# Patient Record
Sex: Male | Born: 1960 | Race: White | Hispanic: No | Marital: Single | Smoking: Former smoker
Health system: Southern US, Community
[De-identification: ages and names within clinical notes are randomized; demographics above are authoritative.]

## PROBLEM LIST (undated history)

## (undated) DIAGNOSIS — I10 Essential (primary) hypertension: Secondary | ICD-10-CM

## (undated) DIAGNOSIS — E7800 Pure hypercholesterolemia, unspecified: Secondary | ICD-10-CM

## (undated) DIAGNOSIS — Q2381 Bicuspid aortic valve: Secondary | ICD-10-CM

## (undated) DIAGNOSIS — F32A Depression, unspecified: Secondary | ICD-10-CM

## (undated) DIAGNOSIS — Q231 Congenital insufficiency of aortic valve: Secondary | ICD-10-CM

## (undated) HISTORY — DX: Depression, unspecified: F32.A

## (undated) HISTORY — PX: CARDIAC CATHETERIZATION: SHX172

---

## 2012-06-13 ENCOUNTER — Emergency Department (HOSPITAL_COMMUNITY): Payer: Worker's Compensation

## 2012-06-13 ENCOUNTER — Encounter (HOSPITAL_COMMUNITY): Payer: Self-pay | Admitting: *Deleted

## 2012-06-13 ENCOUNTER — Emergency Department (HOSPITAL_COMMUNITY)
Admission: EM | Admit: 2012-06-13 | Discharge: 2012-06-13 | Disposition: A | Discharge status: Home / Self Care | Payer: Worker's Compensation | Attending: Emergency Medicine | Admitting: Emergency Medicine

## 2012-06-13 DIAGNOSIS — S59909A Unspecified injury of unspecified elbow, initial encounter: Secondary | ICD-10-CM | POA: Insufficient documentation

## 2012-06-13 DIAGNOSIS — S4980XA Other specified injuries of shoulder and upper arm, unspecified arm, initial encounter: Secondary | ICD-10-CM | POA: Insufficient documentation

## 2012-06-13 DIAGNOSIS — W1789XA Other fall from one level to another, initial encounter: Secondary | ICD-10-CM | POA: Insufficient documentation

## 2012-06-13 DIAGNOSIS — Z7982 Long term (current) use of aspirin: Secondary | ICD-10-CM | POA: Insufficient documentation

## 2012-06-13 DIAGNOSIS — Z79899 Other long term (current) drug therapy: Secondary | ICD-10-CM | POA: Insufficient documentation

## 2012-06-13 DIAGNOSIS — Y9389 Activity, other specified: Secondary | ICD-10-CM | POA: Insufficient documentation

## 2012-06-13 DIAGNOSIS — E7800 Pure hypercholesterolemia, unspecified: Secondary | ICD-10-CM | POA: Insufficient documentation

## 2012-06-13 DIAGNOSIS — S6990XA Unspecified injury of unspecified wrist, hand and finger(s), initial encounter: Secondary | ICD-10-CM | POA: Insufficient documentation

## 2012-06-13 DIAGNOSIS — Y9289 Other specified places as the place of occurrence of the external cause: Secondary | ICD-10-CM | POA: Insufficient documentation

## 2012-06-13 DIAGNOSIS — S199XXA Unspecified injury of neck, initial encounter: Secondary | ICD-10-CM | POA: Insufficient documentation

## 2012-06-13 DIAGNOSIS — S32009A Unspecified fracture of unspecified lumbar vertebra, initial encounter for closed fracture: Secondary | ICD-10-CM | POA: Insufficient documentation

## 2012-06-13 DIAGNOSIS — S0993XA Unspecified injury of face, initial encounter: Secondary | ICD-10-CM | POA: Insufficient documentation

## 2012-06-13 DIAGNOSIS — Q231 Congenital insufficiency of aortic valve: Secondary | ICD-10-CM | POA: Insufficient documentation

## 2012-06-13 DIAGNOSIS — S46909A Unspecified injury of unspecified muscle, fascia and tendon at shoulder and upper arm level, unspecified arm, initial encounter: Secondary | ICD-10-CM | POA: Insufficient documentation

## 2012-06-13 DIAGNOSIS — I10 Essential (primary) hypertension: Secondary | ICD-10-CM | POA: Insufficient documentation

## 2012-06-13 HISTORY — DX: Essential (primary) hypertension: I10

## 2012-06-13 HISTORY — DX: Bicuspid aortic valve: Q23.81

## 2012-06-13 HISTORY — DX: Congenital insufficiency of aortic valve: Q23.1

## 2012-06-13 HISTORY — DX: Pure hypercholesterolemia, unspecified: E78.00

## 2012-06-13 LAB — CBC WITH DIFFERENTIAL/PLATELET
Basophils Absolute: 0 10*3/uL (ref 0.0–0.1)
Eosinophils Absolute: 0.1 10*3/uL (ref 0.0–0.7)
Lymphocytes Relative: 13 % (ref 12–46)
Lymphs Abs: 1.1 10*3/uL (ref 0.7–4.0)
Neutrophils Relative %: 81 % — ABNORMAL HIGH (ref 43–77)
Platelets: 210 10*3/uL (ref 150–400)
RBC: 4.21 MIL/uL — ABNORMAL LOW (ref 4.22–5.81)
RDW: 12.6 % (ref 11.5–15.5)
WBC: 8.5 10*3/uL (ref 4.0–10.5)

## 2012-06-13 LAB — POCT I-STAT, CHEM 8
BUN: 33 mg/dL — ABNORMAL HIGH (ref 6–23)
Creatinine, Ser: 1 mg/dL (ref 0.50–1.35)
Hemoglobin: 14.6 g/dL (ref 13.0–17.0)
Potassium: 3.8 mEq/L (ref 3.5–5.1)
Sodium: 138 mEq/L (ref 135–145)

## 2012-06-13 MED ORDER — ONDANSETRON HCL 4 MG/2ML IJ SOLN
4.0000 mg | Freq: Once | INTRAMUSCULAR | Status: AC
  Administered 2012-06-13: 4 mg via INTRAVENOUS
  Filled 2012-06-13: qty 2

## 2012-06-13 MED ORDER — HYDROMORPHONE HCL PF 1 MG/ML IJ SOLN
1.0000 mg | Freq: Once | INTRAMUSCULAR | Status: AC
  Administered 2012-06-13: 1 mg via INTRAMUSCULAR
  Filled 2012-06-13: qty 1

## 2012-06-13 MED ORDER — ONDANSETRON 4 MG PO TBDP
8.0000 mg | ORAL_TABLET | Freq: Once | ORAL | Status: AC
  Administered 2012-06-13: 8 mg via ORAL

## 2012-06-13 MED ORDER — ONDANSETRON 4 MG PO TBDP
ORAL_TABLET | ORAL | Status: AC
  Filled 2012-06-13: qty 2

## 2012-06-13 MED ORDER — ONDANSETRON HCL 4 MG PO TABS: 4.0000 mg | ORAL_TABLET | Freq: Three times a day (TID) | ORAL | Status: DC | PRN

## 2012-06-13 MED ORDER — HYDROMORPHONE HCL PF 1 MG/ML IJ SOLN
1.0000 mg | Freq: Once | INTRAMUSCULAR | Status: AC
  Administered 2012-06-13: 1 mg via INTRAVENOUS
  Filled 2012-06-13: qty 1

## 2012-06-13 MED ORDER — OXYCODONE-ACETAMINOPHEN 5-325 MG PO TABS: 1.0000 | ORAL_TABLET | Freq: Four times a day (QID) | ORAL | Status: DC | PRN

## 2012-06-13 MED ORDER — IOHEXOL 300 MG/ML  SOLN
100.0000 mL | Freq: Once | INTRAMUSCULAR | Status: AC | PRN
  Administered 2012-06-13: 100 mL via INTRAVENOUS

## 2012-06-13 MED ORDER — OXYCODONE-ACETAMINOPHEN 5-325 MG PO TABS: 1.0000 | ORAL_TABLET | ORAL | Status: DC | PRN

## 2012-06-13 NOTE — ED Notes (Signed)
Ortho tech reports brace has to come from [facility] on [address] and will take 30-45 more minutes to arrive. Pt made aware. MD at bedside speaking with pt.

## 2012-06-13 NOTE — ED Notes (Signed)
Patient transported to CT 

## 2012-06-13 NOTE — ED Provider Notes (Signed)
I saw and evaluated the patient, reviewed the resident's note and I agree with the findings and plan.  Derwood Kaplan, MD 06/13/12 1625

## 2012-06-13 NOTE — ED Notes (Signed)
Paged ortho tech 

## 2012-06-13 NOTE — ED Notes (Signed)
Pt given urinal to use.

## 2012-06-13 NOTE — ED Provider Notes (Signed)
History     CSN: [id number]  Arrival date & time 06/13/12  1308   First MD Initiated Contact with Patient 06/13/12 [phone]      Chief Complaint  Patient presents with  . Fall  . Back Pain  . Neck Pain    (Consider location/radiation/quality/duration/timing/severity/associated sxs/prior treatment) Patient is a 51 y.o. male presenting with fall, back pain, and neck pain. The history is provided by the patient.  Fall The accident occurred less than 1 hour ago. Incident: from heavy machinery. He fell from a height of 3 to 5 ft. He landed on concrete. There was no blood loss. The point of impact was the left shoulder and left elbow. The pain is present in the left elbow, left shoulder and neck (chest). The pain is moderate. He was ambulatory at the scene. There was no entrapment after the fall. There was no drug use involved in the accident. There was no alcohol use involved in the accident. Pertinent negatives include no fever, no abdominal pain, no vomiting and no loss of consciousness. Treatment on scene includes a c-collar and a backboard.  Back Pain  Pertinent negatives include no fever and no abdominal pain.  Neck Pain     Past Medical History  Diagnosis Date  . Hypertension   . Bicuspid aortic valve   . Hypercholesteremia     No past surgical history on file.  No family history on file.  History  Substance Use Topics  . Smoking status: Not on file  . Smokeless tobacco: Not on file  . Alcohol Use:       Review of Systems  Constitutional: Negative for fever and chills.  HENT: Positive for neck pain.   Respiratory: Negative for cough and shortness of breath.   Gastrointestinal: Negative for vomiting and abdominal pain.  Musculoskeletal: Positive for back pain.  Neurological: Negative for loss of consciousness.  All other systems reviewed and are negative.    Allergies  Review of patient's allergies indicates no known allergies.  Home Medications   Current  Outpatient Rx  Name  Route  Sig  Dispense  Refill  . AMLODIPINE BESYLATE 5 MG PO TABS   Oral   Take 5 mg by mouth daily.         . ASPIRIN 81 MG PO CHEW   Oral   Chew 81 mg by mouth daily.         Marland Kitchen LORATADINE 10 MG PO TABS   Oral   Take 10 mg by mouth daily.         Marland Kitchen POLYETHYLENE GLYCOL 3350 PO PACK   Oral   Take 17 g by mouth daily.         . QUINAPRIL HCL 20 MG PO TABS   Oral   Take 20 mg by mouth daily.         Marland Kitchen ROPINIROLE HCL 0.5 MG PO TABS   Oral   Take 0.5 mg by mouth at bedtime.           BP 166/104  Pulse 94  Temp 98.4 F (36.9 C) (Oral)  Resp 20  SpO2 100%  Physical Exam  Nursing note and vitals reviewed. Constitutional: He is oriented to person, place, and time. He appears well-developed and well-nourished. No distress.  HENT:  Head: Normocephalic and atraumatic.  Mouth/Throat: No oropharyngeal exudate.  Eyes: EOM are normal. Pupils are equal, round, and reactive to light.  Neck: Normal range of motion. Neck supple.  Cardiovascular: Normal  rate and regular rhythm.  Exam reveals no friction rub.   No murmur heard. Pulmonary/Chest: Effort normal and breath sounds normal. No respiratory distress. He has no wheezes. He has no rales.  Abdominal: He exhibits no distension. There is no tenderness. There is no rebound.  Musculoskeletal: He exhibits no edema.       Left shoulder: He exhibits bony tenderness.       Left elbow: He exhibits decreased range of motion and swelling (mild). tenderness (diffuse) found.       Cervical back: He exhibits bony tenderness (lower c-spine).       Lumbar back: He exhibits tenderness and bony tenderness. He exhibits normal range of motion, no laceration and normal pulse.  Neurological: He is alert and oriented to person, place, and time.  Skin: He is not diaphoretic.    ED Course  Procedures (including critical care time)  Labs Reviewed - No data to display Dg Chest 1 View  06/13/2012  *RADIOLOGY REPORT*   Clinical Data: Posterior left shoulder pain and back pain secondary to a fall.  CHEST - 1 VIEW  Comparison: None.  Findings: The heart size and pulmonary vascularity are normal and the lungs are clear.  No acute osseous abnormality.  IMPRESSION: Normal chest.   Original Report Authenticated By: Francene Boyers, M.D.    Dg Lumbar Spine 2-3 Views  06/13/2012  *RADIOLOGY REPORT*  Clinical Data: Back pain secondary to a fall.  LUMBAR SPINE - 2-3 VIEW  Comparison: CT scan of the abdomen and pelvis dated 09/21/2010  Findings: There is no fracture, subluxation, disc space narrowing, or other abnormality.  IMPRESSION: Normal exam.   Original Report Authenticated By: Francene Boyers, M.D.    Dg Pelvis 1-2 Views  06/13/2012  *RADIOLOGY REPORT*  Clinical Data: Injury post fall  PELVIS - 1-2 VIEW  Comparison: None.  Findings: Single frontal view of the pelvis submitted.  No acute fracture or subluxation.  IMPRESSION: No acute fracture or subluxation.   Original Report Authenticated By: Natasha Mead, M.D.    Dg Elbow 2 Views Left  06/13/2012  *RADIOLOGY REPORT*  Clinical Data: Pain post trauma  LEFT ELBOW - 2 VIEW  Comparison: None.  Findings: Frontal and lateral views were obtained.  No fracture, dislocation, or effusion.  Joint spaces appear intact.  No erosive change.  IMPRESSION: No abnormality noted.   Original Report Authenticated By: Bretta Bang, M.D.    Ct Head Wo Contrast  06/13/2012  *RADIOLOGY REPORT*  Clinical Data: Back and neck pain secondary to a fall.  CT HEAD WITHOUT CONTRAST  Technique:  Contiguous axial images were obtained from the base of the skull through the vertex without contrast.  Comparison: None.  Findings: There is no acute intracranial hemorrhage, infarction, or mass lesion.  Brain parenchyma is normal.  Osseous structures are normal except for chronic mucosal thickening of the right maxillary sinus.  IMPRESSION: No acute abnormalities.   Original Report Authenticated By: Francene Boyers,  M.D.    Ct Chest W Contrast  06/13/2012  *RADIOLOGY REPORT*  Clinical Data:  Fall.  Concern for possible rib fracture.  Back pain.  CT CHEST, ABDOMEN AND PELVIS WITH CONTRAST  Technique:  Multidetector CT imaging of the chest, abdomen and pelvis was performed following the standard protocol during bolus administration of intravenous contrast.  Contrast: OMNIPAQUE IOHEXOL 300 MG/ML  SOLN  Comparison:  Chest radiograph 06/13/2012  CT CHEST  Findings:  Contrast injection was view of the right upper extremity.  The  right subclavian vein demonstrates a narrowed course, and there are collateral vessels which enhance with contrast, in the upper right back.  Superior vena cava appears normal in caliber.  Mild cardiomegaly.  Aortic valvular calcifications are noted.  The patient reportedly has a bicuspid aortic valve per guide PICC. There are stenosis cannot be excluded.  Heart size is mildly enlarged.  Thoracic aorta is normal in caliber and enhancement. Negative for aortic dissection.  The visualized thyroid gland within normal limits.  Negative for lymphadenopathy in the chest.  Esophagus is unremarkable.  Negative for pleural or pericardial effusion.  The trachea and mainstem bronchi are patent.  Negative for pneumothorax.  Mild dependent atelectasis in both lower lobes. Negative for airspace disease, interstitial abnormality, or pulmonary mass.  Soft tissues of the chest wall appears symmetric.  No evidence of chest wall trauma.  The ribs, visualized portions of the scapula and clavicles, and sternum are intact.  The thoracic spine vertebral bodies are normal in height and alignment.  IMPRESSION:  1.  No evidence of acute trauma to the chest. 2.  Aortic valve calcifications.  In combination with the patient's reported history of bicuspid aortic valve, findings are concerning for the possibility of aortic stenosis. 3.  Mild cardiomegaly  CT ABDOMEN AND PELVIS  Findings:  There are acute fractures of the left  transverse processes of L1, L2, L3, and L4.  The lumbar spine vertebral bodies are normal in height and alignment.  The bony pelvis is intact.  There is irregular stranding and fullness in the subcutaneous fat of the lower left flank, just cephalad to the superior aspect of the left iliac crest, consistent with bruising/hematoma.  The liver, gallbladder, spleen, adrenal glands, pancreas, and kidneys show no evidence of acute trauma or mass.  There is a slight rotation anomaly of the kidneys, an anatomic variant.  Both kidneys are normal in size.  There is no hydronephrosis.  The ureters are normal in caliber.  The urinary bladder is moderately distended and demonstrates normal wall thickness.  Prostate gland appears upper normal in contains some calcifications centrally.  The stomach is fairly decompressed and unremarkable.  Small bowel loops are normal in caliber wall thickness.  Prominent amount of stool throughout the colon raises possibility of constipation, suggest clinical correlation.  Rectum unremarkable.  Normal appendix.  Psoas muscles appear normal.  Mesenteries of the bowel are normal.  Abdominal aorta has a tortuous course and is normal in caliber. Scattered foci of atherosclerotic calcification in the aorta and iliac vasculature.  Negative for free air or free fluid.  Anterior abdominal wall is intact.  Inguinal regions unremarkable.  IMPRESSION:  1.  Acute fractures of the left transverse processes of L1, L2, L3, and L4. 2.  Bruising/hematoma of the subcutaneous fat of the inferior left flank, just cephalad to the iliac crest. 3.  No evidence of acute abdominal or pelvic visceral trauma.   Original Report Authenticated By: Britta Mccreedy, M.D.    Ct Cervical Spine Wo Contrast  06/13/2012  *RADIOLOGY REPORT*  Clinical Data: Neck pain secondary to a fall.  CT CERVICAL SPINE WITHOUT CONTRAST  Technique:  Multidetector CT imaging of the cervical spine was performed. Multiplanar CT image reconstructions  were also generated.  Comparison: None.  Findings: There is no fracture, subluxation, prevertebral soft tissue swelling, or other acute abnormality.  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  IMPRESSION: No acute abnormality of the cervical spine.   Original Report Authenticated By: Francene Boyers, M.D.  Ct Abdomen Pelvis W Contrast  06/13/2012  *RADIOLOGY REPORT*  Clinical Data:  Fall.  Concern for possible rib fracture.  Back pain.  CT CHEST, ABDOMEN AND PELVIS WITH CONTRAST  Technique:  Multidetector CT imaging of the chest, abdomen and pelvis was performed following the standard protocol during bolus administration of intravenous contrast.  Contrast: OMNIPAQUE IOHEXOL 300 MG/ML  SOLN  Comparison:  Chest radiograph 06/13/2012  CT CHEST  Findings:  Contrast injection was view of the right upper extremity.  The right subclavian vein demonstrates a narrowed course, and there are collateral vessels which enhance with contrast, in the upper right back.  Superior vena cava appears normal in caliber.  Mild cardiomegaly.  Aortic valvular calcifications are noted.  The patient reportedly has a bicuspid aortic valve per guide PICC. There are stenosis cannot be excluded.  Heart size is mildly enlarged.  Thoracic aorta is normal in caliber and enhancement. Negative for aortic dissection.  The visualized thyroid gland within normal limits.  Negative for lymphadenopathy in the chest.  Esophagus is unremarkable.  Negative for pleural or pericardial effusion.  The trachea and mainstem bronchi are patent.  Negative for pneumothorax.  Mild dependent atelectasis in both lower lobes. Negative for airspace disease, interstitial abnormality, or pulmonary mass.  Soft tissues of the chest wall appears symmetric.  No evidence of chest wall trauma.  The ribs, visualized portions of the scapula and clavicles, and sternum are intact.  The thoracic spine vertebral bodies are normal in height and alignment.  IMPRESSION:  1.  No  evidence of acute trauma to the chest. 2.  Aortic valve calcifications.  In combination with the patient's reported history of bicuspid aortic valve, findings are concerning for the possibility of aortic stenosis. 3.  Mild cardiomegaly  CT ABDOMEN AND PELVIS  Findings:  There are acute fractures of the left transverse processes of L1, L2, L3, and L4.  The lumbar spine vertebral bodies are normal in height and alignment.  The bony pelvis is intact.  There is irregular stranding and fullness in the subcutaneous fat of the lower left flank, just cephalad to the superior aspect of the left iliac crest, consistent with bruising/hematoma.  The liver, gallbladder, spleen, adrenal glands, pancreas, and kidneys show no evidence of acute trauma or mass.  There is a slight rotation anomaly of the kidneys, an anatomic variant.  Both kidneys are normal in size.  There is no hydronephrosis.  The ureters are normal in caliber.  The urinary bladder is moderately distended and demonstrates normal wall thickness.  Prostate gland appears upper normal in contains some calcifications centrally.  The stomach is fairly decompressed and unremarkable.  Small bowel loops are normal in caliber wall thickness.  Prominent amount of stool throughout the colon raises possibility of constipation, suggest clinical correlation.  Rectum unremarkable.  Normal appendix.  Psoas muscles appear normal.  Mesenteries of the bowel are normal.  Abdominal aorta has a tortuous course and is normal in caliber. Scattered foci of atherosclerotic calcification in the aorta and iliac vasculature.  Negative for free air or free fluid.  Anterior abdominal wall is intact.  Inguinal regions unremarkable.  IMPRESSION:  1.  Acute fractures of the left transverse processes of L1, L2, L3, and L4. 2.  Bruising/hematoma of the subcutaneous fat of the inferior left flank, just cephalad to the iliac crest. 3.  No evidence of acute abdominal or pelvic visceral trauma.   Original  Report Authenticated By: Britta Mccreedy, M.D.  Dg Shoulder Left  06/13/2012  *RADIOLOGY REPORT*  Clinical Data: Fall, left shoulder pain  LEFT SHOULDER - 2+ VIEW  Comparison: None.  Findings: Three views of the left shoulder submitted.  No acute fracture or subluxation.  Minimal degenerative changes left AC joint.  IMPRESSION: No acute fracture or subluxation.  Minimal degenerative changes left AC joint.   Original Report Authenticated By: Natasha Mead, M.D.      1. Closed fracture of transverse process of lumbar vertebra       MDM   51 year old male with history of hypertension, bicuspid aortic valve, and hyperlipidemia presents after a fall. Patient fell backwards off machinery onto a concrete floor. He did not lose consciousness his head. He is complaining of left elbow, left shoulder, left upper back, right shoulder, lower back pain. His vitals are stable here. He is not hypoxic. He is in a fair pain. On exam, patient has normal breath sounds his chest. He has mild left-sided chest tenderness. Mild lower c-spine tenderness. He has tenderness to left shoulder left elbow, however no major deformities are noted. He has good pulses in all of his extremities. He has abrasions to that left elbow. He has left posterior shoulder, scapula and lower back pain on palpation. Will be CT scan of his head and neck. The plain films of his chest, left elbow, left shoulder, L. spine, pelvis. Initial x-rays negative. Patient still having abdominal and back pain. Will pursue CT scan of chest abdomen pelvis. The scans showed left-sided the transverse process fractures on the first through fourth lumbar vertebrae. Neurosurgery consult it and recommended lumbar spinal orthotic brace. There is ordered. Patient given pain medicine to go home with and a note off work. Patient given neurosurgery followup in one to 2 weeks.  Elwin Mocha, MD 06/13/12 [phone]

## 2012-06-13 NOTE — ED Notes (Signed)
Pt fell apprximately 3-4 feet at work off of a machinery. Pt fell flat on back. Pt c/o lower back pain and neck pain. No LOC. No blood thinners. Small abrasion to left elbow.

## 2012-06-13 NOTE — Progress Notes (Signed)
Orthopedic Tech Progress Note Patient Details:  Craig Alvarez 1960-08-15 [id number]  Patient ID: Craig Alvarez, male   DOB: 08/05/1960, 51 y.o.   MRN: [id number]   Shawnie Pons 06/13/2012, 2:24 PM CALLED [facility] FOR [facility].

## 2012-06-13 NOTE — ED Notes (Signed)
Patient transported to X-ray 

## 2012-06-13 NOTE — ED Notes (Signed)
Pt taken to restroom to obtain specimen.

## 2012-06-13 NOTE — ED Notes (Signed)
Pt remains in Radiology, will assess pain level when pt returns.

## 2013-07-16 IMAGING — CT CT CERVICAL SPINE W/O CM
2 of 6 series · 5 of 20 positions shown, 6 images · non-contrast
Comparison: None.

CLINICAL DATA: Neck pain secondary to a fall.

CT CERVICAL SPINE WITHOUT CONTRAST
TECHNIQUE: Multidetector CT imaging of the cervical spine was
performed. Multiplanar CT image reconstructions were also
generated.

[Series 5: recon 2: c-spine · axial · 0.23mm/px · z∈[-264,-202]mm · 2 of 76 slices shown, 3 images]
[im 26/76  soft-tissue]
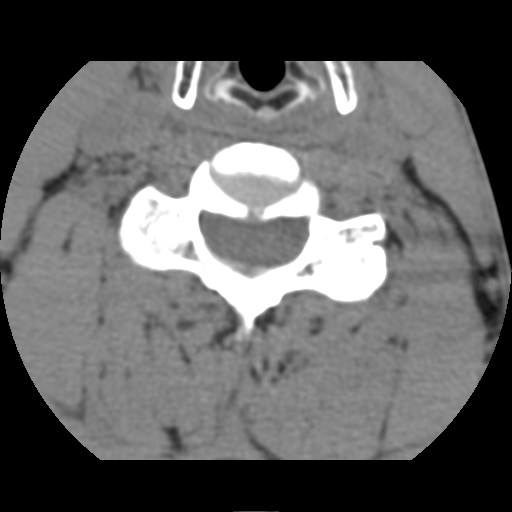
[im 26/76  bone]
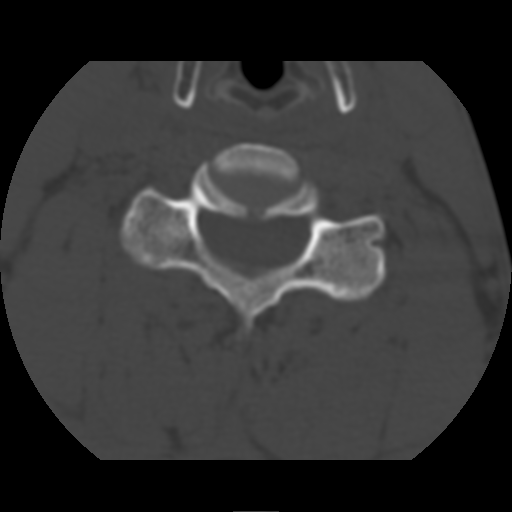
[im 51/76  bone]
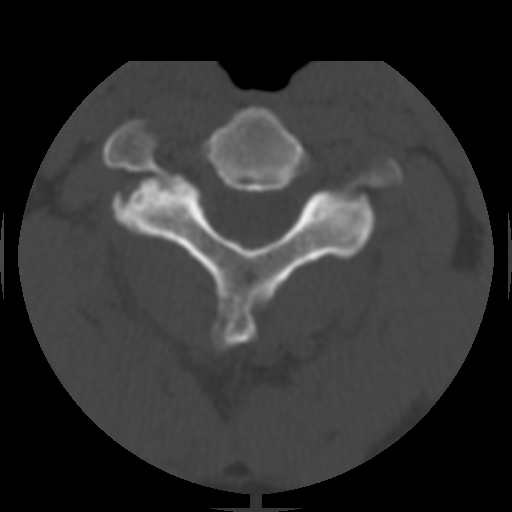

[Series 601: coronals · coronal · 0.38mm/px · 3 of 30 slices shown]
[im 6/30  bone]
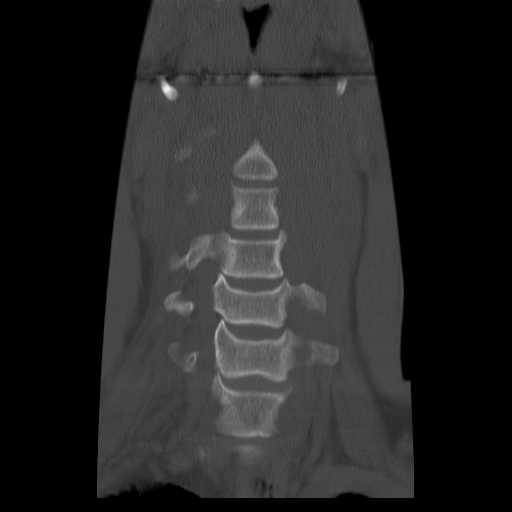
[im 12/30  bone]
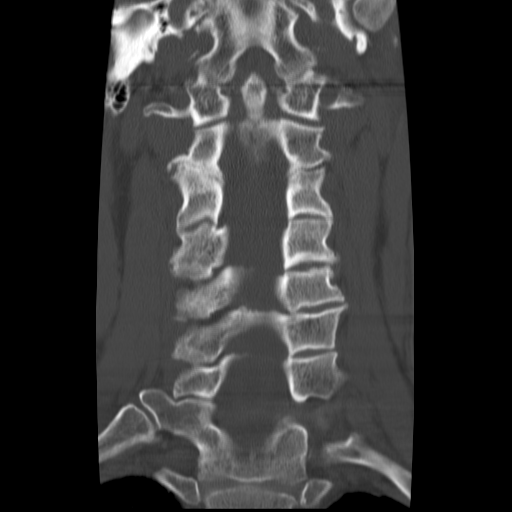
[im 18/30  bone]
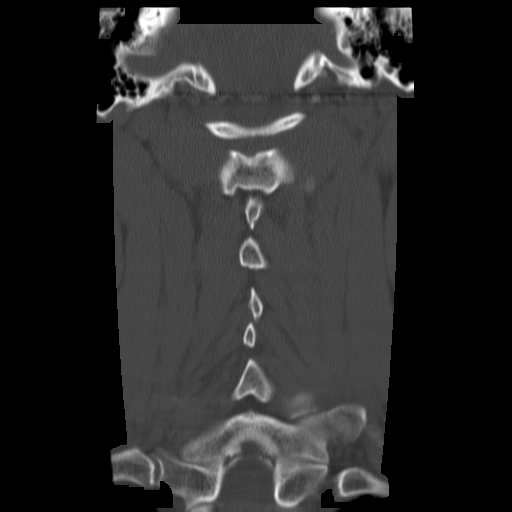

[5 of 20 positions shown; findings below may reference images not displayed]

FINDINGS: There is no fracture, subluxation, prevertebral soft
tissue swelling, or other acute abnormality.  Moderate right facet
arthritis at C2-3.
IMPRESSION: No acute abnormality of the cervical spine.

## 2015-11-04 DIAGNOSIS — R7303 Prediabetes: Secondary | ICD-10-CM | POA: Insufficient documentation

## 2015-11-04 DIAGNOSIS — Z9889 Other specified postprocedural states: Secondary | ICD-10-CM

## 2015-11-04 DIAGNOSIS — N529 Male erectile dysfunction, unspecified: Secondary | ICD-10-CM | POA: Insufficient documentation

## 2015-11-04 HISTORY — DX: Male erectile dysfunction, unspecified: N52.9

## 2015-11-04 HISTORY — DX: Other specified postprocedural states: Z98.890

## 2015-11-04 HISTORY — DX: Prediabetes: R73.03

## 2015-12-21 DIAGNOSIS — I350 Nonrheumatic aortic (valve) stenosis: Secondary | ICD-10-CM

## 2015-12-21 DIAGNOSIS — E785 Hyperlipidemia, unspecified: Secondary | ICD-10-CM

## 2015-12-21 HISTORY — DX: Nonrheumatic aortic (valve) stenosis: I35.0

## 2015-12-21 HISTORY — DX: Hyperlipidemia, unspecified: E78.5

## 2017-12-12 DIAGNOSIS — G8929 Other chronic pain: Secondary | ICD-10-CM

## 2017-12-12 DIAGNOSIS — M545 Low back pain, unspecified: Secondary | ICD-10-CM | POA: Insufficient documentation

## 2017-12-12 HISTORY — DX: Other chronic pain: G89.29

## 2017-12-12 HISTORY — DX: Low back pain, unspecified: M54.50

## 2018-07-10 HISTORY — PX: KNEE ARTHROSCOPY WITH MENISCAL REPAIR: SHX5653

## 2018-11-20 DIAGNOSIS — M25369 Other instability, unspecified knee: Secondary | ICD-10-CM | POA: Insufficient documentation

## 2018-11-20 HISTORY — DX: Other instability, unspecified knee: M25.369

## 2018-12-27 DIAGNOSIS — S83209A Unspecified tear of unspecified meniscus, current injury, unspecified knee, initial encounter: Secondary | ICD-10-CM | POA: Insufficient documentation

## 2018-12-27 HISTORY — DX: Unspecified tear of unspecified meniscus, current injury, unspecified knee, initial encounter: S83.209A

## 2019-01-19 NOTE — Progress Notes (Signed)
Cardiology Office Note:    Date:  01/21/2019   ID:  Craig BashMark Alvarez, DOB 10/04/1960, MRN [id number]  PCP:  Gordan PaymentGrisso, Greg A., MD  Cardiologist:  Norman HerrlichBrian Ovadia Lopp, MD   Referring MD: Gordan PaymentGrisso, Greg A., MD  ASSESSMENT:    1. Pre-operative cardiovascular examination   2. Bicuspid aortic valve   3. Essential hypertension   4. Mixed hyperlipidemia    PLAN:    In order of problems listed above:  1. Pre-operative cardiovascular examination -the planned surgery is elective low risk and by physical exam his aortic stenosis is not severe.  With abnormal EKG I would like him to repeat an echom only with aortic stenosis of critical nature would advise canceling surgery.  He is being seen after 5 PM and were doing our best to get this scheduled for tomorrow. 2. Biscuspid aortic valve - Last echo with mild stenosis 2017.  Continue to follow endocarditis prophylaxis if his aorta is enlarged on echo he will need a CT screen of his chest for aortopathy HTN - Followed by his PCP.  Continue current treatment including calcium channel blocker beta-blocker 3. HLD - Followed by his PCP. Lipid panel 12/20/18 total cholesterol 253, HDL 46, LDL 130, Triglycerides 382.  Stable continue lipid-lowering therapy  Next appointment: 1 year   Medication Adjustments/Labs and Tests Ordered: Current medicines are reviewed at length with the patient today.  Concerns regarding medicines are outlined above.  No orders of the defined types were placed in this encounter.  No orders of the defined types were placed in this encounter.    Chief Complaint  Patient presents with  . Pre-op Exam   Chief Complaint: 58 yo male presents to re-establish cardiac care for pre-operative cardiac clearance of knee surgery in the setting of bicuspid aortic valve.   History of Present Illness:    Craig Alvarez is a 58 y.o. male a history of hypertension hyperlipidemia and a bicuspid aortic valve who is being seen today for preoperative  cardiovascular evaluation prior to arthroscopic knee surgery at the request of Gordan PaymentGrisso, Greg A., MD.  There is a [facility]/06/2016 that echocardiogram showed a bicuspid valve and only mild stenosis.  He is advised to follow endocarditis prophylaxis.   He follows closely with Dr. Shary DecampGrisso for HTN, HLD, and prediabetes. Additional PMH includes anxiety, insomnia, RLS.   Tells me he still takes his prophylactic antiobiotics prior to dental procedures.   He is set for surgery Friday.  From his description is having arthroscopic surgery for torn meniscus.  He is unsure what anesthesia he received.  Despite the limitations of pain from the knee and walking in a brace he still is doing garden work his exercise tolerance exceeds 5-7 METS.  He follows endocarditis prophylaxis and has had no angina dyspnea or syncope.  His physical examination suggest more than mild aortic stenosis and I will do my best to have an echocardiogram done tomorrow although his physical examination does not suggest severe stenosis.  He has had no fever chills or signs of appropriate 19   Past Medical History:  Diagnosis Date  . Bicuspid aortic valve   . Hypercholesteremia   . Hypertension     History reviewed. No pertinent surgical history.  Current Medications: Current Meds  Medication Sig  . amLODipine (NORVASC) 5 MG tablet Take 5 mg by mouth daily.  Marland Kitchen. aspirin 81 MG chewable tablet Chew 81 mg by mouth daily.  Marland Kitchen. atorvastatin (LIPITOR) 20 MG tablet atorvastatin 20  mg tablet  TAKE 1 TABLET BY MOUTH EVERY DAY  . Calcium-Vitamin D 500-125 MG-UNIT TABS Take 1 tablet by mouth daily.  Marland Kitchen loratadine (CLARITIN) 10 MG tablet Take 10 mg by mouth daily.  . Multiple Vitamin (MULTIVITAMIN) tablet Take 1 tablet by mouth daily.  . ondansetron (ZOFRAN) 4 MG tablet Take 1 tablet (4 mg total) by mouth every 8 (eight) hours as needed for nausea.  . polyethylene glycol (MIRALAX / GLYCOLAX) packet Take 17 g by mouth daily.   . quinapril (ACCUPRIL) 20 MG tablet Take 20 mg by mouth daily.  Marland Kitchen rOPINIRole (REQUIP) 0.5 MG tablet Take 0.5 mg by mouth at bedtime.     Allergies:   Patient has no known allergies.   Social History   Socioeconomic History  . Marital status: Single    Spouse name: Not on file  . Number of children: Not on file  . Years of education: Not on file  . Highest education level: Not on file  Occupational History  . Not on file  Social Needs  . Financial resource strain: Not on file  . Food insecurity    Worry: Not on file    Inability: Not on file  . Transportation needs    Medical: Not on file    Non-medical: Not on file  Tobacco Use  . Smoking status: Former Smoker    Types: Cigarettes    Quit date: 1999    Years since quitting: 21.5  . Smokeless tobacco: Never Used  Substance and Sexual Activity  . Alcohol use: Yes    Alcohol/week: 12.0 - 14.0 standard drinks    Types: 12 - 14 Glasses of wine per week  . Drug use: Yes    Types: Marijuana  . Sexual activity: Not on file  Lifestyle  . Physical activity    Days per week: Not on file    Minutes per session: Not on file  . Stress: Not on file  Relationships  . Social Herbalist on phone: Not on file    Gets together: Not on file    Attends religious service: Not on file    Active member of club or organization: Not on file    Attends meetings of clubs or organizations: Not on file    Relationship status: Not on file  Other Topics Concern  . Not on file  Social History Narrative  . Not on file     Family History: The patient's family history includes COPD in his mother; Cancer in his maternal grandmother; Heart attack in his father.  ROS:   ROS Please see the history of present illness.     All other systems reviewed and are negative.  EKGs/Labs/Other Studies Reviewed:    The following studies were reviewed today: No studies available for review.  EKG:  EKG is  ordered today.  The ekg ordered today  is personally reviewed and demonstrates sinus rhythm QS in V2 lead placement versus previous anterior septal MI  Recent Labs: Labs 12/20/18 via Care Everywhere:  A1c 5.7 Glucose 115  Na 137 K 4.2 Creatinine 0.85 AST 31 ALT 45   Recent Lipid Panel Lipid panel 12/20/18 via Care Everywhere: total cholesterol 253, HDL 46, LDL 130, Triglycerides 382.   Physical Exam:    VS:  BP 130/86 (BP Location: Left Arm, Patient Position: Sitting, Cuff Size: Large)   Pulse 98   Ht 6' (1.829 m)   Wt 225 lb (102.1 kg)   SpO2  96%   BMI 30.52 kg/m     Wt Readings from Last 3 Encounters:  01/21/19 225 lb (102.1 kg)     GEN:  Well nourished, well developed in no acute distress HEENT: Normal NECK: No JVD; No carotid bruits LYMPHATICS: No lymphadenopathy CARDIAC: He has a grade 2/6 to 3/6 mid peaking systolic ejection murmur aortic area up into the right clavicle does not radiate into the carotids and S2 is split.  There is no aortic regurgitation.  RRR,  rubs, gallops RESPIRATORY:  Clear to auscultation without rales, wheezing or rhonchi  ABDOMEN: Soft, non-tender, non-distended MUSCULOSKELETAL:  No edema; No deformity  SKIN: Warm and dry NEUROLOGIC:  Alert and oriented x 3 PSYCHIATRIC:  Normal affect     Signed, Norman HerrlichBrian Ellisha Bankson, MD  01/21/2019 5:11 PM     [facility]

## 2019-01-21 ENCOUNTER — Ambulatory Visit (INDEPENDENT_AMBULATORY_CARE_PROVIDER_SITE_OTHER): Payer: 59 | Admitting: Cardiology

## 2019-01-21 ENCOUNTER — Other Ambulatory Visit: Payer: Self-pay

## 2019-01-21 ENCOUNTER — Encounter: Payer: Self-pay | Admitting: Cardiology

## 2019-01-21 VITALS — BP 130/86 | HR 98 | Ht 72.0 in | Wt 225.0 lb

## 2019-01-21 DIAGNOSIS — I10 Essential (primary) hypertension: Secondary | ICD-10-CM

## 2019-01-21 DIAGNOSIS — Q2381 Bicuspid aortic valve: Secondary | ICD-10-CM

## 2019-01-21 DIAGNOSIS — F419 Anxiety disorder, unspecified: Secondary | ICD-10-CM

## 2019-01-21 DIAGNOSIS — E782 Mixed hyperlipidemia: Secondary | ICD-10-CM | POA: Diagnosis not present

## 2019-01-21 DIAGNOSIS — G4700 Insomnia, unspecified: Secondary | ICD-10-CM | POA: Insufficient documentation

## 2019-01-21 DIAGNOSIS — Q231 Congenital insufficiency of aortic valve: Secondary | ICD-10-CM

## 2019-01-21 DIAGNOSIS — G2581 Restless legs syndrome: Secondary | ICD-10-CM

## 2019-01-21 DIAGNOSIS — Z01810 Encounter for preprocedural cardiovascular examination: Secondary | ICD-10-CM

## 2019-01-21 HISTORY — DX: Insomnia, unspecified: G47.00

## 2019-01-21 HISTORY — DX: Restless legs syndrome: G25.81

## 2019-01-21 HISTORY — DX: Anxiety disorder, unspecified: F41.9

## 2019-01-21 HISTORY — DX: Mixed hyperlipidemia: E78.2

## 2019-01-21 HISTORY — DX: Essential (primary) hypertension: I10

## 2019-01-21 NOTE — Patient Instructions (Addendum)
Medication Instructions:  No changes to medication.  If you need a refill on your cardiac medications before your next appointment, please call your pharmacy.   Lab work: None today.  If you have labs (blood work) drawn today and your tests are completely normal, you will receive your results only by: Marland Kitchen MyChart Message (if you have MyChart) OR . A paper copy in the mail If you have any lab test that is abnormal or we need to change your treatment, we will call you to review the results.  Testing/Procedures: Echocardiogram.  Follow-Up: At [facility], you and your health needs are our priority.  As part of our continuing mission to provide you with exceptional heart care, we have created designated Provider Care Teams.  These Care Teams include your primary Cardiologist (physician) and Advanced Practice Providers (APPs -  Physician Assistants and Nurse Practitioners) who all work together to provide you with the care you need, when you need it. . You will need a follow up appointment in 1 year with Dr. Bettina Gavia.   Any Other Special Instructions Will Be Listed Below (If Applicable).  It was a pleasure to meet you today!

## 2019-01-22 ENCOUNTER — Telehealth: Payer: Self-pay | Admitting: *Deleted

## 2019-01-22 ENCOUNTER — Ambulatory Visit (HOSPITAL_COMMUNITY)
Admission: RE | Admit: 2019-01-22 | Discharge: 2019-01-22 | Disposition: A | Discharge status: Home / Self Care | Payer: 59 | Source: Ambulatory Visit | Attending: Cardiology | Admitting: Cardiology

## 2019-01-22 DIAGNOSIS — Q231 Congenital insufficiency of aortic valve: Secondary | ICD-10-CM | POA: Insufficient documentation

## 2019-01-22 DIAGNOSIS — Z01810 Encounter for preprocedural cardiovascular examination: Secondary | ICD-10-CM | POA: Insufficient documentation

## 2019-01-22 NOTE — Telephone Encounter (Signed)
Telephone call to patient. Reached him this time. Informed of echo appointment

## 2019-01-22 NOTE — Telephone Encounter (Signed)
Telephone call to patient . Left message that his echo has been scheduled at [facility] at 3:15 and to go to entrance C. Told to call back to be sure he got the message.

## 2019-01-22 NOTE — Progress Notes (Signed)
  Echocardiogram 2D Echocardiogram has been performed.  Craig Alvarez 01/22/2019, 4:07 PM

## 2020-04-26 DIAGNOSIS — U071 COVID-19: Secondary | ICD-10-CM | POA: Insufficient documentation

## 2020-04-26 DIAGNOSIS — Z8616 Personal history of COVID-19: Secondary | ICD-10-CM

## 2020-04-26 HISTORY — DX: Personal history of COVID-19: Z86.16

## 2020-04-26 HISTORY — DX: COVID-19: U07.1

## 2021-02-13 NOTE — Progress Notes (Deleted)
Cardiology Office Note:    Date:  02/13/2021   ID:  Craig Alvarez, DOB [birth year], MRN [id number]  PCP:  Gordan Payment., MD  Cardiologist:  Norman Herrlich, MD    Referring MD: Gordan Payment., MD    ASSESSMENT:    No diagnosis found. PLAN:    In order of problems listed above:  ***   Next appointment: ***   Medication Adjustments/Labs and Tests Ordered: Current medicines are reviewed at length with the patient today.  Concerns regarding medicines are outlined above.  No orders of the defined types were placed in this encounter.  No orders of the defined types were placed in this encounter.   No chief complaint on file.   History of Present Illness:    Craig Alvarez is a 60 y.o. male with a hx of bicuspid aortic valve last seen 01/21/2019.  There is a notation in cardiology notes 03/05/2016, my partner Dr. Bing Matter that at that time he had mild aortic stenosis mean gradient 17 mmHg Compliance with diet, lifestyle and medications: ***  Is seen today after recent episode of near loss of consciousness.  There is notation is seen by EMS at his home but does not go to the hospital. And labs performed 0874 2022 including magnesium normal 2.3 TSH normal 2.84 CMP with a sodium 139 potassium 4.2 creatinine 0.83 GFR greater than 90 cc hemoglobin 14.4 hematocrit 42.4 platelets 219,000. There is notation of an EKG performed that day no results available in Care Everywhere Past Medical History:  Diagnosis Date   Bicuspid aortic valve    Hypercholesteremia    Hypertension     No past surgical history on file.  Current Medications: No outpatient medications have been marked as taking for the 02/14/21 encounter (Appointment) with Baldo Daub, MD.     Allergies:   Patient has no known allergies.   Social History   Socioeconomic History   Marital status: Single    Spouse name: Not on file   Number of children: Not on file   Years of education: Not on file   Highest  education level: Not on file  Occupational History   Not on file  Tobacco Use   Smoking status: Former    Types: Cigarettes    Quit date: 1999    Years since quitting: 23.6   Smokeless tobacco: Never  Vaping Use   Vaping Use: Never used  Substance and Sexual Activity   Alcohol use: Yes    Alcohol/week: 12.0 - 14.0 standard drinks    Types: 12 - 14 Glasses of wine per week   Drug use: Yes    Types: Marijuana   Sexual activity: Not on file  Other Topics Concern   Not on file  Social History Narrative   Not on file   Social Determinants of Health   Financial Resource Strain: Not on file  Food Insecurity: Not on file  Transportation Needs: Not on file  Physical Activity: Not on file  Stress: Not on file  Social Connections: Not on file     Family History: The patient's ***family history includes COPD in his mother; Cancer in his maternal grandmother; Heart attack in his father. ROS:   Please see the history of present illness.    All other systems reviewed and are negative.  EKGs/Labs/Other Studies Reviewed:    The following studies were reviewed today:  EKG:  EKG ordered today and personally reviewed.  The ekg ordered today demonstrates ***  Recent  Labs: No results found for requested labs within last 8760 hours.  Recent Lipid Panel No results found for: CHOL, TRIG, HDL, CHOLHDL, VLDL, LDLCALC, LDLDIRECT  Physical Exam:    VS:  There were no vitals taken for this visit.    Wt Readings from Last 3 Encounters:  01/21/19 225 lb (102.1 kg)     GEN: *** Well nourished, well developed in no acute distress HEENT: Normal NECK: No JVD; No carotid bruits LYMPHATICS: No lymphadenopathy CARDIAC: ***RRR, no murmurs, rubs, gallops RESPIRATORY:  Clear to auscultation without rales, wheezing or rhonchi  ABDOMEN: Soft, non-tender, non-distended MUSCULOSKELETAL:  No edema; No deformity  SKIN: Warm and dry NEUROLOGIC:  Alert and oriented x 3 PSYCHIATRIC:  Normal affect     Signed, Norman Herrlich, MD  02/13/2021 12:28 PM    [facility]

## 2021-02-14 ENCOUNTER — Ambulatory Visit: Payer: 59 | Admitting: Cardiology

## 2021-02-14 DIAGNOSIS — E7800 Pure hypercholesterolemia, unspecified: Secondary | ICD-10-CM | POA: Insufficient documentation

## 2021-02-15 ENCOUNTER — Encounter: Payer: Self-pay | Admitting: Cardiology

## 2022-04-20 ENCOUNTER — Encounter: Payer: Self-pay | Admitting: Cardiology

## 2022-04-20 ENCOUNTER — Encounter: Payer: Self-pay | Admitting: *Deleted

## 2022-04-21 ENCOUNTER — Ambulatory Visit: Payer: 59 | Attending: Cardiology | Admitting: Cardiology

## 2022-04-21 ENCOUNTER — Encounter: Payer: Self-pay | Admitting: Cardiology

## 2022-04-21 VITALS — BP 110/80 | HR 77 | Ht 72.0 in | Wt 230.0 lb

## 2022-04-21 DIAGNOSIS — I350 Nonrheumatic aortic (valve) stenosis: Secondary | ICD-10-CM | POA: Diagnosis not present

## 2022-04-21 DIAGNOSIS — Q231 Congenital insufficiency of aortic valve: Secondary | ICD-10-CM | POA: Diagnosis not present

## 2022-04-21 DIAGNOSIS — E782 Mixed hyperlipidemia: Secondary | ICD-10-CM

## 2022-04-21 DIAGNOSIS — I10 Essential (primary) hypertension: Secondary | ICD-10-CM

## 2022-04-21 NOTE — Progress Notes (Signed)
Cardiology Office Note:    Date:  04/21/2022   ID:  Craig Alvarez, DOB 1961/06/19, MRN [id number]  PCP:  Raina Mina., MD  Cardiologist:  Shirlee More, MD   Referring MD: Raina Mina., MD  ASSESSMENT:    1. Bicuspid aortic valve   2. Nonrheumatic aortic valve stenosis   3. Essential hypertension   4. Mixed hyperlipidemia    PLAN:    In order of problems listed above:  He has a known bicuspid aortic valve I suspect he has had poor significant progression since 2020 and I am concerned that the symptoms 3 months ago were related to his aortic stenosis we will go ahead and schedule repeat echocardiogram and if severe he would require consideration of valve intervention usually surgical aortic valve replacement.  At his age he would be a candidate for bioprosthetic valve And continue to follow endocarditis prophylaxis Stable blood pressure is well controlled continue ACE inhibitor calcium channel blocker He is on a high intensity statin for lipid-lowering therapy next lipid profile we should check LP(a)  Next appointment set next weeks or sooner depending on the results of his echocardiogram   Medication Adjustments/Labs and Tests Ordered: Current medicines are reviewed at length with the patient today.  Concerns regarding medicines are outlined above.  Orders Placed This Encounter  Procedures   EKG 12-Lead   ECHOCARDIOGRAM COMPLETE   No orders of the defined types were placed in this encounter.  Chief complaint I need a follow-up for my bicuspid aortic valve few months ago is having exertional shortness of breath  History of Present Illness:    Craig Alvarez is a 61 y.o. male with a history of hypertension hyperlipidemia and a bicuspid aortic valve okay who is being seen today for the evaluation of aortic valve disease I cannot see the report of an echocardiogram but his PCP describes on 12/07/2021 that he had a bicuspid aortic valve and had been seen by me in the past.  He  is seen today at the request of Raina Mina., MD.  He saw my partner Dr. Agustin Cree 03/06/2016 his note describes a bicuspid aortic valve with mild stenosis mean gradient of 17 mmHg and he prescribed endocarditis prophylactic antibiotics. In July 2020 prior to orthopedic surgery had a repeat echocardiogram it is in a different storage system I cannot pull the report or review images and were going to work to obtain that study He follows endocarditis prophylaxis with amoxicillin 3 months ago he gained weight and he noticed he is breathless with physical activity is lost in the range of 15 pounds and has had no further symptoms He has had no edema chest pain syncope he has had no mucosal bleeding and no fever or chills His father had unspecified heart disease and died suddenly at age 25 No other family history of valvular heart disease  I was able to work with our technician and bring up his echocardiogram from 2020 out of the storage system the valve is clearly bicuspid appeared to be mildly restricted and he had mild aortic stenosis with mean gradient of 18 mmHg and no aortic regurgitation. Past Medical History:  Diagnosis Date   Acute respiratory disease due to COVID-19 virus 04/26/2020   Anxiety 01/21/2019   Bicuspid aortic valve    Chronic bilateral low back pain without sciatica 12/12/2017   Depression    Dyslipidemia 12/21/2015   ED (erectile dysfunction) 11/04/2015   Essential hypertension 01/21/2019   Hypercholesteremia  Insomnia 01/21/2019   Mixed hyperlipidemia 01/21/2019   Nonrheumatic aortic valve stenosis 12/21/2015   Prediabetes 11/04/2015   Restless legs syndrome (RLS) 01/21/2019   Tear of meniscus of knee 12/27/2018    Past Surgical History:  Procedure Laterality Date   KNEE ARTHROSCOPY WITH MENISCAL REPAIR Right 2020    Current Medications: Current Meds  Medication Sig   ALPRAZolam (XANAX) 1 MG tablet Take 1 mg by mouth daily as needed for anxiety.    amLODipine (NORVASC) 5 MG tablet Take 5 mg by mouth daily.   aspirin 81 MG chewable tablet Chew 81 mg by mouth daily.   atorvastatin (LIPITOR) 20 MG tablet Take 20 mg by mouth daily.   Calcium-Vitamin D 500-125 MG-UNIT TABS Take 1 tablet by mouth daily.   chlorthalidone (HYGROTON) 25 MG tablet Take 25 mg by mouth daily.   eszopiclone (LUNESTA) 2 MG TABS tablet Take 2 mg by mouth at bedtime.   lisinopril (ZESTRIL) 20 MG tablet Take 1 tablet by mouth daily.   Multiple Vitamin (MULTIVITAMIN) tablet Take 1 tablet by mouth daily.   Omega-3 1000 MG CAPS Take 1,000 mg by mouth daily at 6 (six) AM.   rOPINIRole (REQUIP) 0.5 MG tablet Take 0.5 mg by mouth at bedtime.   sildenafil (REVATIO) 20 MG tablet Take 20 mg by mouth daily as needed (erectile dysfunction).     Allergies:   Patient has no known allergies.   Social History   Socioeconomic History   Marital status: Single    Spouse name: Not on file   Number of children: Not on file   Years of education: Not on file   Highest education level: Not on file  Occupational History   Not on file  Tobacco Use   Smoking status: Former    Types: Cigarettes    Quit date: 1999    Years since quitting: 24.7   Smokeless tobacco: Never  Vaping Use   Vaping Use: Never used  Substance and Sexual Activity   Alcohol use: Yes    Alcohol/week: 12.0 - 14.0 standard drinks of alcohol    Types: 12 - 14 Glasses of wine per week   Drug use: Yes    Types: Marijuana   Sexual activity: Not on file  Other Topics Concern   Not on file  Social History Narrative   Not on file   Social Determinants of Health   Financial Resource Strain: Not on file  Food Insecurity: Not on file  Transportation Needs: Not on file  Physical Activity: Not on file  Stress: Not on file  Social Connections: Not on file     Family History: The patient's family history includes COPD in his mother; Cancer in his maternal grandmother; Diabetes in his brother; Heart attack in  his father.  ROS:   ROS Please see the history of present illness.     All other systems reviewed and are negative.  EKGs/Labs/Other Studies Reviewed:    The following studies were reviewed today:   EKG:  EKG is  ordered today.  The ekg ordered today is personally reviewed and demonstrates sinus rhythm voltage criteria for LVH otherwise normal EKG  Recent Labs: 12/07/2021 A1c 5.4% sodium 138 potassium 4.4 creatinine 0.97 Cholesterol 251 LDL 149 triglycerides 294 Physical Exam:    VS:  BP 110/80 (BP Location: Left Arm, Patient Position: Sitting, Cuff Size: Normal)   Pulse 77   Ht 6' (1.829 m)   Wt 230 lb (104.3 kg)   SpO2 97%  BMI 31.19 kg/m     Wt Readings from Last 3 Encounters:  04/21/22 230 lb (104.3 kg)  01/21/19 225 lb (102.1 kg)     GEN: Appears his age well nourished, well developed in no acute distress HEENT: Normal NECK: No JVD; No carotid bruits LYMPHATICS: No lymphadenopathy CARDIAC: Grade 3/6 harsh grunting murmur radiates to the carotids S2 appears single no AR he may have significant or severe aortic stenosis RRR, no murmurs, rubs, gallops RESPIRATORY:  Clear to auscultation without rales, wheezing or rhonchi  ABDOMEN: Soft, non-tender, non-distended MUSCULOSKELETAL:  No edema; No deformity  SKIN: Warm and dry NEUROLOGIC:  Alert and oriented x 3 PSYCHIATRIC:  Normal affect     Signed, Shirlee More, MD  04/21/2022 3:13 PM    [facility]

## 2022-04-21 NOTE — Patient Instructions (Addendum)
Medication Instructions:  Your physician recommends that you continue on your current medications as directed. Please refer to the Current Medication list given to you today.  *If you need a refill on your cardiac medications before your next appointment, please call your pharmacy*   Lab Work: None If you have labs (blood work) drawn today and your tests are completely normal, you will receive your results only by: [place] (if you have MyChart) OR A paper copy in the mail If you have any lab test that is abnormal or we need to change your treatment, we will call you to review the results.   Testing/Procedures: Your physician has requested that you have an echocardiogram. Echocardiography is a painless test that uses sound waves to create images of your heart. It provides your doctor with information about the size and shape of your heart and how well your heart's chambers and valves are working. This procedure takes approximately one hour. There are no restrictions for this procedure. Please do NOT wear cologne, perfume, aftershave, or lotions (deodorant is allowed). Please arrive 15 minutes prior to your appointment time.    Follow-Up: At [facility], you and your health needs are our priority.  As part of our continuing mission to provide you with exceptional heart care, we have created designated Provider Care Teams.  These Care Teams include your primary Cardiologist (physician) and Advanced Practice Providers (APPs -  Physician Assistants and Nurse Practitioners) who all work together to provide you with the care you need, when you need it.  We recommend signing up for the patient portal called "MyChart".  Sign up information is provided on this After Visit Summary.  MyChart is used to connect with patients for Virtual Visits (Telemedicine).  Patients are able to view lab/test results, encounter notes, upcoming appointments, etc.  Non-urgent messages can be sent to your  provider as well.   To learn more about what you can do with MyChart, go to [email or web address].    Your next appointment:   6 week(s)  The format for your next appointment:   In Person  Provider:   Shirlee More, MD    Other Instructions None  Important Information About Sugar

## 2022-04-28 ENCOUNTER — Encounter: Payer: Self-pay | Admitting: Cardiology

## 2022-04-28 ENCOUNTER — Ambulatory Visit: Payer: 59 | Attending: Cardiology

## 2022-04-28 DIAGNOSIS — E782 Mixed hyperlipidemia: Secondary | ICD-10-CM | POA: Diagnosis not present

## 2022-04-28 DIAGNOSIS — I10 Essential (primary) hypertension: Secondary | ICD-10-CM

## 2022-04-28 DIAGNOSIS — Q231 Congenital insufficiency of aortic valve: Secondary | ICD-10-CM

## 2022-04-28 DIAGNOSIS — I350 Nonrheumatic aortic (valve) stenosis: Secondary | ICD-10-CM | POA: Diagnosis not present

## 2022-04-28 LAB — ECHOCARDIOGRAM COMPLETE
AR max vel: 1.22 cm2
AV Area VTI: 1.28 cm2
AV Area mean vel: 1.11 cm2
AV Mean grad: 47.2 mmHg
AV Peak grad: 68.4 mmHg
Ao pk vel: 4.14 m/s
Area-P 1/2: 2.39 cm2
MV M vel: 5.56 m/s
MV Peak grad: 123.7 mmHg
P 1/2 time: 347 msec
S' Lateral: 3.5 cm

## 2022-05-01 ENCOUNTER — Telehealth: Payer: Self-pay

## 2022-05-01 NOTE — Telephone Encounter (Signed)
Patient notified of results and plan.

## 2022-05-01 NOTE — Telephone Encounter (Signed)
-----   Message from Richardo Priest, MD sent at 04/30/2022 11:18 AM EDT ----- As we suspect that the echocardiogram shows that the aortic valve is severely obstructed  He has an appointment to see me in November 17 we will discuss referral for heart catheterization at that time

## 2022-05-24 DIAGNOSIS — F32A Depression, unspecified: Secondary | ICD-10-CM | POA: Insufficient documentation

## 2022-05-26 ENCOUNTER — Ambulatory Visit: Payer: 59 | Attending: Cardiology | Admitting: Cardiology

## 2022-05-26 ENCOUNTER — Encounter: Payer: Self-pay | Admitting: Cardiology

## 2022-05-26 ENCOUNTER — Telehealth: Payer: Self-pay | Admitting: Cardiology

## 2022-05-26 VITALS — BP 144/78 | HR 76 | Ht 72.0 in | Wt 231.0 lb

## 2022-05-26 DIAGNOSIS — I10 Essential (primary) hypertension: Secondary | ICD-10-CM

## 2022-05-26 DIAGNOSIS — Z01812 Encounter for preprocedural laboratory examination: Secondary | ICD-10-CM

## 2022-05-26 DIAGNOSIS — E782 Mixed hyperlipidemia: Secondary | ICD-10-CM | POA: Diagnosis not present

## 2022-05-26 DIAGNOSIS — Q231 Congenital insufficiency of aortic valve: Secondary | ICD-10-CM | POA: Diagnosis not present

## 2022-05-26 DIAGNOSIS — I350 Nonrheumatic aortic (valve) stenosis: Secondary | ICD-10-CM

## 2022-05-26 MED ORDER — FUROSEMIDE 20 MG PO TABS: 20.0000 mg | ORAL_TABLET | Freq: Every day | ORAL | 1 refills | Status: DC

## 2022-05-26 NOTE — Progress Notes (Signed)
Cardiology Office Note:    Date:  05/26/2022   ID:  Craig Alvarez, DOB 11/30/1960, MRN [id number]  PCP:  Gordan Payment., MD  Cardiologist:  Norman Herrlich, MD    Referring MD: Gordan Payment., MD    ASSESSMENT:    1. Nonrheumatic aortic valve stenosis   2. Bicuspid aortic valve   3. Essential hypertension   4. Mixed hyperlipidemia    PLAN:    In order of problems listed above:  Craig Alvarez's aortic stenosis is clearly severe and now symptomatic and I think he has an element of heart failure.  We will go ahead and schedule him for left and right heart catheterization we will start him on a low-dose of a loop diuretic I expect a marked improvement.  I asked to go ahead and schedule a dentist visit he is overdue and gave him a note for work to see if he can get parking closer to the plants and avoid walking uphill.  Could be set up with the structural heart cardiologist. Continue current antihypertensives taking a diuretic should bring his blood pressure into the range Recheck his lipids as well as LP(a) with a bicuspid aortic valve and aortic stenosis   Next appointment: 3 months follow-up for me   Medication Adjustments/Labs and Tests Ordered: Current medicines are reviewed at length with the patient today.  Concerns regarding medicines are outlined above.  No orders of the defined types were placed in this encounter.  No orders of the defined types were placed in this encounter.   Chief Complaint  Patient presents with   Follow-up   Aortic Stenosis    History of Present Illness:    Craig Alvarez is a 61 y.o. male with a hx of known aortic valve disease with a bicuspid aortic valve hypertension hyperlipidemia last seen 04/21/2022.Marland Kitchen  He had developed new exertional shortness of breath Compliance with diet, lifestyle and medications: Yes  Last time I saw him he is subtly short of breath that is no longer subtle.  He is having trouble walking from the parking lot into work because  of being breathless and when he uses his upper extremities has to stop and rest.  He has not had chest pain or syncope.  He does not have orthopnea but on exam he has significant peripheral edema more than I expect to see with amlodipine.  With his shortness of breath and underlying valvular heart disease he will start furosemide 20 mg daily we will check an N-terminal proBNP with his precath labs and directly refer to the left and right heart catheterization in preparation for valvular intervention.  He understands he may require surgical aortic valve replacement with a bicuspid valve.  I set him up with the structural cardiologist to do his left and right heart catheterization.  He is overdue for dental and I asked him to make an appointment including Panorex x-rays.  An echocardiogram performed 04/28/2022.  The aortic valve was bicuspid with calcification and thickening mild aortic regurgitation and severe aortic stenosis and mean gradient 47 mmHg VTI ratio 0 point 6 aortic valve area 1.1 with a large LV outflow tract diameter of 25 mm.  Left ventricular systolic function was low normal 50 to 55% with moderate LVH and moderate left atrial enlargement.  Ascending aorta mildly enlarged 39 mm.  He was previously seen and I was able to work with our technician and bring up his echocardiogram from 2020 out of the storage system the valve is clearly  bicuspid appeared to be mildly restricted and he had mild aortic stenosis with mean gradient of 18 mmHg and no aortic regurgitation.   Past Surgical History:  Procedure Laterality Date   KNEE ARTHROSCOPY WITH MENISCAL REPAIR Right 2020    Current Medications: Current Meds  Medication Sig   ALPRAZolam (XANAX) 1 MG tablet Take 1 mg by mouth daily as needed for anxiety.   amLODipine (NORVASC) 5 MG tablet Take 5 mg by mouth daily.   aspirin 81 MG chewable tablet Chew 81 mg by mouth daily.   atorvastatin (LIPITOR) 20 MG tablet Take 20 mg by mouth daily.    Calcium-Vitamin D 500-125 MG-UNIT TABS Take 1 tablet by mouth daily.   chlorthalidone (HYGROTON) 25 MG tablet Take 25 mg by mouth daily.   eszopiclone (LUNESTA) 2 MG TABS tablet Take 2 mg by mouth at bedtime.   lisinopril (ZESTRIL) 20 MG tablet Take 1 tablet by mouth daily.   Multiple Vitamin (MULTIVITAMIN) tablet Take 1 tablet by mouth daily.   Omega-3 1000 MG CAPS Take 1,000 mg by mouth daily at 6 (six) AM.   rOPINIRole (REQUIP) 0.5 MG tablet Take 0.5 mg by mouth at bedtime.   sildenafil (REVATIO) 20 MG tablet Take 20 mg by mouth daily as needed (erectile dysfunction).     Allergies:   Patient has no known allergies.   Social History   Socioeconomic History   Marital status: Single    Spouse name: Not on file   Number of children: Not on file   Years of education: Not on file   Highest education level: Not on file  Occupational History   Not on file  Tobacco Use   Smoking status: Former    Types: Cigarettes    Quit date: 1999    Years since quitting: 24.8   Smokeless tobacco: Never  Vaping Use   Vaping Use: Never used  Substance and Sexual Activity   Alcohol use: Yes    Alcohol/week: 12.0 - 14.0 standard drinks of alcohol    Types: 12 - 14 Glasses of wine per week   Drug use: Yes    Types: Marijuana   Sexual activity: Not on file  Other Topics Concern   Not on file  Social History Narrative   Not on file   Social Determinants of Health   Financial Resource Strain: Not on file  Food Insecurity: Not on file  Transportation Needs: Not on file  Physical Activity: Not on file  Stress: Not on file  Social Connections: Not on file     Family History: The patient's family history includes COPD in his mother; Cancer in his maternal grandmother; Diabetes in his brother; Heart attack in his father. ROS:   Please see the history of present illness.    All other systems reviewed and are negative.  EKGs/Labs/Other Studies Reviewed:    The following studies were  reviewed today:  total cholesterol 253, HDL 46, LDL 130, Triglycerides 382.     Physical Exam:    VS:  BP (!) 144/78 (BP Location: Right Arm, Patient Position: Sitting)   Pulse 76   Ht 6' (1.829 m)   Wt 231 lb (104.8 kg)   SpO2 96%   BMI 31.33 kg/m     Wt Readings from Last 3 Encounters:  05/26/22 231 lb (104.8 kg)  04/21/22 230 lb (104.3 kg)  01/21/19 225 lb (102.1 kg)     GEN:  Well nourished, well developed in no acute distress he is  mildly breathless in the office HEENT: Normal NECK: No JVD; No carotid bruits LYMPHATICS: No lymphadenopathy CARDIAC: Grade 3/6 holosystolic murmur aortic stenosis encompasses S2 and radiates to the carotids RRR, no murmurs, rubs, gallops RESPIRATORY:  Clear to auscultation without rales, wheezing or rhonchi  ABDOMEN: Soft, non-tender, non-distended MUSCULOSKELETAL: He has 3-4+ lower extremity edema ankle to knee edema; No deformity  SKIN: Warm and dry NEUROLOGIC:  Alert and oriented x 3 PSYCHIATRIC:  Normal affect    Signed, Norman Herrlich, MD  05/26/2022 2:10 PM    [facility]

## 2022-05-26 NOTE — Patient Instructions (Signed)
Medication Instructions:  Your physician has recommended you make the following change in your medication:  Start Furosemide 20 mg once daily Get Dental Visit ASAP with X-rays prior to Valve Correction  *If you need a refill on your cardiac medications before your next appointment, please call your pharmacy*   Lab Work: Your physician recommends that you return for lab work in: Today for a CBC, BMP, ProBNP, Lipid Panel and LPa  If you have labs (blood work) drawn today and your tests are completely normal, you will receive your results only by: MyChart Message (if you have MyChart) OR A paper copy in the mail If you have any lab test that is abnormal or we need to change your treatment, we will call you to review the results.   Testing/Procedures:  [place] A DEPT OF [facility] [place] A DEPT OF [facility] [address] [place] [address] Dept: [phone] Loc: [phone]  Craig Alvarez  05/26/2022  You are scheduled for a Cardiac Catheterization on Tuesday, November 21 with Dr. Bryan Lemma.  1. Please arrive at the [facility] (Main Entrance A) at [facility]: [address] [place], [place] [address] at 8:30 AM (This time is two hours before your procedure to ensure your preparation). Free valet parking service is available.   Special note: Every effort is made to have your procedure done on time. Please understand that emergencies sometimes delay scheduled procedures.  2. Diet: Do not eat solid foods after midnight.  The patient may have clear liquids until 5am upon the day of the procedure.  3. Labs: You will need to have blood drawn on Today  4. Medication instructions in preparation for your procedure:   Contrast Allergy: No    Current Outpatient Medications (Cardiovascular):    amLODipine (NORVASC) 5 MG tablet, Take 5 mg by mouth daily.   atorvastatin (LIPITOR) 20 MG tablet,  Take 20 mg by mouth daily.   chlorthalidone (HYGROTON) 25 MG tablet, Take 25 mg by mouth daily.   furosemide (LASIX) 20 MG tablet, Take 1 tablet (20 mg total) by mouth daily.   lisinopril (ZESTRIL) 20 MG tablet, Take 1 tablet by mouth daily.   sildenafil (REVATIO) 20 MG tablet, Take 20 mg by mouth daily as needed (erectile dysfunction).   Current Outpatient Medications (Analgesics):    aspirin 81 MG chewable tablet, Chew 81 mg by mouth daily.   Current Outpatient Medications (Other):    ALPRAZolam (XANAX) 1 MG tablet, Take 1 mg by mouth daily as needed for anxiety.   Calcium-Vitamin D 500-125 MG-UNIT TABS, Take 1 tablet by mouth daily.   eszopiclone (LUNESTA) 2 MG TABS tablet, Take 2 mg by mouth at bedtime.   Multiple Vitamin (MULTIVITAMIN) tablet, Take 1 tablet by mouth daily.   Omega-3 1000 MG CAPS, Take 1,000 mg by mouth daily at 6 (six) AM.   rOPINIRole (REQUIP) 0.5 MG tablet, Take 0.5 mg by mouth at bedtime. *For reference purposes while preparing patient instructions.   Delete this med list prior to printing instructions for patient.*    Don't take Lisinopril day of procedure      On the morning of your procedure, take your Aspirin 81 mg and any morning medicines NOT listed above.  You may use sips of water.  5. Plan for one night stay--bring personal belongings. 6. Bring a current list of your medications and current insurance cards. 7. You MUST have  a responsible person to drive you home. 8. Someone MUST be with you the first 24 hours after you arrive home or your discharge will be delayed. 9. Please wear clothes that are easy to get on and off and wear slip-on shoes.  Thank you for allowing [place] to care for you!   -- [facility]    Follow-Up: At [facility], you and your health needs are our priority.  As part of our continuing mission to provide you with exceptional heart care, we have created designated Provider Care  Teams.  These Care Teams include your primary Cardiologist (physician) and Advanced Practice Providers (APPs -  Physician Assistants and Nurse Practitioners) who all work together to provide you with the care you need, when you need it.  We recommend signing up for the patient portal called "MyChart".  Sign up information is provided on this After Visit Summary.  MyChart is used to connect with patients for Virtual Visits (Telemedicine).  Patients are able to view lab/test results, encounter notes, upcoming appointments, etc.  Non-urgent messages can be sent to your provider as well.   To learn more about what you can do with MyChart, go to [email or web address].    Your next appointment:   4 week(s)  The format for your next appointment:   In Person  Provider:   Norman Herrlich, MD    Other Instructions   Important Information About Sugar

## 2022-05-26 NOTE — Telephone Encounter (Signed)
Patient states he called [facility] and cancelled 11/21 heart cath because he was notified that he will have to pay upfront and he is waiting for funds to hit his account--FYI.

## 2022-05-29 ENCOUNTER — Telehealth: Payer: Self-pay | Admitting: Cardiology

## 2022-05-29 LAB — CBC WITH DIFFERENTIAL/PLATELET
Basophils Absolute: 0 10*3/uL (ref 0.0–0.2)
Basos: 1 %
EOS (ABSOLUTE): 0.1 10*3/uL (ref 0.0–0.4)
Eos: 3 %
Hematocrit: 40.4 % (ref 37.5–51.0)
Hemoglobin: 13.7 g/dL (ref 13.0–17.7)
Immature Grans (Abs): 0 10*3/uL (ref 0.0–0.1)
Immature Granulocytes: 0 %
Lymphocytes Absolute: 1.4 10*3/uL (ref 0.7–3.1)
Lymphs: 35 %
MCH: 32.4 pg (ref 26.6–33.0)
MCHC: 33.9 g/dL (ref 31.5–35.7)
MCV: 96 fL (ref 79–97)
Monocytes Absolute: 0.4 10*3/uL (ref 0.1–0.9)
Monocytes: 9 %
Neutrophils Absolute: 2.1 10*3/uL (ref 1.4–7.0)
Neutrophils: 52 %
Platelets: 201 10*3/uL (ref 150–450)
RBC: 4.23 x10E6/uL (ref 4.14–5.80)
RDW: 13.3 % (ref 11.6–15.4)
WBC: 4.1 10*3/uL (ref 3.4–10.8)

## 2022-05-29 LAB — LIPID PANEL
Chol/HDL Ratio: 4.6 ratio (ref 0.0–5.0)
Cholesterol, Total: 181 mg/dL (ref 100–199)
HDL: 39 mg/dL — ABNORMAL LOW (ref >39)
LDL Chol Calc (NIH): 99 mg/dL (ref 0–99)
Triglycerides: 252 mg/dL — ABNORMAL HIGH (ref 0–149)
VLDL Cholesterol Cal: 43 mg/dL — ABNORMAL HIGH (ref 5–40)

## 2022-05-29 LAB — BASIC METABOLIC PANEL
BUN/Creatinine Ratio: 32 — ABNORMAL HIGH (ref 10–24)
BUN: 29 mg/dL — ABNORMAL HIGH (ref 8–27)
CO2: 23 mmol/L (ref 20–29)
Calcium: 9.7 mg/dL (ref 8.6–10.2)
Chloride: 103 mmol/L (ref 96–106)
Creatinine, Ser: 0.91 mg/dL (ref 0.76–1.27)
Glucose: 110 mg/dL — ABNORMAL HIGH (ref 70–99)
Potassium: 4.2 mmol/L (ref 3.5–5.2)
Sodium: 139 mmol/L (ref 134–144)
eGFR: 96 mL/min/{1.73_m2} (ref >59)

## 2022-05-29 LAB — PRO B NATRIURETIC PEPTIDE: NT-Pro BNP: 469 pg/mL — ABNORMAL HIGH (ref 0–210)

## 2022-05-29 LAB — LIPOPROTEIN A (LPA): Lipoprotein (a): 155.9 nmol/L — ABNORMAL HIGH (ref <75.0)

## 2022-05-29 NOTE — Telephone Encounter (Signed)
Attempted to call patient. Patient's voice mailbox is not set-up so unable to leave a message.

## 2022-05-29 NOTE — Telephone Encounter (Signed)
Patient wants a call back to clarify if a  test will be included with the cardiac cath procedure.

## 2022-05-29 NOTE — Telephone Encounter (Signed)
Number for billing given to pt. Pt verbalized understanding and had no additional questions.

## 2022-05-29 NOTE — Telephone Encounter (Signed)
Attempted to call patient. Patient's voice mailbox is not set-up so unable to leave a message. 

## 2022-05-29 NOTE — Telephone Encounter (Signed)
Patient returned RN's call. 

## 2022-05-29 NOTE — Addendum Note (Signed)
Addended by: Smiley Houseman B on: 05/29/2022 07:08 AM   Modules accepted: Orders

## 2022-05-30 ENCOUNTER — Ambulatory Visit (HOSPITAL_COMMUNITY): Admission: RE | Admit: 2022-05-30 | Payer: 59 | Source: Home / Self Care | Admitting: Cardiology

## 2022-05-30 ENCOUNTER — Encounter (HOSPITAL_COMMUNITY): Admission: RE | Payer: Self-pay | Source: Home / Self Care

## 2022-05-30 SURGERY — RIGHT/LEFT HEART CATH AND CORONARY ANGIOGRAPHY
Anesthesia: LOCAL

## 2022-05-31 NOTE — Telephone Encounter (Signed)
Called patient to verify that he did not want [place] to reschedule his cardiac cath. Patient stated that he wanted to wait until after the first of the year to reschedule his cardiac cath because then he would have the funds to pay for it. I advised him that if he had any concerns between now and then that he could always call us back. Patient was appreciative for the call and had no further questions at this time.

## 2022-05-31 NOTE — Telephone Encounter (Signed)
Patient returning call.

## 2022-05-31 NOTE — Telephone Encounter (Signed)
Left message for the patient to call back.

## 2022-06-06 ENCOUNTER — Other Ambulatory Visit: Payer: Self-pay

## 2022-06-06 ENCOUNTER — Telehealth: Payer: Self-pay | Admitting: Cardiology

## 2022-06-06 MED ORDER — EZETIMIBE 10 MG PO TABS: 10.0000 mg | ORAL_TABLET | Freq: Every day | ORAL | 3 refills | Status: AC

## 2022-06-06 NOTE — Telephone Encounter (Signed)
Patient is returning call to discuss lab results. 

## 2022-06-06 NOTE — Telephone Encounter (Signed)
Patient informed of results.  

## 2022-06-23 DIAGNOSIS — D649 Anemia, unspecified: Secondary | ICD-10-CM | POA: Insufficient documentation

## 2022-06-23 DIAGNOSIS — D500 Iron deficiency anemia secondary to blood loss (chronic): Secondary | ICD-10-CM

## 2022-06-23 HISTORY — DX: Iron deficiency anemia secondary to blood loss (chronic): D50.0

## 2022-06-23 HISTORY — DX: Anemia, unspecified: D64.9

## 2022-06-26 NOTE — Progress Notes (Unsigned)
Cardiology Office Note:    Date:  06/27/2022   ID:  [name], DOB 24-Jun-1961, MRN [id number]  PCP:  Gordan Payment., MD  Cardiologist:  Norman Herrlich, MD    Referring MD: Gordan Payment., MD    ASSESSMENT:    1. Nonrheumatic aortic valve stenosis   2. Bicuspid aortic valve   3. Hypertensive heart disease with chronic diastolic congestive heart failure (HCC)   4. Mixed hyperlipidemia    PLAN:    In order of problems listed above:  His heart failure is improved proceed left and right heart catheterization in preparation for valvular intervention likely surgical aortic valve replacement. Continue his diuretic along with antihypertensives and statin.   Next appointment: 6 to 8 weeks   Medication Adjustments/Labs and Tests Ordered: Current medicines are reviewed at length with the patient today.  Concerns regarding medicines are outlined above.  No orders of the defined types were placed in this encounter.  No orders of the defined types were placed in this encounter.   Follow-up for aortic stenosis   History of Present Illness:    Craig Alvarez is a 61 y.o. male with a hx of severe and symptomatic aortic stenosis hypertension hyperlipidemia bicuspid aortic valve who deferred undergoing evaluation for intervention with occupation and financial demands when he was last seen 05/26/2022 with heart failure.  He had an echocardiogram performed 04/28/2022. The aortic valve was bicuspid with calcification and thickening mild aortic regurgitation and severe aortic stenosis and mean gradient 47 mmHg VTI aortic valve area 1.1 with a large LV outflow tract diameter of 25 mm. Left ventricular systolic function was low normal 50 to 55% with moderate LVH and moderate left atrial enlargement. Ascending aorta mildly enlarged 39 mm.  Compliance with diet, lifestyle and medications: Yes  He is improved with the low-dose diuretics tolerating edema still struggles at work but is not limited  by shortness of breath no orthopnea chest pain palpitation or syncope Seen by his dentist he had Panorex x-rays and tells me he has no contraindication to surgery no abscess. He has had no fever or chills He wants to proceed with left and right heart catheterization in preparation for aortic valve intervention I told him in his age group and bicuspid aortic valve he be looking at surgical AVR Past Medical History:  Diagnosis Date   Acute respiratory disease due to COVID-19 virus 04/26/2020   Anxiety 01/21/2019   Bicuspid aortic valve    Chronic bilateral low back pain without sciatica 12/12/2017   Depression    Dyslipidemia 12/21/2015   ED (erectile dysfunction) 11/04/2015   Essential hypertension 01/21/2019   Hypercholesteremia    Insomnia 01/21/2019   Mixed hyperlipidemia 01/21/2019   Nonrheumatic aortic valve stenosis 12/21/2015   Prediabetes 11/04/2015   Restless legs syndrome (RLS) 01/21/2019   Tear of meniscus of knee 12/27/2018    Past Surgical History:  Procedure Laterality Date   KNEE ARTHROSCOPY WITH MENISCAL REPAIR Right 2020    Current Medications: No outpatient medications have been marked as taking for the 06/27/22 encounter (Office Visit) with Baldo Daub, MD.     Allergies:   Patient has no known allergies.   Social History   Socioeconomic History   Marital status: Single    Spouse name: Not on file   Number of children: Not on file   Years of education: Not on file   Highest education level: Not on file  Occupational History   Not on file  Tobacco  Use   Smoking status: Former    Types: Cigarettes    Quit date: 1999    Years since quitting: 24.9   Smokeless tobacco: Never  Vaping Use   Vaping Use: Never used  Substance and Sexual Activity   Alcohol use: Yes    Alcohol/week: 12.0 - 14.0 standard drinks of alcohol    Types: 12 - 14 Glasses of wine per week   Drug use: Yes    Types: Marijuana   Sexual activity: Not on file  Other Topics  Concern   Not on file  Social History Narrative   Not on file   Social Determinants of Health   Financial Resource Strain: Not on file  Food Insecurity: Not on file  Transportation Needs: Not on file  Physical Activity: Not on file  Stress: Not on file  Social Connections: Not on file     Family History: The patient's family history includes COPD in his mother; Cancer in his maternal grandmother; Diabetes in his brother; Heart attack in his father. ROS:   Please see the history of present illness.    All other systems reviewed and are negative.  EKGs/Labs/Other Studies Reviewed:    The following studies were reviewed today:   Recent Labs: 05/26/2022: BUN 29; Creatinine, Ser 0.91; Hemoglobin 13.7; NT-Pro BNP 469; Platelets 201; Potassium 4.2; Sodium 139  Recent Lipid Panel    Component Value Date/Time   CHOL 181 05/26/2022 1444   TRIG 252 (H) 05/26/2022 1444   HDL 39 (L) 05/26/2022 1444   CHOLHDL 4.6 05/26/2022 1444   LDLCALC 99 05/26/2022 1444    Physical Exam:    VS:  BP (!) 140/82 (BP Location: Right Arm, Patient Position: Sitting)   Pulse 76   Ht 6' (1.829 m)   Wt 236 lb (107 kg)   SpO2 97%   BMI 32.01 kg/m     Wt Readings from Last 3 Encounters:  06/27/22 236 lb (107 kg)  05/26/22 231 lb (104.8 kg)  04/21/22 230 lb (104.3 kg)     GEN:  Well nourished, well developed in no acute distress HEENT: Normal NECK: No JVD; No carotid bruits LYMPHATICS: No lymphadenopathy CARDIAC: 3/6 holosystolic murmur radiates through the carotids S2 is single no aortic regurgitation RRR, no murmurs, rubs, gallops RESPIRATORY:  Clear to auscultation without rales, wheezing or rhonchi  ABDOMEN: Soft, non-tender, non-distended MUSCULOSKELETAL:  No edema; No deformity  SKIN: Warm and dry NEUROLOGIC:  Alert and oriented x 3 PSYCHIATRIC:  Normal affect    Signed, Norman Herrlich, MD  06/27/2022 12:58 PM    [facility]

## 2022-06-26 NOTE — H&P (View-Only) (Signed)
Cardiology Office Note:    Date:  06/27/2022   ID:  [name], DOB [birth year], MRN [id number]  PCP:  Gordan Payment., MD  Cardiologist:  Norman Herrlich, MD    Referring MD: Gordan Payment., MD    ASSESSMENT:    1. Nonrheumatic aortic valve stenosis   2. Bicuspid aortic valve   3. Hypertensive heart disease with chronic diastolic congestive heart failure (HCC)   4. Mixed hyperlipidemia    PLAN:    In order of problems listed above:  His heart failure is improved proceed left and right heart catheterization in preparation for valvular intervention likely surgical aortic valve replacement. Continue his diuretic along with antihypertensives and statin.   Next appointment: 6 to 8 weeks   Medication Adjustments/Labs and Tests Ordered: Current medicines are reviewed at length with the patient today.  Concerns regarding medicines are outlined above.  No orders of the defined types were placed in this encounter.  No orders of the defined types were placed in this encounter.   Follow-up for aortic stenosis   History of Present Illness:    Craig Alvarez is a 61 y.o. male with a hx of severe and symptomatic aortic stenosis hypertension hyperlipidemia bicuspid aortic valve who deferred undergoing evaluation for intervention with occupation and financial demands when he was last seen 05/26/2022 with heart failure.  He had an echocardiogram performed 04/28/2022. The aortic valve was bicuspid with calcification and thickening mild aortic regurgitation and severe aortic stenosis and mean gradient 47 mmHg VTI aortic valve area 1.1 with a large LV outflow tract diameter of 25 mm. Left ventricular systolic function was low normal 50 to 55% with moderate LVH and moderate left atrial enlargement. Ascending aorta mildly enlarged 39 mm.  Compliance with diet, lifestyle and medications: Yes  He is improved with the low-dose diuretics tolerating edema still struggles at work but is not limited  by shortness of breath no orthopnea chest pain palpitation or syncope Seen by his dentist he had Panore[facility] x-rays and tells me he has no contraindication to surgery no abscess. He has had no fever or chills He wants to proceed with left and right heart catheterization in preparation for aortic valve intervention I told him in his age group and bicuspid aortic valve he be looking at surgical AVR Past Medical History:  Diagnosis Date   Acute respiratory disease due to COVID-19 virus 04/26/2020   Anxiety 01/21/2019   Bicuspid aortic valve    Chronic bilateral low back pain without sciatica 12/12/2017   Depression    Dyslipidemia 12/21/2015   ED (erectile dysfunction) 11/04/2015   Essential hypertension 01/21/2019   Hypercholesteremia    Insomnia 01/21/2019   Mixed hyperlipidemia 01/21/2019   Nonrheumatic aortic valve stenosis 12/21/2015   Prediabetes 11/04/2015   Restless legs syndrome (RLS) 01/21/2019   Tear of meniscus of knee 12/27/2018    Past Surgical History:  Procedure Laterality Date   KNEE ARTHROSCOPY WITH MENISCAL REPAIR Right 2020    Current Medications: No outpatient medications have been marked as taking for the 06/27/22 encounter (Office Visit) with Baldo Daub, MD.     Allergies:   Patient has no known allergies.   Social History   Socioeconomic History   Marital status: Single    Spouse name: Not on file   Number of children: Not on file   Years of education: Not on file   Highest education level: Not on file  Occupational History   Not on file  Tobacco  Use   Smoking status: Former    Types: Cigarettes    Quit date: 1999    Years since quitting: 24.9   Smokeless tobacco: Never  Vaping Use   Vaping Use: Never used  Substance and Sexual Activity   Alcohol use: Yes    Alcohol/week: 12.0 - 14.0 standard drinks of alcohol    Types: 12 - 14 Glasses of wine per week   Drug use: Yes    Types: Marijuana   Sexual activity: Not on file  Other Topics  Concern   Not on file  Social History Narrative   Not on file   Social Determinants of Health   Financial Resource Strain: Not on file  Food Insecurity: Not on file  Transportation Needs: Not on file  Physical Activity: Not on file  Stress: Not on file  Social Connections: Not on file     Family History: The patient's family history includes COPD in his mother; Cancer in his maternal grandmother; Diabetes in his brother; Heart attack in his father. ROS:   Please see the history of present illness.    All other systems reviewed and are negative.  EKGs/Labs/Other Studies Reviewed:    The following studies were reviewed today:   Recent Labs: 05/26/2022: BUN 29; Creatinine, Ser 0.91; Hemoglobin 13.7; NT-Pro BNP 469; Platelets 201; Potassium 4.2; Sodium 139  Recent Lipid Panel    Component Value Date/Time   CHOL 181 05/26/2022 1444   TRIG 252 (H) 05/26/2022 1444   HDL 39 (L) 05/26/2022 1444   CHOLHDL 4.6 05/26/2022 1444   LDLCALC 99 05/26/2022 1444    Physical Exam:    VS:  BP (!) 140/82 (BP Location: Right Arm, Patient Position: Sitting)   Pulse 76   Ht 6' (1.829 m)   Wt 236 lb (107 kg)   SpO2 97%   BMI 32.01 kg/m     Wt Readings from Last 3 Encounters:  06/27/22 236 lb (107 kg)  05/26/22 231 lb (104.8 kg)  04/21/22 230 lb (104.3 kg)     GEN:  Well nourished, well developed in no acute distress HEENT: Normal NECK: No JVD; No carotid bruits LYMPHATICS: No lymphadenopathy CARDIAC: 3/6 holosystolic murmur radiates through the carotids S2 is single no aortic regurgitation RRR, no murmurs, rubs, gallops RESPIRATORY:  Clear to auscultation without rales, wheezing or rhonchi  ABDOMEN: Soft, non-tender, non-distended MUSCULOSKELETAL:  No edema; No deformity  SKIN: Warm and dry NEUROLOGIC:  Alert and oriented x 3 PSYCHIATRIC:  Normal affect    Signed, Norman Herrlich, MD  06/27/2022 12:58 PM    [facility]

## 2022-06-27 ENCOUNTER — Encounter: Payer: Self-pay | Admitting: Cardiology

## 2022-06-27 ENCOUNTER — Ambulatory Visit: Payer: 59 | Attending: Cardiology | Admitting: Cardiology

## 2022-06-27 VITALS — BP 140/82 | HR 76 | Ht 72.0 in | Wt 236.0 lb

## 2022-06-27 DIAGNOSIS — I350 Nonrheumatic aortic (valve) stenosis: Secondary | ICD-10-CM

## 2022-06-27 DIAGNOSIS — E782 Mixed hyperlipidemia: Secondary | ICD-10-CM

## 2022-06-27 DIAGNOSIS — I110 Hypertensive heart disease with heart failure: Secondary | ICD-10-CM

## 2022-06-27 DIAGNOSIS — Z01812 Encounter for preprocedural laboratory examination: Secondary | ICD-10-CM

## 2022-06-27 DIAGNOSIS — Q231 Congenital insufficiency of aortic valve: Secondary | ICD-10-CM

## 2022-06-27 DIAGNOSIS — I5032 Chronic diastolic (congestive) heart failure: Secondary | ICD-10-CM

## 2022-06-27 NOTE — Patient Instructions (Signed)
Medication Instructions:  Your physician recommends that you continue on your current medications as directed. Please refer to the Current Medication list given to you today.  *If you need a refill on your cardiac medications before your next appointment, please call your pharmacy*   Lab Work: Your physician recommends that you return for lab work in: Today for a CBC & BMP  If you have labs (blood work) drawn today and your tests are completely normal, you will receive your results only by: MyChart Message (if you have MyChart) OR A paper copy in the mail If you have any lab test that is abnormal or we need to change your treatment, we will call you to review the results.   Testing/Procedures:  [place] A DEPT OF [facility] [place] A DEPT OF [facility] [address] [place] [address] Dept: [phone] Loc: [phone]  Craig Alvarez  06/27/2022  You are scheduled for a Cardiac Catheterization on Thursday, January 4 with Dr. Alverda Skeans.  1. Please arrive at the [facility] (Main Entrance A) at [facility]: [address] [place], [place] [address] at 10:00 AM (This time is two hours before your procedure to ensure your preparation). Free valet parking service is available.   Special note: Every effort is made to have your procedure done on time. Please understand that emergencies sometimes delay scheduled procedures.  2. Diet: Do not eat solid foods after midnight.  The patient may have clear liquids until 5am upon the day of the procedure.  3. Labs: You will need to have blood drawn on Today  4. Medication instructions in preparation for your procedure:   Contrast Allergy: No    Current Outpatient Medications (Cardiovascular):    ezetimibe (ZETIA) 10 MG tablet, Take 1 tablet (10 mg total) by mouth daily.   amLODipine (NORVASC) 5 MG tablet, Take 5 mg by mouth in the morning.    atorvastatin (LIPITOR) 20 MG tablet, Take 20 mg by mouth every evening.   chlorthalidone (HYGROTON) 25 MG tablet, Take 25 mg by mouth in the morning.   furosemide (LASIX) 20 MG tablet, Take 1 tablet (20 mg total) by mouth daily.   lisinopril (ZESTRIL) 20 MG tablet, Take 20 mg by mouth in the morning.   sildenafil (REVATIO) 20 MG tablet, Take 60-80 mg by mouth daily as needed (erectile dysfunction).   Current Outpatient Medications (Analgesics):    aspirin 81 MG chewable tablet, Chew 81 mg by mouth in the morning.   ibuprofen (ADVIL) 200 MG tablet, Take 600 mg by mouth every 8 (eight) hours as needed (pain.).   Current Outpatient Medications (Other):    ALPRAZolam (XANAX) 1 MG tablet, Take 1 mg by mouth at bedtime.   CALCIUM-VITAMIN D PO, Take 1 tablet by mouth in the morning.   eszopiclone (LUNESTA) 2 MG TABS tablet, Take 2 mg by mouth at bedtime as needed for sleep.   Multiple Vitamin (MULTIVITAMIN) tablet, Take 1 tablet by mouth in the morning.   Omega-3 1000 MG CAPS, Take 1,000 mg by mouth in the morning.   rOPINIRole (REQUIP) 0.5 MG tablet, Take 0.5 mg by mouth at bedtime. *For reference purposes while preparing patient instructions.   Delete this med list prior to printing instructions for patient.*    Stop taking, Lisinopril (Zestril or Prinivil) Thursday, January 4, Hold Furosemide day of testing. No Sildenafil 48 hours prior to test  On the morning of your procedure, take your Aspirin 81 mg and any morning medicines NOT listed above.  You may use sips of water.  5. Plan for one night stay--bring personal belongings. 6. Bring a current list of your medications and current insurance cards. 7. You MUST have a responsible person to drive you home. 8. Someone MUST be with you the first 24 hours after you arrive home or your discharge will be delayed. 9. Please wear clothes that are easy to get on and off and wear slip-on shoes.  Thank you for allowing [place] to care for  you!   -- [facility]    Follow-Up: At [facility], you and your health needs are our priority.  As part of our continuing mission to provide you with exceptional heart care, we have created designated Provider Care Teams.  These Care Teams include your primary Cardiologist (physician) and Advanced Practice Providers (APPs -  Physician Assistants and Nurse Practitioners) who all work together to provide you with the care you need, when you need it.  We recommend signing up for the patient portal called "MyChart".  Sign up information is provided on this After Visit Summary.  MyChart is used to connect with patients for Virtual Visits (Telemedicine).  Patients are able to view lab/test results, encounter notes, upcoming appointments, etc.  Non-urgent messages can be sent to your provider as well.   To learn more about what you can do with MyChart, go to [email or web address].    Your next appointment:   6 week(s)  The format for your next appointment:   In Person  Provider:   Norman Herrlich, MD    Other Instructions   Important Information About Sugar

## 2022-06-28 LAB — CBC WITH DIFFERENTIAL/PLATELET
Basophils Absolute: 0 10*3/uL (ref 0.0–0.2)
Basos: 1 %
EOS (ABSOLUTE): 0.1 10*3/uL (ref 0.0–0.4)
Eos: 1 %
Hematocrit: 39.1 % (ref 37.5–51.0)
Hemoglobin: 13.7 g/dL (ref 13.0–17.7)
Immature Grans (Abs): 0 10*3/uL (ref 0.0–0.1)
Immature Granulocytes: 0 %
Lymphocytes Absolute: 1.4 10*3/uL (ref 0.7–3.1)
Lymphs: 29 %
MCH: 32.6 pg (ref 26.6–33.0)
MCHC: 35 g/dL (ref 31.5–35.7)
MCV: 93 fL (ref 79–97)
Monocytes Absolute: 0.4 10*3/uL (ref 0.1–0.9)
Monocytes: 8 %
Neutrophils Absolute: 2.9 10*3/uL (ref 1.4–7.0)
Neutrophils: 61 %
Platelets: 219 10*3/uL (ref 150–450)
RBC: 4.2 x10E6/uL (ref 4.14–5.80)
RDW: 13.1 % (ref 11.6–15.4)
WBC: 4.8 10*3/uL (ref 3.4–10.8)

## 2022-06-28 LAB — BASIC METABOLIC PANEL
BUN/Creatinine Ratio: 28 — ABNORMAL HIGH (ref 10–24)
BUN: 27 mg/dL (ref 8–27)
CO2: 22 mmol/L (ref 20–29)
Calcium: 9.7 mg/dL (ref 8.6–10.2)
Chloride: 101 mmol/L (ref 96–106)
Creatinine, Ser: 0.96 mg/dL (ref 0.76–1.27)
Glucose: 98 mg/dL (ref 70–99)
Potassium: 4.6 mmol/L (ref 3.5–5.2)
Sodium: 138 mmol/L (ref 134–144)
eGFR: 90 mL/min/{1.73_m2} (ref >59)

## 2022-07-10 DIAGNOSIS — I2510 Atherosclerotic heart disease of native coronary artery without angina pectoris: Secondary | ICD-10-CM

## 2022-07-10 DIAGNOSIS — I6529 Occlusion and stenosis of unspecified carotid artery: Secondary | ICD-10-CM

## 2022-07-10 HISTORY — DX: Atherosclerotic heart disease of native coronary artery without angina pectoris: I25.10

## 2022-07-10 HISTORY — DX: Occlusion and stenosis of unspecified carotid artery: I65.29

## 2022-07-13 ENCOUNTER — Other Ambulatory Visit: Payer: Self-pay

## 2022-07-13 ENCOUNTER — Observation Stay (HOSPITAL_COMMUNITY)
Admission: RE | Admit: 2022-07-13 | Discharge: 2022-07-14 | Disposition: A | Discharge status: Home / Self Care | Payer: 59 | Attending: Internal Medicine | Admitting: Internal Medicine

## 2022-07-13 ENCOUNTER — Encounter (HOSPITAL_COMMUNITY)
Admission: RE | Disposition: A | Discharge status: Home / Self Care | Payer: Self-pay | Source: Home / Self Care | Attending: Internal Medicine

## 2022-07-13 DIAGNOSIS — Z8616 Personal history of COVID-19: Secondary | ICD-10-CM | POA: Diagnosis not present

## 2022-07-13 DIAGNOSIS — I110 Hypertensive heart disease with heart failure: Secondary | ICD-10-CM | POA: Insufficient documentation

## 2022-07-13 DIAGNOSIS — I5032 Chronic diastolic (congestive) heart failure: Secondary | ICD-10-CM | POA: Insufficient documentation

## 2022-07-13 DIAGNOSIS — Z87891 Personal history of nicotine dependence: Secondary | ICD-10-CM | POA: Diagnosis not present

## 2022-07-13 DIAGNOSIS — I2510 Atherosclerotic heart disease of native coronary artery without angina pectoris: Secondary | ICD-10-CM

## 2022-07-13 DIAGNOSIS — Q231 Congenital insufficiency of aortic valve: Secondary | ICD-10-CM | POA: Diagnosis not present

## 2022-07-13 DIAGNOSIS — I350 Nonrheumatic aortic (valve) stenosis: Secondary | ICD-10-CM

## 2022-07-13 DIAGNOSIS — Q230 Congenital stenosis of aortic valve: Secondary | ICD-10-CM | POA: Diagnosis not present

## 2022-07-13 HISTORY — PX: RIGHT HEART CATH AND CORONARY ANGIOGRAPHY: CATH118264

## 2022-07-13 HISTORY — DX: Atherosclerotic heart disease of native coronary artery without angina pectoris: I25.10

## 2022-07-13 LAB — POCT I-STAT EG7
Acid-Base Excess: 1 mmol/L (ref 0.0–2.0)
Acid-Base Excess: 0 mmol/L (ref 0.0–2.0)
Bicarbonate: 26 mmol/L (ref 20.0–28.0)
Bicarbonate: 25.5 mmol/L (ref 20.0–28.0)
Calcium, Ion: 1.28 mmol/L (ref 1.15–1.40)
Calcium, Ion: 1.27 mmol/L (ref 1.15–1.40)
HCT: 36 % — ABNORMAL LOW (ref 39.0–52.0)
HCT: 36 % — ABNORMAL LOW (ref 39.0–52.0)
Hemoglobin: 12.2 g/dL — ABNORMAL LOW (ref 13.0–17.0)
Hemoglobin: 12.2 g/dL — ABNORMAL LOW (ref 13.0–17.0)
O2 Saturation: 72 %
O2 Saturation: 78 %
Potassium: 4.4 mmol/L (ref 3.5–5.1)
Potassium: 4.4 mmol/L (ref 3.5–5.1)
Sodium: 137 mmol/L (ref 135–145)
Sodium: 138 mmol/L (ref 135–145)
TCO2: 27 mmol/L (ref 22–32)
TCO2: 27 mmol/L (ref 22–32)
pCO2, Ven: 44.1 mmHg (ref 44–60)
pCO2, Ven: 43.6 mmHg — ABNORMAL LOW (ref 44–60)
pH, Ven: 7.379 (ref 7.25–7.43)
pH, Ven: 7.375 (ref 7.25–7.43)
pO2, Ven: 39 mmHg (ref 32–45)
pO2, Ven: 44 mmHg (ref 32–45)

## 2022-07-13 LAB — POCT I-STAT 7, (LYTES, BLD GAS, ICA,H+H)
Acid-base deficit: 1 mmol/L (ref 0.0–2.0)
Bicarbonate: 23.7 mmol/L (ref 20.0–28.0)
Calcium, Ion: 1.2 mmol/L (ref 1.15–1.40)
HCT: 35 % — ABNORMAL LOW (ref 39.0–52.0)
Hemoglobin: 11.9 g/dL — ABNORMAL LOW (ref 13.0–17.0)
O2 Saturation: 97 %
Potassium: 4.3 mmol/L (ref 3.5–5.1)
Sodium: 138 mmol/L (ref 135–145)
TCO2: 25 mmol/L (ref 22–32)
pCO2 arterial: 37.3 mmHg (ref 32–48)
pH, Arterial: 7.411 (ref 7.35–7.45)
pO2, Arterial: 89 mmHg (ref 83–108)

## 2022-07-13 LAB — CBC
HCT: 37.8 % — ABNORMAL LOW (ref 39.0–52.0)
Hemoglobin: 13.3 g/dL (ref 13.0–17.0)
MCH: 32.8 pg (ref 26.0–34.0)
MCHC: 35.2 g/dL (ref 30.0–36.0)
MCV: 93.1 fL (ref 80.0–100.0)
Platelets: 206 10*3/uL (ref 150–400)
RBC: 4.06 MIL/uL — ABNORMAL LOW (ref 4.22–5.81)
RDW: 12.9 % (ref 11.5–15.5)
WBC: 4.8 10*3/uL (ref 4.0–10.5)
nRBC: 0 % (ref 0.0–0.2)

## 2022-07-13 LAB — CREATININE, SERUM
Creatinine, Ser: 0.87 mg/dL (ref 0.61–1.24)
GFR, Estimated: >60 mL/min (ref >60)

## 2022-07-13 SURGERY — RIGHT HEART CATH AND CORONARY ANGIOGRAPHY
Anesthesia: LOCAL

## 2022-07-13 MED ORDER — SODIUM CHLORIDE 0.9% FLUSH: 3.0000 mL | INTRAVENOUS | Status: DC | PRN

## 2022-07-13 MED ORDER — ASPIRIN 81 MG PO CHEW: 81.0000 mg | CHEWABLE_TABLET | ORAL | Status: DC

## 2022-07-13 MED ORDER — ACETAMINOPHEN 325 MG PO TABS: 650.0000 mg | ORAL_TABLET | ORAL | Status: DC | PRN

## 2022-07-13 MED ORDER — SODIUM CHLORIDE 0.9 % WEIGHT BASED INFUSION: 1.0000 mL/kg/h | INTRAVENOUS | Status: DC

## 2022-07-13 MED ORDER — SODIUM CHLORIDE 0.9 % IV SOLN: 250.0000 mL | INTRAVENOUS | Status: DC | PRN

## 2022-07-13 MED ORDER — VERAPAMIL HCL 2.5 MG/ML IV SOLN
INTRAVENOUS | Status: AC
  Filled 2022-07-13: qty 2

## 2022-07-13 MED ORDER — ALPRAZOLAM 0.5 MG PO TABS
1.0000 mg | ORAL_TABLET | Freq: Every day | ORAL | Status: DC
  Administered 2022-07-13: 1 mg via ORAL
  Filled 2022-07-13: qty 2

## 2022-07-13 MED ORDER — ONDANSETRON HCL 4 MG/2ML IJ SOLN: 4.0000 mg | Freq: Four times a day (QID) | INTRAMUSCULAR | Status: DC | PRN

## 2022-07-13 MED ORDER — SODIUM CHLORIDE 0.9% FLUSH
3.0000 mL | Freq: Two times a day (BID) | INTRAVENOUS | Status: DC
  Administered 2022-07-13: 3 mL via INTRAVENOUS

## 2022-07-13 MED ORDER — ENOXAPARIN SODIUM 40 MG/0.4ML IJ SOSY
40.0000 mg | PREFILLED_SYRINGE | INTRAMUSCULAR | Status: DC
  Administered 2022-07-13: 40 mg via SUBCUTANEOUS
  Filled 2022-07-13: qty 0.4

## 2022-07-13 MED ORDER — FUROSEMIDE 20 MG PO TABS
20.0000 mg | ORAL_TABLET | Freq: Every day | ORAL | Status: DC
  Administered 2022-07-13: 20 mg via ORAL
  Filled 2022-07-13: qty 1

## 2022-07-13 MED ORDER — HEPARIN SODIUM (PORCINE) 1000 UNIT/ML IJ SOLN
INTRAMUSCULAR | Status: DC | PRN
  Administered 2022-07-13: 5000 [IU] via INTRAVENOUS

## 2022-07-13 MED ORDER — IOHEXOL 350 MG/ML SOLN
INTRAVENOUS | Status: DC | PRN
  Administered 2022-07-13: 55 mL

## 2022-07-13 MED ORDER — HYDRALAZINE HCL 20 MG/ML IJ SOLN: 10.0000 mg | INTRAMUSCULAR | Status: AC | PRN

## 2022-07-13 MED ORDER — FENTANYL CITRATE (PF) 100 MCG/2ML IJ SOLN
INTRAMUSCULAR | Status: AC
  Filled 2022-07-13: qty 2

## 2022-07-13 MED ORDER — ASPIRIN 81 MG PO TBEC: 81.0000 mg | DELAYED_RELEASE_TABLET | Freq: Every day | ORAL | Status: DC

## 2022-07-13 MED ORDER — SODIUM CHLORIDE 0.9% FLUSH: 3.0000 mL | Freq: Two times a day (BID) | INTRAVENOUS | Status: DC

## 2022-07-13 MED ORDER — LIDOCAINE HCL (PF) 1 % IJ SOLN
INTRAMUSCULAR | Status: DC | PRN
  Administered 2022-07-13: 5 mL

## 2022-07-13 MED ORDER — LIDOCAINE HCL (PF) 1 % IJ SOLN
INTRAMUSCULAR | Status: AC
  Filled 2022-07-13: qty 30

## 2022-07-13 MED ORDER — HEPARIN SODIUM (PORCINE) 1000 UNIT/ML IJ SOLN
INTRAMUSCULAR | Status: AC
  Filled 2022-07-13: qty 10

## 2022-07-13 MED ORDER — AMLODIPINE BESYLATE 5 MG PO TABS
5.0000 mg | ORAL_TABLET | Freq: Every morning | ORAL | Status: DC
  Administered 2022-07-14: 5 mg via ORAL
  Filled 2022-07-13: qty 1

## 2022-07-13 MED ORDER — SODIUM CHLORIDE 0.9 % IV SOLN: INTRAVENOUS | Status: AC

## 2022-07-13 MED ORDER — LABETALOL HCL 5 MG/ML IV SOLN: 10.0000 mg | INTRAVENOUS | Status: AC | PRN

## 2022-07-13 MED ORDER — SODIUM CHLORIDE 0.9 % IV SOLN: INTRAVENOUS | Status: DC

## 2022-07-13 MED ORDER — NITROGLYCERIN 0.4 MG SL SUBL: 0.4000 mg | SUBLINGUAL_TABLET | SUBLINGUAL | Status: DC | PRN

## 2022-07-13 MED ORDER — SODIUM CHLORIDE 0.9 % WEIGHT BASED INFUSION
3.0000 mL/kg/h | INTRAVENOUS | Status: DC
  Administered 2022-07-13: 3 mL/kg/h via INTRAVENOUS

## 2022-07-13 MED ORDER — ROPINIROLE HCL 0.5 MG PO TABS
0.5000 mg | ORAL_TABLET | Freq: Every day | ORAL | Status: DC
  Administered 2022-07-13: 0.5 mg via ORAL
  Filled 2022-07-13: qty 1

## 2022-07-13 MED ORDER — EZETIMIBE 10 MG PO TABS: 10.0000 mg | ORAL_TABLET | Freq: Every day | ORAL | Status: DC

## 2022-07-13 MED ORDER — HEPARIN (PORCINE) IN NACL 1000-0.9 UT/500ML-% IV SOLN
INTRAVENOUS | Status: DC | PRN
  Administered 2022-07-13 (×2): 500 mL

## 2022-07-13 MED ORDER — FENTANYL CITRATE (PF) 100 MCG/2ML IJ SOLN
INTRAMUSCULAR | Status: DC | PRN
  Administered 2022-07-13: 25 ug via INTRAVENOUS

## 2022-07-13 MED ORDER — MIDAZOLAM HCL 2 MG/2ML IJ SOLN
INTRAMUSCULAR | Status: AC
  Filled 2022-07-13: qty 2

## 2022-07-13 MED ORDER — MIDAZOLAM HCL 2 MG/2ML IJ SOLN
INTRAMUSCULAR | Status: DC | PRN
  Administered 2022-07-13: 1 mg via INTRAVENOUS

## 2022-07-13 MED ORDER — VERAPAMIL HCL 2.5 MG/ML IV SOLN
INTRAVENOUS | Status: DC | PRN
  Administered 2022-07-13: 10 mL via INTRA_ARTERIAL

## 2022-07-13 MED ORDER — HEPARIN (PORCINE) IN NACL 1000-0.9 UT/500ML-% IV SOLN
INTRAVENOUS | Status: AC
  Filled 2022-07-13: qty 1000

## 2022-07-13 MED ORDER — LISINOPRIL 20 MG PO TABS
20.0000 mg | ORAL_TABLET | Freq: Every morning | ORAL | Status: DC
  Administered 2022-07-14: 20 mg via ORAL
  Filled 2022-07-13: qty 1

## 2022-07-13 MED ORDER — ATORVASTATIN CALCIUM 10 MG PO TABS
20.0000 mg | ORAL_TABLET | Freq: Every evening | ORAL | Status: DC
  Administered 2022-07-13: 20 mg via ORAL
  Filled 2022-07-13: qty 2

## 2022-07-13 MED ORDER — CHLORTHALIDONE 25 MG PO TABS
25.0000 mg | ORAL_TABLET | Freq: Every morning | ORAL | Status: DC
  Administered 2022-07-14: 25 mg via ORAL
  Filled 2022-07-13: qty 1

## 2022-07-13 SURGICAL SUPPLY — 11 items
CATH DIAG 6FR JR4 (CATHETERS) IMPLANT
CATH OPTITORQUE TIG 4.0 6F (CATHETERS) IMPLANT
CATH SWAN GANZ 7F STRAIGHT (CATHETERS) IMPLANT
DEVICE RAD COMP TR BAND LRG (VASCULAR PRODUCTS) IMPLANT
GLIDESHEATH SLEND SS 6F .021 (SHEATH) IMPLANT
GLIDESHEATH SLENDER 7FR .021G (SHEATH) IMPLANT
KIT HEART LEFT (KITS) ×1 IMPLANT
PACK CARDIAC CATHETERIZATION (CUSTOM PROCEDURE TRAY) ×1 IMPLANT
TRANSDUCER W/STOPCOCK (MISCELLANEOUS) ×1 IMPLANT
TUBING CIL FLEX 10 FLL-RA (TUBING) ×1 IMPLANT
WIRE EMERALD 3MM-J .035X150CM (WIRE) IMPLANT

## 2022-07-13 NOTE — Interval H&P Note (Signed)
History and Physical Interval Note:  07/13/2022 11:43 AM  Craig Alvarez  has presented today for surgery, with the diagnosis of Aortic stenosis.  The various methods of treatment have been discussed with the patient and family. After consideration of risks, benefits and other options for treatment, the patient has consented to  Procedure(s): RIGHT/LEFT HEART CATH AND CORONARY ANGIOGRAPHY (N/A) as a surgical intervention.  The patient's history has been reviewed, patient examined, no change in status, stable for surgery.  I have reviewed the patient's chart and labs.  Questions were answered to the patient's satisfaction.     Early Osmond

## 2022-07-13 NOTE — Progress Notes (Signed)
TR BAND REMOVAL  LOCATION:    Right radial  DEFLATED PER PROTOCOL:    Yes.    TIME BAND OFF / DRESSING APPLIED:    1620pm a clean dry dressing applied with gauze and tegaderm   SITE UPON ARRIVAL:    Level 0  SITE AFTER BAND REMOVAL:    Level 0  CIRCULATION SENSATION AND MOVEMENT:    Within Normal Limits   Yes.    COMMENTS:   Care instructions given to patient

## 2022-07-14 ENCOUNTER — Encounter (HOSPITAL_COMMUNITY): Payer: Self-pay | Admitting: Internal Medicine

## 2022-07-14 ENCOUNTER — Telehealth: Payer: Self-pay | Admitting: Cardiology

## 2022-07-14 DIAGNOSIS — I2510 Atherosclerotic heart disease of native coronary artery without angina pectoris: Secondary | ICD-10-CM | POA: Diagnosis not present

## 2022-07-14 DIAGNOSIS — I350 Nonrheumatic aortic (valve) stenosis: Secondary | ICD-10-CM | POA: Diagnosis not present

## 2022-07-14 DIAGNOSIS — I110 Hypertensive heart disease with heart failure: Secondary | ICD-10-CM | POA: Diagnosis not present

## 2022-07-14 DIAGNOSIS — Z8616 Personal history of COVID-19: Secondary | ICD-10-CM | POA: Diagnosis not present

## 2022-07-14 DIAGNOSIS — I5032 Chronic diastolic (congestive) heart failure: Secondary | ICD-10-CM | POA: Diagnosis not present

## 2022-07-14 LAB — CBC
HCT: 38.1 % — ABNORMAL LOW (ref 39.0–52.0)
Hemoglobin: 13.4 g/dL (ref 13.0–17.0)
MCH: 32.8 pg (ref 26.0–34.0)
MCHC: 35.2 g/dL (ref 30.0–36.0)
MCV: 93.2 fL (ref 80.0–100.0)
Platelets: 212 10*3/uL (ref 150–400)
RBC: 4.09 MIL/uL — ABNORMAL LOW (ref 4.22–5.81)
RDW: 12.9 % (ref 11.5–15.5)
WBC: 6.1 10*3/uL (ref 4.0–10.5)
nRBC: 0 % (ref 0.0–0.2)

## 2022-07-14 LAB — BASIC METABOLIC PANEL
Anion gap: 10 (ref 5–15)
BUN: 20 mg/dL (ref 8–23)
CO2: 26 mmol/L (ref 22–32)
Calcium: 9.4 mg/dL (ref 8.9–10.3)
Chloride: 101 mmol/L (ref 98–111)
Creatinine, Ser: 0.94 mg/dL (ref 0.61–1.24)
GFR, Estimated: >60 mL/min (ref >60)
Glucose, Bld: 97 mg/dL (ref 70–99)
Potassium: 3.8 mmol/L (ref 3.5–5.1)
Sodium: 137 mmol/L (ref 135–145)

## 2022-07-14 MED ORDER — ENOXAPARIN SODIUM 60 MG/0.6ML IJ SOSY: 0.5000 mg/kg | PREFILLED_SYRINGE | INTRAMUSCULAR | Status: DC

## 2022-07-14 NOTE — Telephone Encounter (Signed)
Patient stated he had procedure and will need a doctor's note to be out of work for at least two weeks plus recovery time after his surgery.

## 2022-07-14 NOTE — Plan of Care (Signed)
Pt is progressing 

## 2022-07-14 NOTE — Telephone Encounter (Signed)
Letter completed. Pt states that he has an appointment with the surgeon on 07/21/22. Pt states that he will have his FMLA papers sent to Dr. Bettina Gavia.

## 2022-07-14 NOTE — Discharge Summary (Addendum)
Discharge Summary    Patient ID: Craig Alvarez MRN: [id number]; DOB: [birth year]  Admit date: 07/13/2022 Discharge date: 07/14/2022  PCP:  Raina Mina., MD   [place] Providers Cardiologist:  None        Discharge Diagnoses    Principal Problem:   Coronary artery disease    Diagnostic Studies/Procedures    07/14/21 LHC    Mid LM lesion is 20% stenosed.   1.  Minimal obstructive coronary artery disease. 2.  Fick cardiac output of 8.5 L/min and Fick cardiac index of 3.7 L/min/m with mean RA pressure of 9 mmHg, mean wedge pressure of 15 mmHg, and mean PA pressure of 23 mmHg.   Recommendation: Cardiothoracic surgical evaluation  Diagnostic Dominance: Right  _____________   History of Present Illness     Craig Alvarez is a 62 y.o. male with hx of severe and symptomatic aortic stenosis hypertension hyperlipidemia bicuspid aortic valve. Patient deferred undergoing evaluation for intervention with occupation and financial demands when he was last seen 05/26/2022 with heart failure. He had an echocardiogram performed 04/28/2022. The aortic valve was bicuspid with calcification and thickening mild aortic regurgitation and severe aortic stenosis and mean gradient 47 mmHg VTI aortic valve area 1.1 with a large LV outflow tract diameter of 25 mm. Left ventricular systolic function was low normal 50 to 55% with moderate LVH and moderate left atrial enlargement. Ascending aorta mildly enlarged 39 mm. Patient now wishes to proceed with preparation for aortic valve intervention and presented for LHC/RHC.  Hospital Course     Consultants: n/a     The patient underwent cardiac cath as noted above with Dr. Ali Lowe. There were no observed complications post cath. Radial cath site was re-evaluated prior to discharge and found to be stable without any complications. Instructions/precautions regarding cath site care were given prior to discharge.  Craig Alvarez was seen by Dr.  Marlou Porch and determined stable for discharge home. Follow up with our office and CT surgery has been arranged. Medications are listed below.    Did the patient have an acute coronary syndrome (MI, NSTEMI, STEMI, etc) this admission?:  No                               Did the patient have a percutaneous coronary intervention (stent / angioplasty)?:  No.          _____________  Discharge Vitals Blood pressure (!) 143/89, pulse 83, temperature 97.6 F (36.4 C), temperature source Oral, resp. rate 17, height 6' (1.829 m), weight 106.1 kg, SpO2 95 %.  Filed Weights   07/13/22 1112  Weight: 106.1 kg    Physical Exam Vitals and nursing note reviewed.  Constitutional:      Appearance: Normal appearance.  HENT:     Head: Normocephalic and atraumatic.     Nose: Nose normal.     Mouth/Throat:     Mouth: Mucous membranes are moist.  Eyes:     Pupils: Pupils are equal, round, and reactive to light.  Cardiovascular:     Rate and Rhythm: Normal rate and regular rhythm.     Heart sounds: Murmur heard.  Pulmonary:     Effort: Pulmonary effort is normal.     Breath sounds: Normal breath sounds.  Abdominal:     General: Abdomen is flat.     Palpations: Abdomen is soft.  Musculoskeletal:        General: Normal range  of motion.     Cervical back: Normal range of motion and neck supple.  Skin:    General: Skin is warm and dry.     Capillary Refill: Capillary refill takes less than 2 seconds.  Neurological:     General: No focal deficit present.     Mental Status: He is alert and oriented to person, place, and time.  Psychiatric:        Mood and Affect: Mood normal.        Behavior: Behavior normal.        Thought Content: Thought content normal.        Judgment: Judgment normal.      Labs & Radiologic Studies    CBC Recent Labs    07/13/22 1849 07/14/22 0021  WBC 4.8 6.1  HGB 13.3 13.4  HCT 37.8* 38.1*  MCV 93.1 93.2  PLT 206 644   Basic Metabolic Panel Recent Labs     07/13/22 1320 07/13/22 1849 07/14/22 0021  NA 137  --  137  K 4.4  --  3.8  CL  --   --  101  CO2  --   --  26  GLUCOSE  --   --  97  BUN  --   --  20  CREATININE  --  0.87 0.94  CALCIUM  --   --  9.4   Liver Function Tests No results for input(s): "AST", "ALT", "ALKPHOS", "BILITOT", "PROT", "ALBUMIN" in the last 72 hours. No results for input(s): "LIPASE", "AMYLASE" in the last 72 hours. High Sensitivity Troponin:   No results for input(s): "TROPONINIHS" in the last 720 hours.  BNP Invalid input(s): "POCBNP" D-Dimer No results for input(s): "DDIMER" in the last 72 hours. Hemoglobin A1C No results for input(s): "HGBA1C" in the last 72 hours. Fasting Lipid Panel No results for input(s): "CHOL", "HDL", "LDLCALC", "TRIG", "CHOLHDL", "LDLDIRECT" in the last 72 hours. Thyroid Function Tests No results for input(s): "TSH", "T4TOTAL", "T3FREE", "THYROIDAB" in the last 72 hours.  Invalid input(s): "FREET3" _____________  CARDIAC CATHETERIZATION  Result Date: 07/13/2022   Mid LM lesion is 20% stenosed. 1.  Minimal obstructive coronary artery disease. 2.  Fick cardiac output of 8.5 L/min and Fick cardiac index of 3.7 L/min/m with mean RA pressure of 9 mmHg, mean wedge pressure of 15 mmHg, and mean PA pressure of 23 mmHg. Recommendation: Cardiothoracic surgical evaluation.   Disposition   Pt is being discharged home today in good condition.  Follow-up Plans & Appointments        Discharge Medications   Allergies as of 07/14/2022   No Known Allergies      Medication List     TAKE these medications    ALPRAZolam 1 MG tablet Commonly known as: XANAX Take 1 mg by mouth at bedtime.   amLODipine 5 MG tablet Commonly known as: NORVASC Take 5 mg by mouth in the morning.   amoxicillin 500 MG tablet Commonly known as: AMOXIL Take 2,000 mg by mouth See admin instructions. Take 1 hour before dental procedures   aspirin 81 MG chewable tablet Chew 81 mg by mouth in the  morning.   atorvastatin 20 MG tablet Commonly known as: LIPITOR Take 20 mg by mouth every evening.   CALCIUM-VITAMIN D PO Take 1 tablet by mouth in the morning.   chlorthalidone 25 MG tablet Commonly known as: HYGROTON Take 25 mg by mouth in the morning.   eszopiclone 2 MG Tabs tablet Commonly known as: LUNESTA Take  2 mg by mouth at bedtime as needed for sleep.   ezetimibe 10 MG tablet Commonly known as: ZETIA Take 1 tablet (10 mg total) by mouth daily.   furosemide 20 MG tablet Commonly known as: LASIX Take 1 tablet (20 mg total) by mouth daily.   ibuprofen 200 MG tablet Commonly known as: ADVIL Take 600 mg by mouth every 8 (eight) hours as needed (pain.).   lisinopril 20 MG tablet Commonly known as: ZESTRIL Take 20 mg by mouth in the morning.   multivitamin tablet Take 1 tablet by mouth in the morning.   Omega-3 1000 MG Caps Take 1,000 mg by mouth in the morning.   rOPINIRole 0.5 MG tablet Commonly known as: REQUIP Take 0.5 mg by mouth at bedtime.   sildenafil 20 MG tablet Commonly known as: REVATIO Take 60-80 mg by mouth daily as needed (erectile dysfunction).           Outstanding Labs/Studies    Duration of Discharge Encounter   Greater than 30 minutes including physician time.  Signed, Perlie Gold, PA-C 07/14/2022, 8:12 AM   Personally seen and examined. Agree with above.  62 year old with severe aortic stenosis, catheterization site clean dry and intact, ambulating well.  Bicuspid aortic valve.  Awaiting surgical consultation, has appointment.  Overall doing well currently.  No chest pain no shortness of breath.  Hemoglobin 13.4 creatinine 0.94.  Okay for discharge.  Discussed with him.  Donato Schultz, MD

## 2022-07-17 ENCOUNTER — Telehealth: Payer: Self-pay | Admitting: Cardiology

## 2022-07-17 NOTE — Telephone Encounter (Signed)
Patient calling in because he needs a note for work, he states he can come by the office to pick it up. Please advis\e

## 2022-07-17 NOTE — Telephone Encounter (Signed)
Called patient and informed him Craig Hidden, RN had done a note for his work on Friday 07-14-22, however patient states he was having trouble getting the letter to pull up on my chart.  I told him I would print it and leave in the front office for him to pick up. He thanked me for the call and had no additional questions.

## 2022-07-19 ENCOUNTER — Encounter: Payer: 59 | Admitting: Surgery

## 2022-07-21 ENCOUNTER — Institutional Professional Consult (permissible substitution): Payer: 59 | Admitting: Surgery

## 2022-07-21 ENCOUNTER — Encounter: Payer: Self-pay | Admitting: *Deleted

## 2022-07-21 ENCOUNTER — Encounter: Payer: Self-pay | Admitting: Surgery

## 2022-07-21 ENCOUNTER — Other Ambulatory Visit: Payer: Self-pay | Admitting: *Deleted

## 2022-07-21 VITALS — BP 121/78 | HR 85 | Resp 20 | Ht 72.0 in | Wt 232.0 lb

## 2022-07-21 DIAGNOSIS — Q231 Congenital insufficiency of aortic valve: Secondary | ICD-10-CM

## 2022-07-21 DIAGNOSIS — I350 Nonrheumatic aortic (valve) stenosis: Secondary | ICD-10-CM

## 2022-07-21 NOTE — Progress Notes (Signed)
Cardiothoracic Surgery Admission History and Physical  PCP is Raina Mina., MD Referring Provider is Early Osmond, MD  Chief Complaint  Patient presents with   Aortic Stenosis    New patient consultation, CATH 1/4, ECHO 10/20    HPI:  The patient is a 62 year old gentleman with history of hyperlipidemia, prediabetes, and bicuspid aortic valve disease that has been followed with periodic echocardiogram.  His most recent echocardiogram on 04/28/2022 showed a bicuspid aortic valve with thickening and calcification.  Mean gradient was 47.2 mmHg with a peak gradient of 68.4 mmHg.  Dimensionless index was 0.26 with an aortic valve area 1.1 cm.  There was mild aortic insufficiency.  Left ventricular ejection fraction was 50 to 55% with moderate LVH and grade 1 diastolic dysfunction.  He was seen in November 2023 with congestive heart failure with lower extremity edema.  He improved with diuretic therapy.  He did not wish to proceed with further workup for surgery at that time due to his job and financial demands.  Cardiac catheterization on 07/13/2022 showed minimal non-obstructive coronary disease.  Right heart pressures are normal.  He reports about a 33-month history of progressive exertional shortness of breath and fatigue.  He denies any chest pain or pressure.  Has had no dizziness or syncope.  He has had bilateral lower extremity edema for the past several months.  He denies orthopnea and PND.  He reports seeing his dentist recently and had an orthopantogram and dental cleaning and was told that he had no active dental disease and no contraindication to surgery.  He works for a [employer] and has a very strenuous job.  He has been there for almost 30 years. He lives in [place]. Past Medical History:  Diagnosis Date   Acute respiratory disease due to COVID-19 virus 04/26/2020   Anxiety 01/21/2019   Bicuspid aortic valve    Chronic bilateral low back pain  without sciatica 12/12/2017   Depression    Dyslipidemia 12/21/2015   ED (erectile dysfunction) 11/04/2015   Essential hypertension 01/21/2019   Hypercholesteremia    Insomnia 01/21/2019   Mixed hyperlipidemia 01/21/2019   Nonrheumatic aortic valve stenosis 12/21/2015   Prediabetes 11/04/2015   Restless legs syndrome (RLS) 01/21/2019   Tear of meniscus of knee 12/27/2018    Past Surgical History:  Procedure Laterality Date   KNEE ARTHROSCOPY WITH MENISCAL REPAIR Right 2020   RIGHT HEART CATH AND CORONARY ANGIOGRAPHY N/A 07/13/2022   Procedure: RIGHT HEART CATH AND CORONARY ANGIOGRAPHY;  Surgeon: Early Osmond, MD;  Location: [facility];  Service: Cardiovascular;  Laterality: N/A;    Family History  Problem Relation Age of Onset   COPD Mother    Heart attack Father    Diabetes Brother    Cancer Maternal Grandmother     Social History Social History   Tobacco Use   Smoking status: Former    Types: Cigarettes    Quit date: 1999    Years since quitting: 25.0   Smokeless tobacco: Never  Vaping Use   Vaping Use: Never used  Substance Use Topics   Alcohol use: Yes    Alcohol/week: 12.0 - 14.0 standard drinks of alcohol    Types: 12 - 14 Glasses of wine per week   Drug use: Yes    Types: Marijuana    Current Outpatient Medications  Medication Sig Dispense Refill   ALPRAZolam (XANAX) 1 MG tablet Take 1 mg by mouth at bedtime.  amLODipine (NORVASC) 5 MG tablet Take 5 mg by mouth in the morning.     amoxicillin (AMOXIL) 500 MG tablet Take 2,000 mg by mouth See admin instructions. Take 1 hour before dental procedures     aspirin 81 MG chewable tablet Chew 81 mg by mouth in the morning.     atorvastatin (LIPITOR) 20 MG tablet Take 20 mg by mouth every evening.     CALCIUM-VITAMIN D PO Take 1 tablet by mouth in the morning.     chlorthalidone (HYGROTON) 25 MG tablet Take 25 mg by mouth in the morning.     eszopiclone (LUNESTA) 2 MG TABS tablet Take 2 mg by mouth  at bedtime as needed for sleep.     ezetimibe (ZETIA) 10 MG tablet Take 1 tablet (10 mg total) by mouth daily. 90 tablet 3   furosemide (LASIX) 20 MG tablet Take 1 tablet (20 mg total) by mouth daily. 30 tablet 1   ibuprofen (ADVIL) 200 MG tablet Take 600 mg by mouth every 8 (eight) hours as needed (pain.).     lisinopril (ZESTRIL) 20 MG tablet Take 20 mg by mouth in the morning.     Multiple Vitamin (MULTIVITAMIN) tablet Take 1 tablet by mouth in the morning.     Omega-3 1000 MG CAPS Take 1,000 mg by mouth in the morning.     rOPINIRole (REQUIP) 0.5 MG tablet Take 0.5 mg by mouth at bedtime.     sildenafil (REVATIO) 20 MG tablet Take 60-80 mg by mouth daily as needed (erectile dysfunction).     No current facility-administered medications for this visit.    No Known Allergies  Review of Systems  Constitutional:  Positive for activity change and fatigue.  HENT: Negative.    Eyes: Negative.   Respiratory:  Positive for shortness of breath.   Cardiovascular:  Positive for leg swelling. Negative for chest pain.  Gastrointestinal: Negative.   Endocrine: Negative.   Genitourinary: Negative.   Musculoskeletal: Negative.   Skin: Negative.   Neurological:  Negative for dizziness and syncope.  Hematological: Negative.   Psychiatric/Behavioral: Negative.      BP 121/78 (BP Location: Right Arm, Patient Position: Sitting, Cuff Size: Large)   Pulse 85   Resp 20   Ht 6' (1.829 m)   Wt 232 lb (105.2 kg)   SpO2 95% Comment: RA  BMI 31.46 kg/m  Physical Exam Constitutional:      Appearance: Normal appearance. He is obese.  HENT:     Head: Normocephalic and atraumatic.     Mouth/Throat:     Mouth: Mucous membranes are moist.     Pharynx: Oropharynx is clear.  Eyes:     Extraocular Movements: Extraocular movements intact.     Conjunctiva/sclera: Conjunctivae normal.     Pupils: Pupils are equal, round, and reactive to light.  Neck:     Vascular: No carotid bruit.  Cardiovascular:      Rate and Rhythm: Normal rate and regular rhythm.     Pulses: Normal pulses.     Heart sounds: Murmur heard.     Comments: 3/6 systolic murmur RSB, no diastolic murmur Pulmonary:     Effort: Pulmonary effort is normal.     Breath sounds: Normal breath sounds.  Abdominal:     Tenderness: There is no abdominal tenderness.  Musculoskeletal:     Cervical back: Normal range of motion and neck supple.     Comments: Mild edema bilaterally  Skin:    General: Skin is  warm and dry.  Neurological:     General: No focal deficit present.     Mental Status: He is alert and oriented to person, place, and time.  Psychiatric:        Mood and Affect: Mood normal.        Behavior: Behavior normal.      Diagnostic Tests:  ECHOCARDIOGRAM REPORT       Patient Name:   Craig Alvarez Date of Exam: 04/28/2022  Medical Rec #:  [id number]    Height:       72.0 in  Accession #:    [id number]   Weight:       230.0 lb  Date of Birth:  1960-12-02     BSA:          2.261 m  Patient Age:    2 years     BP:           110/80 mmHg  Patient Gender: M            HR:           71 bpm.  Exam Location:  [facility]   Procedure: 2D Echo, Cardiac Doppler, Color Doppler and Strain Analysis   Indications:    Bicuspid aortic valve [Q23.1 (ICD-10-CM)]; Essential                  hypertension [I10 (ICD-10-CM)]; Mixed hyperlipidemia  [E78.2                 (ICD-10-CM)]; Nonrheumatic aortic valve stenosis [I35.0                  (ICD-10-CM)]    History:        Patient has prior history of Echocardiogram examinations,  most                 recent 01/22/2019. Aortic Valve Disease; Risk                  Factors:Hypertension and Dyslipidemia.    Sonographer:    Philipp Deputy RDCS  Referring Phys: [id number] [place]     1. GLS -14.3. Left ventricular ejection fraction, by estimation, is 50 to  55%. The left ventricle has low normal function. The left ventricle has no  regional wall motion  abnormalities. There is moderate left ventricular  hypertrophy. Left ventricular  diastolic parameters are consistent with Grade I diastolic dysfunction  (impaired relaxation).   2. Right ventricular systolic function is normal. The right ventricular  size is normal.   3. Left atrial size was moderately dilated.   4. The mitral valve is normal in structure. Mild mitral valve  regurgitation. No evidence of mitral stenosis.   5. DI 0.26, AVA 1.1 ( Large LVOT diameter - 25 mm), gradient indicate  severe AS. The aortic valve is bicuspid. There is mild calcification of  the aortic valve. There is moderate thickening of the aortic valve. Aortic  valve regurgitation is mild.  Moderate to severe aortic valve stenosis. Aortic valve mean gradient  measures 47.2 mmHg.   6. There is mild dilatation of the ascending aorta, measuring 39 mm.   7. The inferior vena cava is normal in size with greater than 50%  respiratory variability, suggesting right atrial pressure of 3 mmHg.   FINDINGS   Left Ventricle: GLS -14.3. Left ventricular ejection fraction, by  estimation, is 50 to 55%. The left ventricle has low normal function. The  left  ventricle has no regional wall motion abnormalities. The left  ventricular internal cavity size was normal in   size. There is moderate left ventricular hypertrophy. Left ventricular  diastolic parameters are consistent with Grade I diastolic dysfunction  (impaired relaxation).   Right Ventricle: The right ventricular size is normal. No increase in  right ventricular wall thickness. Right ventricular systolic function is  normal.   Left Atrium: Left atrial size was moderately dilated.   Right Atrium: Right atrial size was normal in size.   Pericardium: There is no evidence of pericardial effusion.   Mitral Valve: The mitral valve is normal in structure. Mild mitral valve  regurgitation. No evidence of mitral valve stenosis.   Tricuspid Valve: The tricuspid  valve is normal in structure. Tricuspid  valve regurgitation is not demonstrated. No evidence of tricuspid  stenosis.   Aortic Valve: DI 0.26, AVA 1.1 ( Large LVOT diameter - 25 mm), gradient  indicate severe AS. The aortic valve is bicuspid. There is mild  calcification of the aortic valve. There is moderate thickening of the  aortic valve. Aortic valve regurgitation is  mild. Aortic regurgitation PHT measures 347 msec. Moderate to severe  aortic stenosis is present. Aortic valve mean gradient measures 47.2 mmHg.  Aortic valve peak gradient measures 68.4 mmHg. Aortic valve area, by VTI  measures 1.28 cm.   Pulmonic Valve: The pulmonic valve was normal in structure. Pulmonic valve  regurgitation is mild. No evidence of pulmonic stenosis.   Aorta: The aortic root is normal in size and structure. There is mild  dilatation of the ascending aorta, measuring 39 mm.   Venous: The inferior vena cava is normal in size with greater than 50%  respiratory variability, suggesting right atrial pressure of 3 mmHg.   IAS/Shunts: No atrial level shunt detected by color flow Doppler.     LEFT VENTRICLE  PLAX 2D  LVIDd:         5.30 cm   Diastology  LVIDs:         3.50 cm   LV e' medial:    5.66 cm/s  LV PW:         1.50 cm   LV E/e' medial:  11.7  LV IVS:        1.60 cm   LV e' lateral:   7.62 cm/s  LVOT diam:     2.50 cm   LV E/e' lateral: 8.7  LV SV:         143  LV SV Index:   63  LVOT Area:     4.91 cm     RIGHT VENTRICLE             IVC  RV Basal diam:  3.40 cm     IVC diam: 1.50 cm  RV Mid diam:    2.70 cm  RV S prime:     10.20 cm/s  TAPSE (M-mode): 2.5 cm   LEFT ATRIUM             Index        RIGHT ATRIUM           Index  LA diam:        4.10 cm 1.81 cm/m   RA Area:     16.80 cm  LA Vol (A2C):   98.1 ml 43.39 ml/m  RA Volume:   42.30 ml  18.71 ml/m  LA Vol (A4C):   91.5 ml 40.47 ml/m  LA Biplane Vol: 97.7 ml 43.21  ml/m   AORTIC VALVE                     PULMONIC VALVE   AV Area (Vmax):    1.22 cm      PR End Diast Vel: 4.58 msec  AV Area (Vmean):   1.11 cm  AV Area (VTI):     1.28 cm  AV Vmax:           413.60 cm/s  AV Vmean:          330.800 cm/s  AV VTI:            1.118 m  AV Peak Grad:      68.4 mmHg  AV Mean Grad:      47.2 mmHg  LVOT Vmax:         103.00 cm/s  LVOT Vmean:        75.100 cm/s  LVOT VTI:          0.291 m  LVOT/AV VTI ratio: 0.26  AI PHT:            347 msec    AORTA  Ao Root diam: 4.10 cm  Ao Asc diam:  3.90 cm   MITRAL VALVE  MV Area (PHT): 2.39 cm    SHUNTS  MV Decel Time: 317 msec    Systemic VTI:  0.29 m  MR Peak grad: 123.7 mmHg   Systemic Diam: 2.50 cm  MR Mean grad: 93.0 mmHg  MR Vmax:      556.00 cm/s  MR Vmean:     467.0 cm/s  MV E velocity: 66.20 cm/s  MV A velocity: 70.00 cm/s  MV E/A ratio:  0.95   Gypsy Balsamobert Krasowski MD  Electronically signed by Gypsy Balsamobert Krasowski MD  Signature Date/Time: 04/28/2022/5:54:24 PM        Final      ysicians  Panel Physicians Referring Physician Case Authorizing Physician  Orbie Pyohukkani, Arun K, MD (Primary)     Procedures  RIGHT HEART CATH AND CORONARY ANGIOGRAPHY   Conclusion      Mid LM lesion is 20% stenosed.   1.  Minimal obstructive coronary artery disease. 2.  Fick cardiac output of 8.5 L/min and Fick cardiac index of 3.7 L/min/m with mean RA pressure of 9 mmHg, mean wedge pressure of 15 mmHg, and mean PA pressure of 23 mmHg.   Recommendation: Cardiothoracic surgical evaluation.   Indications  Aortic stenosis due to bicuspid aortic valve [Q23.0, Q23.1 (ICD-10-CM)]   Procedural Details  Technical Details The patient is a 62 year old male with a history of severe symptomatic bicuspid aortic valve stenosis, hypertension, and hyperlipidemia who was referred for preprocedural assessment prior to surgical aortic valve replacement.  After obtaining consent the patient brought to the cardiac catheterization) prepped draped sterile fashion.  Xylocaine was used  anesthetize the right wrist in the area around previous placed antecubital IV.  Antecubital IV was exchanged for 7 French sheath and a 6 French femoral glide sheath was placed in the right radial artery.  5000 units heparin 5 mg verapamil were administered through the sheath.  Coronary angiography was performed with a 6 [place] Tig catheter and JR4 catheter.  Right heart catheterization performed with the 7 [place] Swan catheter.  A TR band was placed and manual pressure applied to the right antecubital site.  There are no acute complications. Estimated blood loss <50 mL.   During this procedure medications were administered to achieve and maintain moderate conscious sedation while  the patient's heart rate, blood pressure, and oxygen saturation were continuously monitored and I was present face-to-face 100% of this time.   Medications (Filter: Administrations occurring from 1225 to 1339 on 07/13/22) Heparin (Porcine) in NaCl 1000-0.9 UT/500ML-% SOLN (mL)  Total volume: 1,000 mL Date/Time Rate/Dose/Volume Action   07/13/22 1230 500 mL Given   1230 500 mL Given   fentaNYL (SUBLIMAZE) injection (mcg)  Total dose: 25 mcg Date/Time Rate/Dose/Volume Action   07/13/22 1306 25 mcg Given   midazolam (VERSED) injection (mg)  Total dose: 1 mg Date/Time Rate/Dose/Volume Action   07/13/22 1306 1 mg Given   lidocaine (PF) (XYLOCAINE) 1 % injection (mL)  Total volume: 5 mL Date/Time Rate/Dose/Volume Action   07/13/22 1309 5 mL Given   heparin sodium (porcine) injection (Units)  Total dose: 5,000 Units Date/Time Rate/Dose/Volume Action   07/13/22 1311 5,000 Units Given   Radial Cocktail/Verapamil only (mL)  Total volume: 10 mL Date/Time Rate/Dose/Volume Action   07/13/22 1312 10 mL Given   iohexol (OMNIPAQUE) 350 MG/ML injection (mL)  Total volume: 55 mL Date/Time Rate/Dose/Volume Action   07/13/22 1334 55 mL Given    Sedation Time  Sedation Time Physician-1: 27 minutes 6  seconds Contrast  Medication Name Total Dose  iohexol (OMNIPAQUE) 350 MG/ML injection 55 mL   Radiation/Fluoro  Fluoro time: 7.4 (min) DAP: 19524 (mGycm2) Cumulative Air Kerma: 281 (mGy) Complications  Complications documented before study signed (07/13/2022  1:49 PM)   No complications were associated with this study.  Documented by Ledell Peoples, RN - 07/13/2022  1:38 PM     Coronary Findings  Diagnostic Dominance: Right Left Main  Mid LM lesion is 20% stenosed.    Left Anterior Descending  The vessel exhibits minimal luminal irregularities.    Right Coronary Artery  The vessel exhibits minimal luminal irregularities.    Intervention   No interventions have been documented.   Coronary Diagrams  Diagnostic Dominance: Right  Intervention   Implants   No implant documentation for this case.   Syngo Images   Show images for CARDIAC CATHETERIZATION Images on Long Term Storage   Show images for [place], Police to Procedure Log  Procedure Log    Hemo Data  Flowsheet Row Most Recent Value  Fick Cardiac Output 8.55 L/min  Fick Cardiac Output Index 3.76 (L/min)/BSA  RA A Wave 12 mmHg  RA V Wave 11 mmHg  RA Mean 9 mmHg  RV Systolic Pressure 33 mmHg  RV Diastolic Pressure 9 mmHg  RV EDP 13 mmHg  PA Systolic Pressure 30 mmHg  PA Diastolic Pressure 17 mmHg  PA Mean 23 mmHg  PW A Wave 23 mmHg  PW V Wave 19 mmHg  PW Mean 15 mmHg  AO Systolic Pressure 120 mmHg  AO Diastolic Pressure 71 mmHg  AO Mean 92 mmHg  QP/QS 0.76  TPVR Index 8.06 HRUI  TSVR Index 24.49 HRUI  PVR SVR Ratio 0.13  TPVR/TSVR Ratio 0.33    Impression:  This 62 year old gentleman has stage D, severe, symptomatic aortic stenosis with NYHA class III symptoms of exertional fatigue and shortness of breath consistent with chronic diastolic congestive heart failure.  I agree that aortic valve replacement is indicated in this patient for relief of his symptoms and to prevent  progressive left ventricular dysfunction.  Given his relatively young age I think that open surgical aortic valve replacement is the best option for him.  His aortic root and ascending aorta appear to be of normal size  on echocardiogram.  I discussed the alternatives of mechanical and bioprosthetic valves with him.  He does not want to be on Coumadin and would like to use a bioprosthetic valve.  I think that is very reasonable at his age. I discussed the operative procedure with the patient including alternatives, benefits and risks; including but not limited to bleeding, blood transfusion, infection, stroke, myocardial infarction, graft failure, heart block requiring a permanent pacemaker, organ dysfunction, and death.  Craig Alvarez understands and agrees to proceed.       Plan:  He will be scheduled for aortic valve replacement using a bioprosthetic valve on Friday, 08/04/2022.  I spent 60 minutes performing this consultation and > 50% of this time was spent face to face counseling and coordinating the care of this patient's severe symptomatic aortic stenosis.   Alleen Borne, MD [facility] [phone]

## 2022-07-22 ENCOUNTER — Other Ambulatory Visit: Payer: Self-pay | Admitting: Cardiology

## 2022-07-24 ENCOUNTER — Telehealth: Payer: Self-pay

## 2022-07-24 NOTE — Telephone Encounter (Signed)
STD form completed and faxed to [facility] @ [phone]. Beginning LOA 07/21/22 (consult per BKB) DOS- 08/04/22. RTW date 10/30/22. Elyn Peers mailed to home address.

## 2022-08-01 NOTE — Pre-Procedure Instructions (Signed)
Surgical Instructions    Your procedure is scheduled on Friday, January 26th.  Report to [facility] Main Entrance "A" at 5:30 A.M., then check in with the Admitting office.  Call this number if you have problems the morning of surgery:  [phone]  If you have any questions prior to your surgery date call [phone]: Open Monday-Friday 8am-4pm If you experience any cold or flu symptoms such as cough, fever, chills, shortness of breath, etc. between now and your scheduled surgery, please notify us at the above number.     Remember:  Do not eat or drink after midnight the night before your surgery    Take these medicines the morning of surgery with A SIP OF WATER  amLODipine (NORVASC)  ezetimibe (ZETIA)    Take these medications AS NEEDED: loratadine (CLARITIN) ALPRAZolam (XANAX)   As of today, STOP taking any Aspirin (unless otherwise instructed by your surgeon) Aleve, Naproxen, Ibuprofen, Motrin, Advil, Goody's, BC's, all herbal medications, fish oil, and all vitamins.                     Do NOT Smoke (Tobacco/Vaping) for 24 hours prior to your procedure.  If you use a CPAP at night, you may bring your mask/headgear for your overnight stay.   Contacts, glasses, piercing's, hearing aid's, dentures or partials may not be worn into surgery, please bring cases for these belongings.    For patients admitted to the hospital, discharge time will be determined by your treatment team.   Patients discharged the day of surgery will not be allowed to drive home, and someone needs to stay with them for 24 hours.  SURGICAL WAITING ROOM VISITATION Patients having surgery or a procedure may have no more than 2 support people in the waiting area - these visitors may rotate.   Children under the age of 7 must have an adult with them who is not the patient. If the patient needs to stay at the hospital during part of their recovery, the visitor guidelines for inpatient rooms apply. Pre-op  nurse will coordinate an appropriate time for 1 support person to accompany patient in pre-op.  This support person may not rotate.   Please refer to the [facility] website for the visitor guidelines for Inpatients (after your surgery is over and you are in a regular room).    Special instructions:   [facility]- Preparing For Surgery  Before surgery, you can play an important role. Because skin is not sterile, your skin needs to be as free of germs as possible. You can reduce the number of germs on your skin by washing with CHG (chlorahexidine gluconate) Soap before surgery.  CHG is an antiseptic cleaner which kills germs and bonds with the skin to continue killing germs even after washing.    Oral Hygiene is also important to reduce your risk of infection.  Remember - BRUSH YOUR TEETH THE MORNING OF SURGERY WITH YOUR REGULAR TOOTHPASTE  Please do not use if you have an allergy to CHG or antibacterial soaps. If your skin becomes reddened/irritated stop using the CHG.  Do not shave (including legs and underarms) for at least 48 hours prior to first CHG shower. It is OK to shave your face.  Please follow these instructions carefully.   Shower the NIGHT BEFORE SURGERY and the MORNING OF SURGERY  If you chose to wash your hair, wash your hair first as usual with your normal shampoo.  After you shampoo, rinse your hair  and body thoroughly to remove the shampoo.  Use CHG Soap as you would any other liquid soap. You can apply CHG directly to the skin and wash gently with a scrungie or a clean washcloth.   Apply the CHG Soap to your body ONLY FROM THE NECK DOWN.  Do not use on open wounds or open sores. Avoid contact with your eyes, ears, mouth and genitals (private parts). Wash Face and genitals (private parts)  with your normal soap.   Wash thoroughly, paying special attention to the area where your surgery will be performed.  Thoroughly rinse your body with warm water from the neck  down.  DO NOT shower/wash with your normal soap after using and rinsing off the CHG Soap.  Pat yourself dry with a CLEAN TOWEL.  Wear CLEAN PAJAMAS to bed the night before surgery  Place CLEAN SHEETS on your bed the night before your surgery  DO NOT SLEEP WITH PETS.   Day of Surgery: Take a shower with CHG soap. Do not wear jewelry Do not wear lotions, powders, colognes, or deodorant. Men may shave face and neck. Do not bring valuables to the hospital.  [facility] is not responsible for any belongings or valuables. Wear Clean/Comfortable clothing the morning of surgery Remember to brush your teeth WITH YOUR REGULAR TOOTHPASTE.   Please read over the following fact sheets that you were given.    If you received a COVID test during your pre-op visit  it is requested that you wear a mask when out in public, stay away from anyone that may not be feeling well and notify your surgeon if you develop symptoms. If you have been in contact with anyone that has tested positive in the last 10 days please notify you surgeon.

## 2022-08-02 ENCOUNTER — Encounter (HOSPITAL_COMMUNITY): Payer: Self-pay

## 2022-08-02 ENCOUNTER — Other Ambulatory Visit: Payer: Self-pay

## 2022-08-02 ENCOUNTER — Ambulatory Visit (HOSPITAL_BASED_OUTPATIENT_CLINIC_OR_DEPARTMENT_OTHER)
Admission: RE | Admit: 2022-08-02 | Discharge: 2022-08-02 | Disposition: A | Discharge status: Home / Self Care | Payer: 59 | Source: Ambulatory Visit | Attending: Surgery | Admitting: Surgery

## 2022-08-02 ENCOUNTER — Encounter (HOSPITAL_COMMUNITY)
Admission: RE | Admit: 2022-08-02 | Discharge: 2022-08-02 | Disposition: A | Discharge status: Home / Self Care | Payer: 59 | Source: Ambulatory Visit | Attending: Surgery | Admitting: Surgery

## 2022-08-02 ENCOUNTER — Ambulatory Visit (HOSPITAL_COMMUNITY)
Admission: RE | Admit: 2022-08-02 | Discharge: 2022-08-02 | Disposition: A | Discharge status: Home / Self Care | Payer: 59 | Source: Ambulatory Visit | Attending: Surgery | Admitting: Surgery

## 2022-08-02 VITALS — BP 126/97 | HR 77 | Temp 97.5°F | Resp 18 | Ht 72.0 in | Wt 239.0 lb

## 2022-08-02 DIAGNOSIS — I350 Nonrheumatic aortic (valve) stenosis: Secondary | ICD-10-CM

## 2022-08-02 DIAGNOSIS — I10 Essential (primary) hypertension: Secondary | ICD-10-CM | POA: Insufficient documentation

## 2022-08-02 DIAGNOSIS — Z1152 Encounter for screening for COVID-19: Secondary | ICD-10-CM | POA: Insufficient documentation

## 2022-08-02 DIAGNOSIS — Z01818 Encounter for other preprocedural examination: Secondary | ICD-10-CM

## 2022-08-02 DIAGNOSIS — I2510 Atherosclerotic heart disease of native coronary artery without angina pectoris: Secondary | ICD-10-CM | POA: Insufficient documentation

## 2022-08-02 DIAGNOSIS — E785 Hyperlipidemia, unspecified: Secondary | ICD-10-CM | POA: Insufficient documentation

## 2022-08-02 DIAGNOSIS — Q231 Congenital insufficiency of aortic valve: Secondary | ICD-10-CM | POA: Diagnosis not present

## 2022-08-02 LAB — URINALYSIS, ROUTINE W REFLEX MICROSCOPIC
Bilirubin Urine: NEGATIVE
Glucose, UA: NEGATIVE mg/dL
Hgb urine dipstick: NEGATIVE
Ketones, ur: NEGATIVE mg/dL
Leukocytes,Ua: NEGATIVE
Nitrite: NEGATIVE
Protein, ur: NEGATIVE mg/dL
Specific Gravity, Urine: 1.006 (ref 1.005–1.030)
pH: 5 (ref 5.0–8.0)

## 2022-08-02 LAB — COMPREHENSIVE METABOLIC PANEL
ALT: 28 U/L (ref 0–44)
AST: 29 U/L (ref 15–41)
Albumin: 4.4 g/dL (ref 3.5–5.0)
Alkaline Phosphatase: 51 U/L (ref 38–126)
Anion gap: 11 (ref 5–15)
BUN: 22 mg/dL (ref 8–23)
CO2: 23 mmol/L (ref 22–32)
Calcium: 9.7 mg/dL (ref 8.9–10.3)
Chloride: 103 mmol/L (ref 98–111)
Creatinine, Ser: 1.12 mg/dL (ref 0.61–1.24)
GFR, Estimated: >60 mL/min (ref >60)
Glucose, Bld: 108 mg/dL — ABNORMAL HIGH (ref 70–99)
Potassium: 4.2 mmol/L (ref 3.5–5.1)
Sodium: 137 mmol/L (ref 135–145)
Total Bilirubin: 1.4 mg/dL — ABNORMAL HIGH (ref 0.3–1.2)
Total Protein: 6.6 g/dL (ref 6.5–8.1)

## 2022-08-02 LAB — SURGICAL PCR SCREEN
MRSA, PCR: NEGATIVE
Staphylococcus aureus: NEGATIVE

## 2022-08-02 LAB — CBC
HCT: 39.2 % (ref 39.0–52.0)
Hemoglobin: 13.4 g/dL (ref 13.0–17.0)
MCH: 33.1 pg (ref 26.0–34.0)
MCHC: 34.2 g/dL (ref 30.0–36.0)
MCV: 96.8 fL (ref 80.0–100.0)
Platelets: 217 10*3/uL (ref 150–400)
RBC: 4.05 MIL/uL — ABNORMAL LOW (ref 4.22–5.81)
RDW: 13.1 % (ref 11.5–15.5)
WBC: 5.9 10*3/uL (ref 4.0–10.5)
nRBC: 0 % (ref 0.0–0.2)

## 2022-08-02 LAB — BLOOD GAS, ARTERIAL
Acid-Base Excess: 4.1 mmol/L — ABNORMAL HIGH (ref 0.0–2.0)
Bicarbonate: 28.5 mmol/L — ABNORMAL HIGH (ref 20.0–28.0)
Drawn by: 58793
O2 Saturation: 98.4 %
Patient temperature: 37
pCO2 arterial: 41 mmHg (ref 32–48)
pH, Arterial: 7.45 (ref 7.35–7.45)
pO2, Arterial: 113 mmHg — ABNORMAL HIGH (ref 83–108)

## 2022-08-02 LAB — HEMOGLOBIN A1C
Hgb A1c MFr Bld: 5.9 % — ABNORMAL HIGH (ref 4.8–5.6)
Mean Plasma Glucose: 122.63 mg/dL

## 2022-08-02 LAB — PROTIME-INR
INR: 1 (ref 0.8–1.2)
Prothrombin Time: 12.8 seconds (ref 11.4–15.2)

## 2022-08-02 LAB — APTT: aPTT: 32 seconds (ref 24–36)

## 2022-08-02 NOTE — Progress Notes (Signed)
PCP: Dr. Bea Graff Cardiologist: Dr. Bettina Gavia  EKG: 07/14/2022 [id number] ECHO: 04-28-2022 Stress Test: denies Cardiac Cath: 07-13-2022  ERAS: NPO  Covid test: Today  STOP BANG forwarded to PCP  Patient denies fever, cough, and chest pain at PAT appointment. SOB with activity.  Patient verbalized understanding of instructions provided today at the PAT appointment.  Patient asked to review instructions at home and day of surgery.

## 2022-08-02 NOTE — Progress Notes (Signed)
   08/02/22 1056  OBSTRUCTIVE SLEEP APNEA  Have you ever been diagnosed with sleep apnea through a sleep study? No  Do you snore loudly (loud enough to be heard through closed doors)?  1  Do you often feel tired, fatigued, or sleepy during the daytime (such as falling asleep during driving or talking to someone)? 0  Has anyone observed you stop breathing during your sleep? 1  Do you have, or are you being treated for high blood pressure? 1  BMI more than 35 kg/m2? 0  Age > 50 (1-yes) 1  Neck circumference greater than:Male 16 inches or larger, Male 17inches or larger? 1 (78)  Male Gender (Yes=1) 1  Obstructive Sleep Apnea Score 6  Score 5 or greater  Results sent to PCP

## 2022-08-03 LAB — SARS CORONAVIRUS 2 (TAT 6-24 HRS): SARS Coronavirus 2: NEGATIVE

## 2022-08-03 MED ORDER — PHENYLEPHRINE HCL-NACL 20-0.9 MG/250ML-% IV SOLN
30.0000 ug/min | INTRAVENOUS | Status: AC
  Administered 2022-08-04: 20 ug/min via INTRAVENOUS
  Filled 2022-08-03: qty 250

## 2022-08-03 MED ORDER — INSULIN REGULAR(HUMAN) IN NACL 100-0.9 UT/100ML-% IV SOLN
INTRAVENOUS | Status: AC
  Administered 2022-08-04: 2 [IU]/h via INTRAVENOUS
  Filled 2022-08-03: qty 100

## 2022-08-03 MED ORDER — MANNITOL 20 % IV SOLN
INTRAVENOUS | Status: DC
  Filled 2022-08-03: qty 13

## 2022-08-03 MED ORDER — NITROGLYCERIN IN D5W 200-5 MCG/ML-% IV SOLN
0.0000 ug/min | INTRAVENOUS | Status: DC
  Filled 2022-08-03 (×2): qty 250

## 2022-08-03 MED ORDER — POTASSIUM CHLORIDE 2 MEQ/ML IV SOLN
80.0000 meq | INTRAVENOUS | Status: DC
  Filled 2022-08-03: qty 40

## 2022-08-03 MED ORDER — CEFAZOLIN SODIUM-DEXTROSE 2-4 GM/100ML-% IV SOLN
2.0000 g | INTRAVENOUS | Status: AC
  Administered 2022-08-04: 2 g via INTRAVENOUS
  Filled 2022-08-03: qty 100

## 2022-08-03 MED ORDER — PLASMA-LYTE A IV SOLN
INTRAVENOUS | Status: DC
  Filled 2022-08-03: qty 2.5

## 2022-08-03 MED ORDER — EPINEPHRINE HCL 5 MG/250ML IV SOLN IN NS
0.0000 ug/min | INTRAVENOUS | Status: DC
  Filled 2022-08-03: qty 250

## 2022-08-03 MED ORDER — TRANEXAMIC ACID 1000 MG/10ML IV SOLN
1.5000 mg/kg/h | INTRAVENOUS | Status: AC
  Administered 2022-08-04: 1.5 mg/kg/h via INTRAVENOUS
  Filled 2022-08-03: qty 25

## 2022-08-03 MED ORDER — TRANEXAMIC ACID (OHS) PUMP PRIME SOLUTION
2.0000 mg/kg | INTRAVENOUS | Status: DC
  Filled 2022-08-03: qty 2.17

## 2022-08-03 MED ORDER — MILRINONE LACTATE IN DEXTROSE 20-5 MG/100ML-% IV SOLN
0.3000 ug/kg/min | INTRAVENOUS | Status: DC
  Filled 2022-08-03: qty 100

## 2022-08-03 MED ORDER — DEXMEDETOMIDINE HCL IN NACL 400 MCG/100ML IV SOLN
0.1000 ug/kg/h | INTRAVENOUS | Status: AC
  Administered 2022-08-04: .4 ug/kg/h via INTRAVENOUS
  Filled 2022-08-03: qty 100

## 2022-08-03 MED ORDER — NOREPINEPHRINE 4 MG/250ML-% IV SOLN
0.0000 ug/min | INTRAVENOUS | Status: DC
  Filled 2022-08-03: qty 250

## 2022-08-03 MED ORDER — HEPARIN 30,000 UNITS/1000 ML (OHS) CELLSAVER SOLUTION
Status: DC
  Filled 2022-08-03: qty 1000

## 2022-08-03 MED ORDER — TRANEXAMIC ACID (OHS) BOLUS VIA INFUSION
15.0000 mg/kg | INTRAVENOUS | Status: AC
  Administered 2022-08-04: 1626 mg via INTRAVENOUS
  Filled 2022-08-03: qty 1626

## 2022-08-03 MED ORDER — VANCOMYCIN HCL 1500 MG/300ML IV SOLN
1500.0000 mg | INTRAVENOUS | Status: AC
  Administered 2022-08-04: 1500 mg via INTRAVENOUS
  Filled 2022-08-03: qty 300

## 2022-08-03 NOTE — Anesthesia Preprocedure Evaluation (Addendum)
Anesthesia Evaluation  Patient identified by MRN, date of birth, ID band Patient awake    Reviewed: Allergy & Precautions, NPO status , Patient's Chart, lab work & pertinent test results  Airway Mallampati: II  TM Distance: >3 FB Neck ROM: Full    Dental no notable dental hx.    Pulmonary Patient abstained from smoking., former smoker   Pulmonary exam normal        Cardiovascular hypertension, Pt. on medications and Pt. on home beta blockers + CAD  + Valvular Problems/Murmurs AS  Rhythm:Regular Rate:Normal + Systolic murmurs ECHO 10/24:  1. GLS -14.3. Left ventricular ejection fraction, by estimation, is 50 to  55%. The left ventricle has low normal function. The left ventricle has no  regional wall motion abnormalities. There is moderate left ventricular  hypertrophy. Left ventricular  diastolic parameters are consistent with Grade I diastolic dysfunction  (impaired relaxation).   2. Right ventricular systolic function is normal. The right ventricular  size is normal.   3. Left atrial size was moderately dilated.   4. The mitral valve is normal in structure. Mild mitral valve  regurgitation. No evidence of mitral stenosis.   5. DI 0.26, AVA 1.1 ( Large LVOT diameter - 25 mm), gradient indicate  severe AS. The aortic valve is bicuspid. There is mild calcification of  the aortic valve. There is moderate thickening of the aortic valve. Aortic  valve regurgitation is mild.  Moderate to severe aortic valve stenosis. Aortic valve mean gradient  measures 47.2 mmHg.   6. There is mild dilatation of the ascending aorta, measuring 39 mm.   7. The inferior vena cava is normal in size with greater than 50%  respiratory variability, suggesting right atrial pressure of 3 mmHg.    Cath 1/24:   Mid LM lesion is 20% stenosed. 1.  Minimal obstructive coronary artery disease. 2.  Fick cardiac output of 8.5 L/min and Fick cardiac index of  3.7 L/min/m with mean RA pressure of 9 mmHg, mean wedge pressure of 15 mmHg, and mean PA pressure of 23 mmHg.      Neuro/Psych   Anxiety Depression    negative neurological ROS     GI/Hepatic negative GI ROS, Neg liver ROS,,,  Endo/Other  negative endocrine ROS    Renal/GU negative Renal ROS     Musculoskeletal negative musculoskeletal ROS (+)    Abdominal Normal abdominal exam  (+)   Peds  Hematology negative hematology ROS (+)   Anesthesia Other Findings   Reproductive/Obstetrics                             Anesthesia Physical Anesthesia Plan  ASA: 4  Anesthesia Plan: General   Post-op Pain Management:    Induction: Intravenous  PONV Risk Score and Plan: 2 and Midazolam and Treatment may vary due to age or medical condition  Airway Management Planned: Mask and Oral ETT  Additional Equipment: Arterial line, CVP, PA Cath, TEE, 3D TEE and Ultrasound Guidance Line Placement  Intra-op Plan:   Post-operative Plan: Post-operative intubation/ventilation  Informed Consent: I have reviewed the patients History and Physical, chart, labs and discussed the procedure including the risks, benefits and alternatives for the proposed anesthesia with the patient or authorized representative who has indicated his/her understanding and acceptance.     Dental advisory given  Plan Discussed with: CRNA  Anesthesia Plan Comments: (Lab Results      Component  Value               Date                      WBC                      5.9                 08/02/2022                HGB                      13.4                08/02/2022                HCT                      39.2                08/02/2022                MCV                      96.8                08/02/2022                PLT                      217                 08/02/2022             Lab Results      Component                Value               Date                       NA                       137                 08/02/2022                K                        4.2                 08/02/2022                CO2                      23                  08/02/2022                GLUCOSE                  108 (H)             08/02/2022                BUN  22                  08/02/2022                CREATININE               1.12                08/02/2022                CALCIUM                  9.7                 08/02/2022                EGFR                     90                  06/27/2022                GFRNONAA                 >60                 08/02/2022           )       Anesthesia Quick Evaluation

## 2022-08-04 ENCOUNTER — Encounter (HOSPITAL_COMMUNITY): Payer: Self-pay | Admitting: Surgery

## 2022-08-04 ENCOUNTER — Encounter (HOSPITAL_COMMUNITY)
Admission: RE | Disposition: A | Discharge status: Home / Self Care | Payer: Self-pay | Source: Home / Self Care | Attending: Surgery

## 2022-08-04 ENCOUNTER — Inpatient Hospital Stay (HOSPITAL_COMMUNITY): Payer: 59 | Admitting: Physician Assistant

## 2022-08-04 ENCOUNTER — Other Ambulatory Visit: Payer: Self-pay

## 2022-08-04 ENCOUNTER — Inpatient Hospital Stay (HOSPITAL_COMMUNITY): Payer: 59

## 2022-08-04 ENCOUNTER — Inpatient Hospital Stay (HOSPITAL_COMMUNITY)
Admission: RE | Admit: 2022-08-04 | Discharge: 2022-08-10 | DRG: 220 | Disposition: A | Discharge status: Home / Self Care | Payer: 59 | Attending: Surgery | Admitting: Surgery

## 2022-08-04 DIAGNOSIS — Z809 Family history of malignant neoplasm, unspecified: Secondary | ICD-10-CM

## 2022-08-04 DIAGNOSIS — I2510 Atherosclerotic heart disease of native coronary artery without angina pectoris: Secondary | ICD-10-CM

## 2022-08-04 DIAGNOSIS — Z825 Family history of asthma and other chronic lower respiratory diseases: Secondary | ICD-10-CM | POA: Diagnosis not present

## 2022-08-04 DIAGNOSIS — Z87891 Personal history of nicotine dependence: Secondary | ICD-10-CM

## 2022-08-04 DIAGNOSIS — M545 Low back pain, unspecified: Secondary | ICD-10-CM | POA: Diagnosis present

## 2022-08-04 DIAGNOSIS — Z1152 Encounter for screening for COVID-19: Secondary | ICD-10-CM

## 2022-08-04 DIAGNOSIS — D62 Acute posthemorrhagic anemia: Secondary | ICD-10-CM | POA: Diagnosis not present

## 2022-08-04 DIAGNOSIS — I080 Rheumatic disorders of both mitral and aortic valves: Secondary | ICD-10-CM

## 2022-08-04 DIAGNOSIS — E782 Mixed hyperlipidemia: Secondary | ICD-10-CM | POA: Diagnosis present

## 2022-08-04 DIAGNOSIS — Z6831 Body mass index (BMI) 31.0-31.9, adult: Secondary | ICD-10-CM

## 2022-08-04 DIAGNOSIS — E669 Obesity, unspecified: Secondary | ICD-10-CM | POA: Diagnosis present

## 2022-08-04 DIAGNOSIS — I5032 Chronic diastolic (congestive) heart failure: Secondary | ICD-10-CM | POA: Diagnosis present

## 2022-08-04 DIAGNOSIS — R110 Nausea: Secondary | ICD-10-CM | POA: Diagnosis not present

## 2022-08-04 DIAGNOSIS — F32A Depression, unspecified: Secondary | ICD-10-CM | POA: Diagnosis present

## 2022-08-04 DIAGNOSIS — I471 Supraventricular tachycardia, unspecified: Secondary | ICD-10-CM | POA: Diagnosis not present

## 2022-08-04 DIAGNOSIS — F418 Other specified anxiety disorders: Secondary | ICD-10-CM

## 2022-08-04 DIAGNOSIS — R7303 Prediabetes: Secondary | ICD-10-CM | POA: Diagnosis present

## 2022-08-04 DIAGNOSIS — G2581 Restless legs syndrome: Secondary | ICD-10-CM | POA: Diagnosis present

## 2022-08-04 DIAGNOSIS — Z8616 Personal history of COVID-19: Secondary | ICD-10-CM | POA: Diagnosis not present

## 2022-08-04 DIAGNOSIS — I10 Essential (primary) hypertension: Secondary | ICD-10-CM | POA: Diagnosis not present

## 2022-08-04 DIAGNOSIS — I350 Nonrheumatic aortic (valve) stenosis: Secondary | ICD-10-CM

## 2022-08-04 DIAGNOSIS — Z8249 Family history of ischemic heart disease and other diseases of the circulatory system: Secondary | ICD-10-CM

## 2022-08-04 DIAGNOSIS — F419 Anxiety disorder, unspecified: Secondary | ICD-10-CM | POA: Diagnosis present

## 2022-08-04 DIAGNOSIS — I110 Hypertensive heart disease with heart failure: Secondary | ICD-10-CM | POA: Diagnosis present

## 2022-08-04 DIAGNOSIS — Z833 Family history of diabetes mellitus: Secondary | ICD-10-CM | POA: Diagnosis not present

## 2022-08-04 DIAGNOSIS — Z952 Presence of prosthetic heart valve: Secondary | ICD-10-CM

## 2022-08-04 DIAGNOSIS — Z7982 Long term (current) use of aspirin: Secondary | ICD-10-CM

## 2022-08-04 DIAGNOSIS — Q231 Congenital insufficiency of aortic valve: Principal | ICD-10-CM

## 2022-08-04 DIAGNOSIS — G8929 Other chronic pain: Secondary | ICD-10-CM | POA: Diagnosis present

## 2022-08-04 DIAGNOSIS — I454 Nonspecific intraventricular block: Secondary | ICD-10-CM | POA: Diagnosis present

## 2022-08-04 DIAGNOSIS — Z79899 Other long term (current) drug therapy: Secondary | ICD-10-CM | POA: Diagnosis not present

## 2022-08-04 HISTORY — PX: TEE WITHOUT CARDIOVERSION: SHX5443

## 2022-08-04 HISTORY — DX: Presence of prosthetic heart valve: Z95.2

## 2022-08-04 HISTORY — PX: AORTIC VALVE REPLACEMENT: SHX41

## 2022-08-04 LAB — CBC
HCT: 33.3 % — ABNORMAL LOW (ref 39.0–52.0)
HCT: 33 % — ABNORMAL LOW (ref 39.0–52.0)
Hemoglobin: 11.7 g/dL — ABNORMAL LOW (ref 13.0–17.0)
Hemoglobin: 11.9 g/dL — ABNORMAL LOW (ref 13.0–17.0)
MCH: 33.1 pg (ref 26.0–34.0)
MCH: 33.7 pg (ref 26.0–34.0)
MCHC: 35.1 g/dL (ref 30.0–36.0)
MCHC: 36.1 g/dL — ABNORMAL HIGH (ref 30.0–36.0)
MCV: 94.1 fL (ref 80.0–100.0)
MCV: 93.5 fL (ref 80.0–100.0)
Platelets: 121 10*3/uL — ABNORMAL LOW (ref 150–400)
Platelets: 119 10*3/uL — ABNORMAL LOW (ref 150–400)
RBC: 3.54 MIL/uL — ABNORMAL LOW (ref 4.22–5.81)
RBC: 3.53 MIL/uL — ABNORMAL LOW (ref 4.22–5.81)
RDW: 12.7 % (ref 11.5–15.5)
RDW: 13 % (ref 11.5–15.5)
WBC: 9.8 10*3/uL (ref 4.0–10.5)
WBC: 8.1 10*3/uL (ref 4.0–10.5)
nRBC: 0 % (ref 0.0–0.2)
nRBC: 0 % (ref 0.0–0.2)

## 2022-08-04 LAB — POCT I-STAT, CHEM 8
BUN: 25 mg/dL — ABNORMAL HIGH (ref 8–23)
BUN: 26 mg/dL — ABNORMAL HIGH (ref 8–23)
BUN: 26 mg/dL — ABNORMAL HIGH (ref 8–23)
BUN: 26 mg/dL — ABNORMAL HIGH (ref 8–23)
BUN: 27 mg/dL — ABNORMAL HIGH (ref 8–23)
Calcium, Ion: 1.29 mmol/L (ref 1.15–1.40)
Calcium, Ion: 1.24 mmol/L (ref 1.15–1.40)
Calcium, Ion: 1.13 mmol/L — ABNORMAL LOW (ref 1.15–1.40)
Calcium, Ion: 1.13 mmol/L — ABNORMAL LOW (ref 1.15–1.40)
Calcium, Ion: 1.14 mmol/L — ABNORMAL LOW (ref 1.15–1.40)
Chloride: 101 mmol/L (ref 98–111)
Chloride: 102 mmol/L (ref 98–111)
Chloride: 102 mmol/L (ref 98–111)
Chloride: 103 mmol/L (ref 98–111)
Chloride: 103 mmol/L (ref 98–111)
Creatinine, Ser: 0.8 mg/dL (ref 0.61–1.24)
Creatinine, Ser: 0.9 mg/dL (ref 0.61–1.24)
Creatinine, Ser: 0.9 mg/dL (ref 0.61–1.24)
Creatinine, Ser: 0.8 mg/dL (ref 0.61–1.24)
Creatinine, Ser: 0.8 mg/dL (ref 0.61–1.24)
Glucose, Bld: 149 mg/dL — ABNORMAL HIGH (ref 70–99)
Glucose, Bld: 120 mg/dL — ABNORMAL HIGH (ref 70–99)
Glucose, Bld: 151 mg/dL — ABNORMAL HIGH (ref 70–99)
Glucose, Bld: 192 mg/dL — ABNORMAL HIGH (ref 70–99)
Glucose, Bld: 211 mg/dL — ABNORMAL HIGH (ref 70–99)
HCT: 32 % — ABNORMAL LOW (ref 39.0–52.0)
HCT: 31 % — ABNORMAL LOW (ref 39.0–52.0)
HCT: 27 % — ABNORMAL LOW (ref 39.0–52.0)
HCT: 27 % — ABNORMAL LOW (ref 39.0–52.0)
HCT: 26 % — ABNORMAL LOW (ref 39.0–52.0)
Hemoglobin: 10.9 g/dL — ABNORMAL LOW (ref 13.0–17.0)
Hemoglobin: 10.5 g/dL — ABNORMAL LOW (ref 13.0–17.0)
Hemoglobin: 9.2 g/dL — ABNORMAL LOW (ref 13.0–17.0)
Hemoglobin: 9.2 g/dL — ABNORMAL LOW (ref 13.0–17.0)
Hemoglobin: 8.8 g/dL — ABNORMAL LOW (ref 13.0–17.0)
Potassium: 4.2 mmol/L (ref 3.5–5.1)
Potassium: 4.3 mmol/L (ref 3.5–5.1)
Potassium: 5 mmol/L (ref 3.5–5.1)
Potassium: 5.5 mmol/L — ABNORMAL HIGH (ref 3.5–5.1)
Potassium: 5 mmol/L (ref 3.5–5.1)
Sodium: 138 mmol/L (ref 135–145)
Sodium: 138 mmol/L (ref 135–145)
Sodium: 136 mmol/L (ref 135–145)
Sodium: 136 mmol/L (ref 135–145)
Sodium: 136 mmol/L (ref 135–145)
TCO2: 27 mmol/L (ref 22–32)
TCO2: 26 mmol/L (ref 22–32)
TCO2: 27 mmol/L (ref 22–32)
TCO2: 25 mmol/L (ref 22–32)
TCO2: 25 mmol/L (ref 22–32)

## 2022-08-04 LAB — ECHO INTRAOPERATIVE TEE
AR max vel: 1.28 cm2
AV Area VTI: 1.48 cm2
AV Area mean vel: 1.29 cm2
AV Mean grad: 33.5 mmHg
AV Peak grad: 60.8 mmHg
AV Vena cont: 0.43 cm
Ao pk vel: 3.9 m/s
Area-P 1/2: 3.33 cm2
Height: 72 in
MV M vel: 7.18 m/s
MV Peak grad: 206.2 mmHg
MV VTI: 7.44 cm2
P 1/2 time: 485 msec
Radius: 0.6 cm
Weight: 3760 oz

## 2022-08-04 LAB — POCT I-STAT EG7
Acid-Base Excess: 2 mmol/L (ref 0.0–2.0)
Bicarbonate: 29.2 mmol/L — ABNORMAL HIGH (ref 20.0–28.0)
Calcium, Ion: 1.15 mmol/L (ref 1.15–1.40)
HCT: 27 % — ABNORMAL LOW (ref 39.0–52.0)
Hemoglobin: 9.2 g/dL — ABNORMAL LOW (ref 13.0–17.0)
O2 Saturation: 65 %
Potassium: 4.4 mmol/L (ref 3.5–5.1)
Sodium: 138 mmol/L (ref 135–145)
TCO2: 31 mmol/L (ref 22–32)
pCO2, Ven: 61.2 mmHg — ABNORMAL HIGH (ref 44–60)
pH, Ven: 7.287 (ref 7.25–7.43)
pO2, Ven: 39 mmHg (ref 32–45)

## 2022-08-04 LAB — POCT I-STAT 7, (LYTES, BLD GAS, ICA,H+H)
Acid-Base Excess: 1 mmol/L (ref 0.0–2.0)
Acid-base deficit: 3 mmol/L — ABNORMAL HIGH (ref 0.0–2.0)
Acid-base deficit: 2 mmol/L (ref 0.0–2.0)
Acid-base deficit: 1 mmol/L (ref 0.0–2.0)
Acid-base deficit: 2 mmol/L (ref 0.0–2.0)
Bicarbonate: 26.7 mmol/L (ref 20.0–28.0)
Bicarbonate: 23.5 mmol/L (ref 20.0–28.0)
Bicarbonate: 24 mmol/L (ref 20.0–28.0)
Bicarbonate: 24.2 mmol/L (ref 20.0–28.0)
Bicarbonate: 23.1 mmol/L (ref 20.0–28.0)
Calcium, Ion: 1.08 mmol/L — ABNORMAL LOW (ref 1.15–1.40)
Calcium, Ion: 1.18 mmol/L (ref 1.15–1.40)
Calcium, Ion: 1.15 mmol/L (ref 1.15–1.40)
Calcium, Ion: 1.17 mmol/L (ref 1.15–1.40)
Calcium, Ion: 1.16 mmol/L (ref 1.15–1.40)
HCT: 26 % — ABNORMAL LOW (ref 39.0–52.0)
HCT: 31 % — ABNORMAL LOW (ref 39.0–52.0)
HCT: 30 % — ABNORMAL LOW (ref 39.0–52.0)
HCT: 32 % — ABNORMAL LOW (ref 39.0–52.0)
HCT: 33 % — ABNORMAL LOW (ref 39.0–52.0)
Hemoglobin: 8.8 g/dL — ABNORMAL LOW (ref 13.0–17.0)
Hemoglobin: 10.5 g/dL — ABNORMAL LOW (ref 13.0–17.0)
Hemoglobin: 10.2 g/dL — ABNORMAL LOW (ref 13.0–17.0)
Hemoglobin: 10.9 g/dL — ABNORMAL LOW (ref 13.0–17.0)
Hemoglobin: 11.2 g/dL — ABNORMAL LOW (ref 13.0–17.0)
O2 Saturation: 100 %
O2 Saturation: 96 %
O2 Saturation: 99 %
O2 Saturation: 99 %
O2 Saturation: 97 %
Patient temperature: 36.9
Patient temperature: 37
Patient temperature: 37.2
Patient temperature: 37.3
Potassium: 4.4 mmol/L (ref 3.5–5.1)
Potassium: 4.4 mmol/L (ref 3.5–5.1)
Potassium: 4.4 mmol/L (ref 3.5–5.1)
Potassium: 4.6 mmol/L (ref 3.5–5.1)
Potassium: 4.3 mmol/L (ref 3.5–5.1)
Sodium: 137 mmol/L (ref 135–145)
Sodium: 138 mmol/L (ref 135–145)
Sodium: 139 mmol/L (ref 135–145)
Sodium: 139 mmol/L (ref 135–145)
Sodium: 138 mmol/L (ref 135–145)
TCO2: 28 mmol/L (ref 22–32)
TCO2: 25 mmol/L (ref 22–32)
TCO2: 25 mmol/L (ref 22–32)
TCO2: 25 mmol/L (ref 22–32)
TCO2: 24 mmol/L (ref 22–32)
pCO2 arterial: 46.7 mmHg (ref 32–48)
pCO2 arterial: 49.7 mmHg — ABNORMAL HIGH (ref 32–48)
pCO2 arterial: 43.2 mmHg (ref 32–48)
pCO2 arterial: 43.1 mmHg (ref 32–48)
pCO2 arterial: 40.2 mmHg (ref 32–48)
pH, Arterial: 7.366 (ref 7.35–7.45)
pH, Arterial: 7.282 — ABNORMAL LOW (ref 7.35–7.45)
pH, Arterial: 7.353 (ref 7.35–7.45)
pH, Arterial: 7.358 (ref 7.35–7.45)
pH, Arterial: 7.368 (ref 7.35–7.45)
pO2, Arterial: 293 mmHg — ABNORMAL HIGH (ref 83–108)
pO2, Arterial: 92 mmHg (ref 83–108)
pO2, Arterial: 137 mmHg — ABNORMAL HIGH (ref 83–108)
pO2, Arterial: 125 mmHg — ABNORMAL HIGH (ref 83–108)
pO2, Arterial: 93 mmHg (ref 83–108)

## 2022-08-04 LAB — GLUCOSE, CAPILLARY
Glucose-Capillary: 123 mg/dL — ABNORMAL HIGH (ref 70–99)
Glucose-Capillary: 132 mg/dL — ABNORMAL HIGH (ref 70–99)
Glucose-Capillary: 110 mg/dL — ABNORMAL HIGH (ref 70–99)
Glucose-Capillary: 116 mg/dL — ABNORMAL HIGH (ref 70–99)
Glucose-Capillary: 117 mg/dL — ABNORMAL HIGH (ref 70–99)
Glucose-Capillary: 125 mg/dL — ABNORMAL HIGH (ref 70–99)
Glucose-Capillary: 157 mg/dL — ABNORMAL HIGH (ref 70–99)

## 2022-08-04 LAB — BASIC METABOLIC PANEL
Anion gap: 6 (ref 5–15)
BUN: 23 mg/dL (ref 8–23)
CO2: 24 mmol/L (ref 22–32)
Calcium: 7.9 mg/dL — ABNORMAL LOW (ref 8.9–10.3)
Chloride: 107 mmol/L (ref 98–111)
Creatinine, Ser: 1 mg/dL (ref 0.61–1.24)
GFR, Estimated: >60 mL/min (ref >60)
Glucose, Bld: 120 mg/dL — ABNORMAL HIGH (ref 70–99)
Potassium: 4.1 mmol/L (ref 3.5–5.1)
Sodium: 137 mmol/L (ref 135–145)

## 2022-08-04 LAB — PROTIME-INR
INR: 1.3 — ABNORMAL HIGH (ref 0.8–1.2)
Prothrombin Time: 16.3 seconds — ABNORMAL HIGH (ref 11.4–15.2)

## 2022-08-04 LAB — PLATELET COUNT: Platelets: 173 10*3/uL (ref 150–400)

## 2022-08-04 LAB — HEMOGLOBIN AND HEMATOCRIT, BLOOD
HCT: 28.4 % — ABNORMAL LOW (ref 39.0–52.0)
Hemoglobin: 9.7 g/dL — ABNORMAL LOW (ref 13.0–17.0)

## 2022-08-04 LAB — PREPARE RBC (CROSSMATCH)

## 2022-08-04 LAB — APTT: aPTT: 33 seconds (ref 24–36)

## 2022-08-04 LAB — ABO/RH: ABO/RH(D): O POS

## 2022-08-04 LAB — MAGNESIUM: Magnesium: 3.3 mg/dL — ABNORMAL HIGH (ref 1.7–2.4)

## 2022-08-04 SURGERY — REPLACEMENT, AORTIC VALVE, OPEN
Anesthesia: General | Site: Chest

## 2022-08-04 MED ORDER — FENTANYL CITRATE (PF) 250 MCG/5ML IJ SOLN
INTRAMUSCULAR | Status: AC
  Filled 2022-08-04: qty 5

## 2022-08-04 MED ORDER — PLASMA-LYTE A IV SOLN
INTRAVENOUS | Status: DC | PRN
  Administered 2022-08-04: 500 mL

## 2022-08-04 MED ORDER — LACTATED RINGERS IV SOLN: INTRAVENOUS | Status: DC

## 2022-08-04 MED ORDER — PROPOFOL 10 MG/ML IV BOLUS
INTRAVENOUS | Status: AC
  Filled 2022-08-04: qty 20

## 2022-08-04 MED ORDER — DEXMEDETOMIDINE HCL IN NACL 400 MCG/100ML IV SOLN
0.0000 ug/kg/h | INTRAVENOUS | Status: DC
  Administered 2022-08-04: 0.3 ug/kg/h via INTRAVENOUS

## 2022-08-04 MED ORDER — HEPARIN SODIUM (PORCINE) 1000 UNIT/ML IJ SOLN
INTRAMUSCULAR | Status: AC
  Filled 2022-08-04: qty 10

## 2022-08-04 MED ORDER — MIDAZOLAM HCL 2 MG/2ML IJ SOLN
2.0000 mg | INTRAMUSCULAR | Status: DC | PRN
  Filled 2022-08-04: qty 2

## 2022-08-04 MED ORDER — OXYCODONE HCL 5 MG PO TABS
5.0000 mg | ORAL_TABLET | ORAL | Status: DC | PRN
  Administered 2022-08-04: 10 mg via ORAL
  Administered 2022-08-05: 5 mg via ORAL
  Administered 2022-08-05 (×5): 10 mg via ORAL
  Administered 2022-08-06 – 2022-08-09 (×4): 5 mg via ORAL
  Administered 2022-08-09: 10 mg via ORAL
  Filled 2022-08-04: qty 2
  Filled 2022-08-04 (×2): qty 1
  Filled 2022-08-04: qty 2
  Filled 2022-08-04 (×2): qty 1
  Filled 2022-08-04: qty 2
  Filled 2022-08-04 (×3): qty 1
  Filled 2022-08-04 (×3): qty 2

## 2022-08-04 MED ORDER — SODIUM BICARBONATE 8.4 % IV SOLN
50.0000 meq | Freq: Once | INTRAVENOUS | Status: AC
  Administered 2022-08-04: 50 meq via INTRAVENOUS

## 2022-08-04 MED ORDER — PROTAMINE SULFATE 10 MG/ML IV SOLN
INTRAVENOUS | Status: AC
  Filled 2022-08-04: qty 10

## 2022-08-04 MED ORDER — SODIUM CHLORIDE 0.45 % IV SOLN: INTRAVENOUS | Status: DC | PRN

## 2022-08-04 MED ORDER — ACETAMINOPHEN 650 MG RE SUPP
650.0000 mg | Freq: Once | RECTAL | Status: AC
  Administered 2022-08-04: 650 mg via RECTAL

## 2022-08-04 MED ORDER — HEPARIN SODIUM (PORCINE) 1000 UNIT/ML IJ SOLN
INTRAMUSCULAR | Status: DC | PRN
  Administered 2022-08-04: 35000 [IU] via INTRAVENOUS

## 2022-08-04 MED ORDER — SODIUM CHLORIDE 0.9% FLUSH
3.0000 mL | Freq: Two times a day (BID) | INTRAVENOUS | Status: DC
  Administered 2022-08-05 – 2022-08-10 (×11): 3 mL via INTRAVENOUS

## 2022-08-04 MED ORDER — THROMBIN (RECOMBINANT) 20000 UNITS EX SOLR
CUTANEOUS | Status: AC
  Filled 2022-08-04: qty 20000

## 2022-08-04 MED ORDER — ASPIRIN 81 MG PO CHEW: 324.0000 mg | CHEWABLE_TABLET | Freq: Every day | ORAL | Status: DC

## 2022-08-04 MED ORDER — DEXTROSE 50 % IV SOLN: 0.0000 mL | INTRAVENOUS | Status: DC | PRN

## 2022-08-04 MED ORDER — MIDAZOLAM HCL (PF) 10 MG/2ML IJ SOLN
INTRAMUSCULAR | Status: AC
  Filled 2022-08-04: qty 2

## 2022-08-04 MED ORDER — PANTOPRAZOLE SODIUM 40 MG PO TBEC
40.0000 mg | DELAYED_RELEASE_TABLET | Freq: Every day | ORAL | Status: DC
  Administered 2022-08-06 – 2022-08-10 (×5): 40 mg via ORAL
  Filled 2022-08-04 (×5): qty 1

## 2022-08-04 MED ORDER — LACTATED RINGERS IV SOLN: INTRAVENOUS | Status: DC | PRN

## 2022-08-04 MED ORDER — CHLORHEXIDINE GLUCONATE CLOTH 2 % EX PADS
6.0000 | MEDICATED_PAD | Freq: Every day | CUTANEOUS | Status: DC
  Administered 2022-08-05 – 2022-08-10 (×6): 6 via TOPICAL

## 2022-08-04 MED ORDER — ARTIFICIAL TEARS OPHTHALMIC OINT
TOPICAL_OINTMENT | OPHTHALMIC | Status: DC | PRN
  Administered 2022-08-04: 1 via OPHTHALMIC

## 2022-08-04 MED ORDER — MAGNESIUM SULFATE 4 GM/100ML IV SOLN
4.0000 g | Freq: Once | INTRAVENOUS | Status: AC
  Administered 2022-08-04: 4 g via INTRAVENOUS
  Filled 2022-08-04: qty 100

## 2022-08-04 MED ORDER — PHENYLEPHRINE 80 MCG/ML (10ML) SYRINGE FOR IV PUSH (FOR BLOOD PRESSURE SUPPORT)
PREFILLED_SYRINGE | INTRAVENOUS | Status: AC
  Filled 2022-08-04: qty 20

## 2022-08-04 MED ORDER — ALBUMIN HUMAN 5 % IV SOLN: INTRAVENOUS | Status: DC | PRN

## 2022-08-04 MED ORDER — CHLORHEXIDINE GLUCONATE 0.12 % MT SOLN
15.0000 mL | Freq: Once | OROMUCOSAL | Status: AC
  Administered 2022-08-04: 15 mL via OROMUCOSAL
  Filled 2022-08-04: qty 15

## 2022-08-04 MED ORDER — NITROGLYCERIN IN D5W 200-5 MCG/ML-% IV SOLN: 0.0000 ug/min | INTRAVENOUS | Status: DC

## 2022-08-04 MED ORDER — ~~LOC~~ CARDIAC SURGERY, PATIENT & FAMILY EDUCATION
Freq: Once | Status: DC
  Filled 2022-08-04: qty 1

## 2022-08-04 MED ORDER — ROCURONIUM BROMIDE 10 MG/ML (PF) SYRINGE
PREFILLED_SYRINGE | INTRAVENOUS | Status: DC | PRN
  Administered 2022-08-04: 100 mg via INTRAVENOUS
  Administered 2022-08-04 (×4): 50 mg via INTRAVENOUS

## 2022-08-04 MED ORDER — ACETAMINOPHEN 160 MG/5ML PO SOLN: 1000.0000 mg | Freq: Four times a day (QID) | ORAL | Status: AC

## 2022-08-04 MED ORDER — INSULIN REGULAR(HUMAN) IN NACL 100-0.9 UT/100ML-% IV SOLN: INTRAVENOUS | Status: DC

## 2022-08-04 MED ORDER — FENTANYL CITRATE (PF) 250 MCG/5ML IJ SOLN
INTRAMUSCULAR | Status: DC | PRN
  Administered 2022-08-04 (×2): 100 ug via INTRAVENOUS
  Administered 2022-08-04 (×2): 50 ug via INTRAVENOUS
  Administered 2022-08-04: 100 ug via INTRAVENOUS
  Administered 2022-08-04: 50 ug via INTRAVENOUS
  Administered 2022-08-04: 100 ug via INTRAVENOUS
  Administered 2022-08-04: 50 ug via INTRAVENOUS
  Administered 2022-08-04 (×3): 100 ug via INTRAVENOUS

## 2022-08-04 MED ORDER — HEMOSTATIC AGENTS (NO CHARGE) OPTIME
TOPICAL | Status: DC | PRN
  Administered 2022-08-04 (×4): 1 via TOPICAL

## 2022-08-04 MED ORDER — HEPARIN SODIUM (PORCINE) 1000 UNIT/ML IJ SOLN
INTRAMUSCULAR | Status: AC
  Filled 2022-08-04: qty 1

## 2022-08-04 MED ORDER — METOPROLOL TARTRATE 12.5 MG HALF TABLET
12.5000 mg | ORAL_TABLET | Freq: Two times a day (BID) | ORAL | Status: DC
  Administered 2022-08-05 – 2022-08-06 (×4): 12.5 mg via ORAL
  Filled 2022-08-04 (×4): qty 1

## 2022-08-04 MED ORDER — TRAMADOL HCL 50 MG PO TABS
50.0000 mg | ORAL_TABLET | ORAL | Status: DC | PRN
  Administered 2022-08-04: 50 mg via ORAL
  Administered 2022-08-04 – 2022-08-06 (×5): 100 mg via ORAL
  Filled 2022-08-04 (×4): qty 2
  Filled 2022-08-04 (×2): qty 1
  Filled 2022-08-04: qty 2

## 2022-08-04 MED ORDER — VANCOMYCIN HCL IN DEXTROSE 1-5 GM/200ML-% IV SOLN
1000.0000 mg | Freq: Once | INTRAVENOUS | Status: AC
  Administered 2022-08-04: 1000 mg via INTRAVENOUS
  Filled 2022-08-04: qty 200

## 2022-08-04 MED ORDER — METOPROLOL TARTRATE 12.5 MG HALF TABLET
12.5000 mg | ORAL_TABLET | Freq: Once | ORAL | Status: AC
  Administered 2022-08-04: 12.5 mg via ORAL
  Filled 2022-08-04: qty 1

## 2022-08-04 MED ORDER — PROPOFOL 10 MG/ML IV BOLUS
INTRAVENOUS | Status: DC | PRN
  Administered 2022-08-04: 100 mg via INTRAVENOUS

## 2022-08-04 MED ORDER — ATORVASTATIN CALCIUM 10 MG PO TABS
20.0000 mg | ORAL_TABLET | Freq: Every evening | ORAL | Status: DC
  Administered 2022-08-05 – 2022-08-09 (×5): 20 mg via ORAL
  Filled 2022-08-04 (×5): qty 2

## 2022-08-04 MED ORDER — SODIUM CHLORIDE 0.9 % IV SOLN: 250.0000 mL | INTRAVENOUS | Status: DC

## 2022-08-04 MED ORDER — PHENYLEPHRINE HCL-NACL 20-0.9 MG/250ML-% IV SOLN: 0.0000 ug/min | INTRAVENOUS | Status: DC

## 2022-08-04 MED ORDER — PROTAMINE SULFATE 10 MG/ML IV SOLN
INTRAVENOUS | Status: AC
  Filled 2022-08-04: qty 25

## 2022-08-04 MED ORDER — METOPROLOL TARTRATE 5 MG/5ML IV SOLN
2.5000 mg | INTRAVENOUS | Status: DC | PRN
  Administered 2022-08-05 – 2022-08-06 (×2): 2.5 mg via INTRAVENOUS
  Filled 2022-08-04 (×2): qty 5

## 2022-08-04 MED ORDER — BISACODYL 10 MG RE SUPP: 10.0000 mg | Freq: Every day | RECTAL | Status: DC

## 2022-08-04 MED ORDER — DEXMEDETOMIDINE HCL IN NACL 400 MCG/100ML IV SOLN
INTRAVENOUS | Status: AC
  Filled 2022-08-04: qty 100

## 2022-08-04 MED ORDER — MORPHINE SULFATE (PF) 4 MG/ML IV SOLN
INTRAVENOUS | Status: AC
  Administered 2022-08-04: 4 mg
  Filled 2022-08-04: qty 1

## 2022-08-04 MED ORDER — 0.9 % SODIUM CHLORIDE (POUR BTL) OPTIME
TOPICAL | Status: DC | PRN
  Administered 2022-08-04: 5000 mL

## 2022-08-04 MED ORDER — DOCUSATE SODIUM 100 MG PO CAPS
200.0000 mg | ORAL_CAPSULE | Freq: Every day | ORAL | Status: DC
  Administered 2022-08-05 – 2022-08-10 (×6): 200 mg via ORAL
  Filled 2022-08-04 (×6): qty 2

## 2022-08-04 MED ORDER — ROCURONIUM BROMIDE 10 MG/ML (PF) SYRINGE
PREFILLED_SYRINGE | INTRAVENOUS | Status: AC
  Filled 2022-08-04: qty 20

## 2022-08-04 MED ORDER — POTASSIUM CHLORIDE 10 MEQ/50ML IV SOLN: 10.0000 meq | INTRAVENOUS | Status: AC

## 2022-08-04 MED ORDER — PROTAMINE SULFATE 10 MG/ML IV SOLN
INTRAVENOUS | Status: DC | PRN
  Administered 2022-08-04: 40 mg via INTRAVENOUS
  Administered 2022-08-04: 290 mg via INTRAVENOUS

## 2022-08-04 MED ORDER — ALBUMIN HUMAN 5 % IV SOLN
250.0000 mL | INTRAVENOUS | Status: DC | PRN
  Administered 2022-08-04: 12.5 g via INTRAVENOUS

## 2022-08-04 MED ORDER — ASPIRIN 325 MG PO TBEC
325.0000 mg | DELAYED_RELEASE_TABLET | Freq: Every day | ORAL | Status: DC
  Administered 2022-08-05 – 2022-08-10 (×6): 325 mg via ORAL
  Filled 2022-08-04 (×6): qty 1

## 2022-08-04 MED ORDER — FAMOTIDINE IN NACL 20-0.9 MG/50ML-% IV SOLN
20.0000 mg | Freq: Two times a day (BID) | INTRAVENOUS | Status: DC
  Administered 2022-08-04: 20 mg via INTRAVENOUS
  Filled 2022-08-04: qty 50

## 2022-08-04 MED ORDER — METOPROLOL TARTRATE 25 MG/10 ML ORAL SUSPENSION: 12.5000 mg | Freq: Two times a day (BID) | ORAL | Status: DC

## 2022-08-04 MED ORDER — SODIUM CHLORIDE 0.9% FLUSH: 3.0000 mL | INTRAVENOUS | Status: DC | PRN

## 2022-08-04 MED ORDER — CHLORHEXIDINE GLUCONATE 0.12 % MT SOLN
15.0000 mL | OROMUCOSAL | Status: AC
  Administered 2022-08-04: 15 mL via OROMUCOSAL
  Filled 2022-08-04: qty 15

## 2022-08-04 MED ORDER — ACETAMINOPHEN 160 MG/5ML PO SOLN: 650.0000 mg | Freq: Once | ORAL | Status: AC

## 2022-08-04 MED ORDER — CEFAZOLIN SODIUM-DEXTROSE 2-4 GM/100ML-% IV SOLN
2.0000 g | Freq: Three times a day (TID) | INTRAVENOUS | Status: AC
  Administered 2022-08-04 – 2022-08-06 (×6): 2 g via INTRAVENOUS
  Filled 2022-08-04 (×6): qty 100

## 2022-08-04 MED ORDER — THROMBIN 20000 UNITS EX SOLR
OROMUCOSAL | Status: DC | PRN
  Administered 2022-08-04: 20 mL via TOPICAL

## 2022-08-04 MED ORDER — ACETAMINOPHEN 500 MG PO TABS
1000.0000 mg | ORAL_TABLET | Freq: Four times a day (QID) | ORAL | Status: AC
  Administered 2022-08-04 – 2022-08-09 (×19): 1000 mg via ORAL
  Filled 2022-08-04 (×19): qty 2

## 2022-08-04 MED ORDER — SODIUM CHLORIDE 0.9 % IV SOLN: INTRAVENOUS | Status: DC | PRN

## 2022-08-04 MED ORDER — EZETIMIBE 10 MG PO TABS
10.0000 mg | ORAL_TABLET | Freq: Every day | ORAL | Status: DC
  Administered 2022-08-05 – 2022-08-10 (×6): 10 mg via ORAL
  Filled 2022-08-04 (×6): qty 1

## 2022-08-04 MED ORDER — SODIUM CHLORIDE 0.9 % IV SOLN: INTRAVENOUS | Status: DC

## 2022-08-04 MED ORDER — MIDAZOLAM HCL (PF) 5 MG/ML IJ SOLN
INTRAMUSCULAR | Status: DC | PRN
  Administered 2022-08-04 (×5): 2 mg via INTRAVENOUS

## 2022-08-04 MED ORDER — BISACODYL 5 MG PO TBEC
10.0000 mg | DELAYED_RELEASE_TABLET | Freq: Every day | ORAL | Status: DC
  Administered 2022-08-05 – 2022-08-10 (×6): 10 mg via ORAL
  Filled 2022-08-04 (×6): qty 2

## 2022-08-04 MED ORDER — LACTATED RINGERS IV SOLN: 500.0000 mL | Freq: Once | INTRAVENOUS | Status: DC | PRN

## 2022-08-04 MED ORDER — ONDANSETRON HCL 4 MG/2ML IJ SOLN
4.0000 mg | Freq: Four times a day (QID) | INTRAMUSCULAR | Status: DC | PRN
  Administered 2022-08-04 – 2022-08-06 (×6): 4 mg via INTRAVENOUS
  Filled 2022-08-04 (×7): qty 2

## 2022-08-04 MED ORDER — MORPHINE SULFATE (PF) 2 MG/ML IV SOLN
1.0000 mg | INTRAVENOUS | Status: DC | PRN
  Administered 2022-08-04 (×2): 4 mg via INTRAVENOUS
  Administered 2022-08-04: 2 mg via INTRAVENOUS
  Administered 2022-08-05 (×5): 4 mg via INTRAVENOUS
  Filled 2022-08-04 (×6): qty 2
  Filled 2022-08-04: qty 1
  Filled 2022-08-04: qty 2

## 2022-08-04 MED ORDER — CHLORHEXIDINE GLUCONATE 4 % EX LIQD: 30.0000 mL | CUTANEOUS | Status: DC

## 2022-08-04 SURGICAL SUPPLY — 90 items
ADAPTER CARDIO PERF ANTE/RETRO (ADAPTER) ×2 IMPLANT
BAG DECANTER FOR FLEXI CONT (MISCELLANEOUS) ×2 IMPLANT
BLADE CLIPPER SURG (BLADE) ×2 IMPLANT
BLADE STERNUM SYSTEM 6 (BLADE) ×2 IMPLANT
BLADE SURG 15 STRL LF DISP TIS (BLADE) ×2 IMPLANT
BLADE SURG 15 STRL SS (BLADE) ×2
CANISTER SUCT 3000ML PPV (MISCELLANEOUS) ×2 IMPLANT
CANNULA AORTIC ROOT 9FR (CANNULA) IMPLANT
CANNULA ARTERIAL NVNT 3/8 22FR (MISCELLANEOUS) IMPLANT
CANNULA GUNDRY RCSP 15FR (MISCELLANEOUS) ×2 IMPLANT
CANNULA MC2 2 STG 36/46 NON-V (CANNULA) IMPLANT
CANNULA VENOUS 2 STG 34/46 (CANNULA) ×2
CATH HEART VENT LEFT (CATHETERS) ×2 IMPLANT
CATH ROBINSON RED A/P 18FR (CATHETERS) ×6 IMPLANT
CATH THORACIC 36FR (CATHETERS) ×2 IMPLANT
CATH THORACIC 36FR RT ANG (CATHETERS) ×2 IMPLANT
CNTNR URN SCR LID CUP LEK RST (MISCELLANEOUS) ×2 IMPLANT
CONT SPEC 4OZ STRL OR WHT (MISCELLANEOUS) ×2
CONTAINER PROTECT SURGISLUSH (MISCELLANEOUS) ×4 IMPLANT
COVER SURGICAL LIGHT HANDLE (MISCELLANEOUS) ×2 IMPLANT
DEVICE SUT CK QUICK LOAD INDV (Prosthesis & Implant Heart) IMPLANT
DEVICE SUT CK QUICK LOAD MINI (Prosthesis & Implant Heart) IMPLANT
DRAPE CARDIOVASCULAR INCISE (DRAPES) ×2
DRAPE SLUSH MACHINE 52X66 (DRAPES) IMPLANT
DRAPE SRG 135X102X78XABS (DRAPES) ×2 IMPLANT
DRAPE WARM FLUID 44X44 (DRAPES) ×2 IMPLANT
DRSG COVADERM 4X14 (GAUZE/BANDAGES/DRESSINGS) ×2 IMPLANT
ELECT CAUTERY BLADE 6.4 (BLADE) ×2 IMPLANT
ELECT REM PT RETURN 9FT ADLT (ELECTROSURGICAL) ×4
ELECT SOLID GEL RDN PRO-PADZ (MISCELLANEOUS) ×2
ELECTRODE REM PT RTRN 9FT ADLT (ELECTROSURGICAL) ×4 IMPLANT
ELECTRODE SOLI GEL RDN PROPADZ (MISCELLANEOUS) IMPLANT
FELT TEFLON 1X6 (MISCELLANEOUS) ×4 IMPLANT
GAUZE 4X4 16PLY ~~LOC~~+RFID DBL (SPONGE) ×2 IMPLANT
GAUZE SPONGE 4X4 12PLY STRL (GAUZE/BANDAGES/DRESSINGS) ×2 IMPLANT
GLOVE BIO SURGEON STRL SZ 6 (GLOVE) IMPLANT
GLOVE BIO SURGEON STRL SZ 6.5 (GLOVE) IMPLANT
GLOVE BIO SURGEON STRL SZ7 (GLOVE) IMPLANT
GLOVE BIO SURGEON STRL SZ7.5 (GLOVE) IMPLANT
GLOVE BIOGEL PI IND STRL 6 (GLOVE) IMPLANT
GLOVE BIOGEL PI IND STRL 7.5 (GLOVE) IMPLANT
GLOVE ECLIPSE 7.0 STRL STRAW (GLOVE) IMPLANT
GLOVE SS BIOGEL STRL SZ 6 (GLOVE) IMPLANT
GLOVE SURG MICRO LTX SZ7 (GLOVE) ×4 IMPLANT
GOWN STRL REUS W/ TWL LRG LVL3 (GOWN DISPOSABLE) ×8 IMPLANT
GOWN STRL REUS W/ TWL XL LVL3 (GOWN DISPOSABLE) ×2 IMPLANT
GOWN STRL REUS W/TWL LRG LVL3 (GOWN DISPOSABLE) ×12
GOWN STRL REUS W/TWL XL LVL3 (GOWN DISPOSABLE) ×4
HEMOSTAT POWDER SURGIFOAM 1G (HEMOSTASIS) ×6 IMPLANT
HEMOSTAT SURGICEL 2X14 (HEMOSTASIS) ×2 IMPLANT
KIT BASIN OR (CUSTOM PROCEDURE TRAY) ×2 IMPLANT
KIT CATH CPB BARTLE (MISCELLANEOUS) ×2 IMPLANT
KIT SUCTION CATH 14FR (SUCTIONS) ×2 IMPLANT
KIT SUT CK MINI COMBO 4X17 (Prosthesis & Implant Heart) IMPLANT
KIT TURNOVER KIT B (KITS) ×2 IMPLANT
NS IRRIG 1000ML POUR BTL (IV SOLUTION) ×12 IMPLANT
PACK E OPEN HEART (SUTURE) ×2 IMPLANT
PACK OPEN HEART (CUSTOM PROCEDURE TRAY) ×2 IMPLANT
PAD ARMBOARD 7.5X6 YLW CONV (MISCELLANEOUS) ×4 IMPLANT
PENCIL BUTTON HOLSTER BLD 10FT (ELECTRODE) IMPLANT
POSITIONER HEAD DONUT 9IN (MISCELLANEOUS) ×2 IMPLANT
SET MPS 3-ND DEL (MISCELLANEOUS) IMPLANT
SOL PREP POV-IOD 4OZ 10% (MISCELLANEOUS) IMPLANT
SOL SCRUB PVP POV-IOD 4OZ 7.5% (MISCELLANEOUS) ×4
SOLUTION SCRB POV-IOD 4OZ 7.5% (MISCELLANEOUS) IMPLANT
SPONGE T-LAP 18X18 ~~LOC~~+RFID (SPONGE) ×8 IMPLANT
SPONGE T-LAP 4X18 ~~LOC~~+RFID (SPONGE) ×2 IMPLANT
SUT BONE WAX W31G (SUTURE) ×2 IMPLANT
SUT EB EXC GRN/WHT 2-0 V-5 (SUTURE) ×4 IMPLANT
SUT ETHIBON EXCEL 2-0 V-5 (SUTURE) IMPLANT
SUT ETHIBOND V-5 VALVE (SUTURE) IMPLANT
SUT PROLENE 3 0 SH DA (SUTURE) IMPLANT
SUT PROLENE 3 0 SH1 36 (SUTURE) ×2 IMPLANT
SUT PROLENE 4 0 RB 1 (SUTURE) ×10
SUT PROLENE 4-0 RB1 .5 CRCL 36 (SUTURE) ×6 IMPLANT
SUT SILK 2 0 SH CR/8 (SUTURE) IMPLANT
SUT STEEL 6MS V (SUTURE) IMPLANT
SUT VIC AB 1 CTX 36 (SUTURE) ×4
SUT VIC AB 1 CTX36XBRD ANBCTR (SUTURE) ×4 IMPLANT
SYSTEM SAHARA CHEST DRAIN ATS (WOUND CARE) ×2 IMPLANT
TAPE CLOTH SURG 4X10 WHT LF (GAUZE/BANDAGES/DRESSINGS) IMPLANT
TAPE PAPER 2X10 WHT MICROPORE (GAUZE/BANDAGES/DRESSINGS) IMPLANT
TOWEL GREEN STERILE (TOWEL DISPOSABLE) ×2 IMPLANT
TOWEL GREEN STERILE FF (TOWEL DISPOSABLE) ×2 IMPLANT
TRAY FOLEY SLVR 16FR TEMP STAT (SET/KITS/TRAYS/PACK) ×2 IMPLANT
TUBE SUCT INTRACARD DLP 20F (MISCELLANEOUS) IMPLANT
UNDERPAD 30X36 HEAVY ABSORB (UNDERPADS AND DIAPERS) ×2 IMPLANT
VALVE AORTIC SZ27 INSP/RESIL (Valve) IMPLANT
VENT LEFT HEART 12002 (CATHETERS) ×2
WATER STERILE IRR 1000ML POUR (IV SOLUTION) ×4 IMPLANT

## 2022-08-04 NOTE — Anesthesia Procedure Notes (Signed)
Procedure Name: Intubation Date/Time: 08/04/2022 7:54 AM  Performed by: Thelma Comp, CRNAPre-anesthesia Checklist: Patient identified, Emergency Drugs available, Suction available and Patient being monitored Patient Re-evaluated:Patient Re-evaluated prior to induction Oxygen Delivery Method: Circle System Utilized Preoxygenation: Pre-oxygenation with 100% oxygen Induction Type: IV induction Ventilation: Mask ventilation without difficulty Laryngoscope Size: Mac and 4 Grade View: Grade I Tube type: Oral Tube size: 8.0 mm Number of attempts: 1 Airway Equipment and Method: Stylet Placement Confirmation: ETT inserted through vocal cords under direct vision, positive ETCO2 and breath sounds checked- equal and bilateral Secured at: 23 cm Tube secured with: Tape Dental Injury: Teeth and Oropharynx as per pre-operative assessment

## 2022-08-04 NOTE — Hospital Course (Signed)
PCP is Raina Mina., MD Referring Provider is Early Osmond, MD   HPI:   The patient is a 62 year old gentleman with history of hyperlipidemia, prediabetes, and bicuspid aortic valve disease that has been followed with periodic echocardiogram.  His most recent echocardiogram on 04/28/2022 showed a bicuspid aortic valve with thickening and calcification.  Mean gradient was 47.2 mmHg with a peak gradient of 68.4 mmHg.  Dimensionless index was 0.26 with an aortic valve area 1.1 cm.  There was mild aortic insufficiency.  Left ventricular ejection fraction was 50 to 55% with moderate LVH and grade 1 diastolic dysfunction.  He was seen in November 2023 with congestive heart failure with lower extremity edema.  He improved with diuretic therapy.  He did not wish to proceed with further workup for surgery at that time due to his job and financial demands.  Cardiac catheterization on 07/13/2022 showed minimal non-obstructive coronary disease.  Right heart pressures are normal.   He reports about a 6-month history of progressive exertional shortness of breath and fatigue.  He denies any chest pain or pressure.  Has had no dizziness or syncope.  He has had bilateral lower extremity edema for the past several months.  He denies orthopnea and PND.   He reports seeing his dentist recently and had an orthopantogram and dental cleaning and was told that he had no active dental disease and no contraindication to surgery.   He works for a [employer] and has a very strenuous job.  He has been there for almost 30 years. He lives in [place].  Following review of the patient and all his studies Dr. Cyndia Bent recommended aortic valve replacement and he was admitted this hospitalization electively for the procedure.  Hospital course:  Patient was brought to the operating room and underwent placement of a number 27 mm Inspiris Resilia aortic valve.  He tolerated the procedure well was  taken to the surgical intensive care unit in stable condition.

## 2022-08-04 NOTE — Progress Notes (Signed)
  Echocardiogram Echocardiogram Transesophageal has been performed.  Craig Alvarez 08/04/2022, 8:16 AM

## 2022-08-04 NOTE — Op Note (Signed)
CARDIOVASCULAR SURGERY OPERATIVE NOTE  08/04/2022 Craig Alvarez [id number]  Surgeon:  Gaye Pollack, MD  First Assistant: Delane Ginger, MD, [place] Chief surgical resident: An experienced assistant was required given the complexity of this surgery and the standard of surgical care. The assistant was needed for exposure, dissection, suctioning, retraction of delicate tissues and sutures, instrument exchange and for overall help during this procedure.    Preoperative Diagnosis:  Bicuspid aortic valve with severe aortic stenosis.   Postoperative Diagnosis:  Same   Procedure:  Median Sternotomy Extracorporeal circulation 3.   Aortic valve replacement using a 27 mm Edwards INSPIRIS RESILIA pericardial valve.  Anesthesia:  General Endotracheal   Clinical History/Surgical Indication:  This 62 year old gentleman has stage D, severe, symptomatic aortic stenosis with NYHA class III symptoms of exertional fatigue and shortness of breath consistent with chronic diastolic congestive heart failure.  I agree that aortic valve replacement is indicated in this patient for relief of his symptoms and to prevent progressive left ventricular dysfunction.  Given his relatively young age I think that open surgical aortic valve replacement is the best option for him.  His aortic root and ascending aorta appear to be of normal size on echocardiogram.  I discussed the alternatives of mechanical and bioprosthetic valves with him.  He does not want to be on Coumadin and would like to use a bioprosthetic valve.  I think that is very reasonable at his age. I discussed the operative procedure with the patient including alternatives, benefits and risks; including but not limited to bleeding, blood transfusion, infection, stroke, myocardial infarction, graft failure, heart block requiring a permanent pacemaker, organ dysfunction, and death.  Craig Alvarez understands and agrees to proceed.     Preparation:  The patient  was seen in the preoperative holding area and the correct patient, correct operation were confirmed with the patient after reviewing the medical record and catheterization. The consent was signed by me. Preoperative antibiotics were given. A pulmonary arterial line and radial arterial line were placed by the anesthesia team. The patient was taken back to the operating room and positioned supine on the operating room table. After being placed under general endotracheal anesthesia by the anesthesia team a foley catheter was placed. The neck, chest, abdomen, and both legs were prepped with betadine soap and solution and draped in the usual sterile manner. A surgical time-out was taken and the correct patient and operative procedure were confirmed with the nursing and anesthesia staff.   Pre-bypass TEE:   Complete TEE assessment was performed by Dr. Maryclare Bean, This showed a bicuspid aortic valve with severe aortic stenosis, mild to moderate AI, moderate MR, normal LV systolic function    Post-bypass TEE:   Normal functioning prosthetic aortic valve with no perivalvular leak or regurgitation through the valve. Left ventricular function preserved. Mild residual mitral regurgitation. Small ASD noted.    Cardiopulmonary Bypass:  A median sternotomy was performed. The pericardium was opened in the midline. Right ventricular function appeared normal. The ascending aorta was of normal size and had no palpable plaque. There were no contraindications to aortic cannulation or cross-clamping. The patient was fully systemically heparinized and the ACT was maintained > 400 sec. The proximal aortic arch was cannulated with a 37 F aortic cannula for arterial inflow. Venous cannulation was performed via the right atrial appendage using a two-staged venous cannula. An antegrade cardioplegia/vent cannula was inserted into the mid-ascending aorta. A left ventricular vent was placed via the right superior pulmonary  vein.  A retrograde cardioplegia cannnula was placed into the coronary sinus via the right atrium. Aortic occlusion was performed with a single cross-clamp. Systemic cooling to 32 degrees Centigrade and topical cooling of the heart with iced saline were used. Cold retrograde and antegrade KBC cardioplegia was used to induce diastolic arrest and then cold retrograde KBC cardioplegia was given at about 60 minute intervals throughout the period of arrest to maintain myocardial temperature at or below 10 degrees centigrade. A temperature probe was inserted into the interventricular septum and an insulating pad was placed in the pericardium. Carbon dioxide was insufflated into the pericardium at 5L/min throughout the procedure to minimize intracardiac air.   Aortic Valve Replacement:   A transverse aortotomy was performed 1 cm above the take-off of the right coronary artery. The native valve was a type 0 bicuspid with calcified leaflets and severe annular calcification. The ostia of the coronary arteries were located high up off the annulus and were not obstructed. The left ostium was slit shaped. The native valve leaflets were excised and the annulus was decalcified with rongeurs. It took a considerable amount of time to debride his severely calcified annulus. Care was taken to remove all particulate debris. The left ventricle was directly inspected for debris and then irrigated with ice saline solution. The annulus was sized and a size 27 mm Edwards INSPIRIS RESILIA pericardial valve was chosen. The model number was [id number] and the serial number was [id number]. While the valve was being prepared 2-0 Ethibond pledgeted horizontal mattress sutures were placed around the annulus with the pledgets in a sub-annular position. The sutures were placed through the sewing ring and the valve lowered into place. The sutures were tied sequentially. The valve seated nicely and the coronary ostia were not obstructed. The prosthetic  valve leaflets moved normally and there was no sub-valvular obstruction. The aortotomy was closed using 4-0 Prolene suture in 2 layers with felt strips to reinforce the closure.  Completion:  The patient was rewarmed to 37 degrees Centigrade. De-airing maneuvers were performed and the head placed in trendelenburg position. The crossclamp was removed with a time of 111 minutes. There was spontaneous return of sinus rhythm. The aortotomy was checked for hemostasis. Two temporary epicardial pacing wires were placed on the right atrium and two on the right ventricle. The left ventricular vent and retrograde cardioplegia cannulas were removed. The patient was weaned from CPB without difficulty on no inotropes. CPB time was 141 minutes. Cardiac output was 5 LPM. Heparin was fully reversed with protamine and the aortic and venous cannulas removed. Hemostasis was achieved. Mediastinal drainage tubes were placed. The sternum was closed with double #6 stainless steel wires. The fascia was closed with continuous # 1 vicryl suture. The subcutaneous tissue was closed with 2-0 vicryl continuous suture. The skin was closed with 3-0 vicryl subcuticular suture. All sponge, needle, and instrument counts were reported correct at the end of the case. Dry sterile dressings were placed over the incisions and around the chest tubes which were connected to pleurevac suction. The patient was then transported to the surgical intensive care unit in stable condition.

## 2022-08-04 NOTE — Interval H&P Note (Signed)
History and Physical Interval Note:  08/04/2022 6:31 AM  Craig Alvarez  has presented today for surgery, with the diagnosis of SEVERE AS.  The various methods of treatment have been discussed with the patient and family. After consideration of risks, benefits and other options for treatment, the patient has consented to  Procedure(s): AORTIC VALVE REPLACEMENT (AVR) (N/A) TRANSESOPHAGEAL ECHOCARDIOGRAM (TEE) (N/A) as a surgical intervention.  The patient's history has been reviewed, patient examined, no change in status, stable for surgery.  I have reviewed the patient's chart and labs.  Questions were answered to the patient's satisfaction.     Gaye Pollack

## 2022-08-04 NOTE — H&P (Signed)
[address]       [place] [address]             [phone]      Cardiothoracic Surgery Admission History and Physical   PCP is Gordan Payment., MD Referring Provider is Orbie Pyo, MD       Chief Complaint  Patient presents with   Aortic Stenosis           HPI:   The patient is a 62 year old gentleman with history of hyperlipidemia, prediabetes, and bicuspid aortic valve disease that has been followed with periodic echocardiogram.  His most recent echocardiogram on 04/28/2022 showed a bicuspid aortic valve with thickening and calcification.  Mean gradient was 47.2 mmHg with a peak gradient of 68.4 mmHg.  Dimensionless index was 0.26 with an aortic valve area 1.1 cm.  There was mild aortic insufficiency.  Left ventricular ejection fraction was 50 to 55% with moderate LVH and grade 1 diastolic dysfunction.  He was seen in November 2023 with congestive heart failure with lower extremity edema.  He improved with diuretic therapy.  He did not wish to proceed with further workup for surgery at that time due to his job and financial demands.  Cardiac catheterization on 07/13/2022 showed minimal non-obstructive coronary disease.  Right heart pressures are normal.   He reports about a 35-month history of progressive exertional shortness of breath and fatigue.  He denies any chest pain or pressure.  Has had no dizziness or syncope.  He has had bilateral lower extremity edema for the past several months.  He denies orthopnea and PND.   He reports seeing his dentist recently and had an orthopantogram and dental cleaning and was told that he had no active dental disease and no contraindication to surgery.   He works for a [employer] and has a very strenuous job.  He has been there for almost 30 years. He lives in [place].     Past Medical History:  Diagnosis Date   Acute respiratory disease due to COVID-19 virus 04/26/2020   Anxiety  01/21/2019   Bicuspid aortic valve     Chronic bilateral low back pain without sciatica 12/12/2017   Depression     Dyslipidemia 12/21/2015   ED (erectile dysfunction) 11/04/2015   Essential hypertension 01/21/2019   Hypercholesteremia     Insomnia 01/21/2019   Mixed hyperlipidemia 01/21/2019   Nonrheumatic aortic valve stenosis 12/21/2015   Prediabetes 11/04/2015   Restless legs syndrome (RLS) 01/21/2019   Tear of meniscus of knee 12/27/2018           Past Surgical History:  Procedure Laterality Date   KNEE ARTHROSCOPY WITH MENISCAL REPAIR Right 2020   RIGHT HEART CATH AND CORONARY ANGIOGRAPHY N/A 07/13/2022    Procedure: RIGHT HEART CATH AND CORONARY ANGIOGRAPHY;  Surgeon: Orbie Pyo, MD;  Location: [facility];  Service: Cardiovascular;  Laterality: N/A;           Family History  Problem Relation Age of Onset   COPD Mother     Heart attack Father     Diabetes Brother     Cancer Maternal Grandmother        Social History Social History         Tobacco Use   Smoking status: Former      Types: Cigarettes      Quit date: 1999      Years since quitting: 25.0  Smokeless tobacco: Never  Vaping Use   Vaping Use: Never used  Substance Use Topics   Alcohol use: Yes      Alcohol/week: 12.0 - 14.0 standard drinks of alcohol      Types: 12 - 14 Glasses of wine per week   Drug use: Yes      Types: Marijuana            Current Outpatient Medications  Medication Sig Dispense Refill   ALPRAZolam (XANAX) 1 MG tablet Take 1 mg by mouth at bedtime.       amLODipine (NORVASC) 5 MG tablet Take 5 mg by mouth in the morning.       amoxicillin (AMOXIL) 500 MG tablet Take 2,000 mg by mouth See admin instructions. Take 1 hour before dental procedures       aspirin 81 MG chewable tablet Chew 81 mg by mouth in the morning.       atorvastatin (LIPITOR) 20 MG tablet Take 20 mg by mouth every evening.       CALCIUM-VITAMIN D PO Take 1 tablet by mouth in the morning.        chlorthalidone (HYGROTON) 25 MG tablet Take 25 mg by mouth in the morning.       eszopiclone (LUNESTA) 2 MG TABS tablet Take 2 mg by mouth at bedtime as needed for sleep.       ezetimibe (ZETIA) 10 MG tablet Take 1 tablet (10 mg total) by mouth daily. 90 tablet 3   furosemide (LASIX) 20 MG tablet Take 1 tablet (20 mg total) by mouth daily. 30 tablet 1   ibuprofen (ADVIL) 200 MG tablet Take 600 mg by mouth every 8 (eight) hours as needed (pain.).       lisinopril (ZESTRIL) 20 MG tablet Take 20 mg by mouth in the morning.       Multiple Vitamin (MULTIVITAMIN) tablet Take 1 tablet by mouth in the morning.       Omega-3 1000 MG CAPS Take 1,000 mg by mouth in the morning.       rOPINIRole (REQUIP) 0.5 MG tablet Take 0.5 mg by mouth at bedtime.       sildenafil (REVATIO) 20 MG tablet Take 60-80 mg by mouth daily as needed (erectile dysfunction).        No current facility-administered medications for this visit.      No Known Allergies   Review of Systems  Constitutional:  Positive for activity change and fatigue.  HENT: Negative.    Eyes: Negative.   Respiratory:  Positive for shortness of breath.   Cardiovascular:  Positive for leg swelling. Negative for chest pain.  Gastrointestinal: Negative.   Endocrine: Negative.   Genitourinary: Negative.   Musculoskeletal: Negative.   Skin: Negative.   Neurological:  Negative for dizziness and syncope.  Hematological: Negative.   Psychiatric/Behavioral: Negative.        BP 121/78 (BP Location: Right Arm, Patient Position: Sitting, Cuff Size: Large)   Pulse 85   Resp 20   Ht 6' (1.829 m)   Wt 232 lb (105.2 kg)   SpO2 95% Comment: RA  BMI 31.46 kg/m  Physical Exam Constitutional:      Appearance: Normal appearance. He is obese.  HENT:     Head: Normocephalic and atraumatic.     Mouth/Throat:     Mouth: Mucous membranes are moist.     Pharynx: Oropharynx is clear.  Eyes:     Extraocular Movements: Extraocular movements intact.  Conjunctiva/sclera: Conjunctivae normal.     Pupils: Pupils are equal, round, and reactive to light.  Neck:     Vascular: No carotid bruit.  Cardiovascular:     Rate and Rhythm: Normal rate and regular rhythm.     Pulses: Normal pulses.     Heart sounds: Murmur heard.     Comments: 3/6 systolic murmur RSB, no diastolic murmur Pulmonary:     Effort: Pulmonary effort is normal.     Breath sounds: Normal breath sounds.  Abdominal:     Tenderness: There is no abdominal tenderness.  Musculoskeletal:     Cervical back: Normal range of motion and neck supple.     Comments: Mild edema bilaterally  Skin:    General: Skin is warm and dry.  Neurological:     General: No focal deficit present.     Mental Status: He is alert and oriented to person, place, and time.  Psychiatric:        Mood and Affect: Mood normal.        Behavior: Behavior normal.          Diagnostic Tests:   ECHOCARDIOGRAM REPORT       Patient Name:   Craig Alvarez Date of Exam: 04/28/2022  Medical Rec #:  [id number]    Height:       72.0 in  Accession #:    [id number]   Weight:       230.0 lb  Date of Birth:  04/01/1961     BSA:          2.261 m  Patient Age:    23 years     BP:           110/80 mmHg  Patient Gender: M            HR:           71 bpm.  Exam Location:  [facility]   Procedure: 2D Echo, Cardiac Doppler, Color Doppler and Strain Analysis   Indications:    Bicuspid aortic valve [Q23.1 (ICD-10-CM)]; Essential                  hypertension [I10 (ICD-10-CM)]; Mixed hyperlipidemia  [E78.2                 (ICD-10-CM)]; Nonrheumatic aortic valve stenosis [I35.0                  (ICD-10-CM)]    History:        Patient has prior history of Echocardiogram examinations,  most                 recent 01/22/2019. Aortic Valve Disease; Risk                  Factors:Hypertension and Dyslipidemia.    Sonographer:    Philipp Deputy RDCS  Referring Phys: [id number] [place]     1. GLS -14.3.  Left ventricular ejection fraction, by estimation, is 50 to  55%. The left ventricle has low normal function. The left ventricle has no  regional wall motion abnormalities. There is moderate left ventricular  hypertrophy. Left ventricular  diastolic parameters are consistent with Grade I diastolic dysfunction  (impaired relaxation).   2. Right ventricular systolic function is normal. The right ventricular  size is normal.   3. Left atrial size was moderately dilated.   4. The mitral valve is normal in structure. Mild mitral valve  regurgitation. No evidence  of mitral stenosis.   5. DI 0.26, AVA 1.1 ( Large LVOT diameter - 25 mm), gradient indicate  severe AS. The aortic valve is bicuspid. There is mild calcification of  the aortic valve. There is moderate thickening of the aortic valve. Aortic  valve regurgitation is mild.  Moderate to severe aortic valve stenosis. Aortic valve mean gradient  measures 47.2 mmHg.   6. There is mild dilatation of the ascending aorta, measuring 39 mm.   7. The inferior vena cava is normal in size with greater than 50%  respiratory variability, suggesting right atrial pressure of 3 mmHg.   FINDINGS   Left Ventricle: GLS -14.3. Left ventricular ejection fraction, by  estimation, is 50 to 55%. The left ventricle has low normal function. The  left ventricle has no regional wall motion abnormalities. The left  ventricular internal cavity size was normal in   size. There is moderate left ventricular hypertrophy. Left ventricular  diastolic parameters are consistent with Grade I diastolic dysfunction  (impaired relaxation).   Right Ventricle: The right ventricular size is normal. No increase in  right ventricular wall thickness. Right ventricular systolic function is  normal.   Left Atrium: Left atrial size was moderately dilated.   Right Atrium: Right atrial size was normal in size.   Pericardium: There is no evidence of pericardial effusion.   Mitral  Valve: The mitral valve is normal in structure. Mild mitral valve  regurgitation. No evidence of mitral valve stenosis.   Tricuspid Valve: The tricuspid valve is normal in structure. Tricuspid  valve regurgitation is not demonstrated. No evidence of tricuspid  stenosis.   Aortic Valve: DI 0.26, AVA 1.1 ( Large LVOT diameter - 25 mm), gradient  indicate severe AS. The aortic valve is bicuspid. There is mild  calcification of the aortic valve. There is moderate thickening of the  aortic valve. Aortic valve regurgitation is  mild. Aortic regurgitation PHT measures 347 msec. Moderate to severe  aortic stenosis is present. Aortic valve mean gradient measures 47.2 mmHg.  Aortic valve peak gradient measures 68.4 mmHg. Aortic valve area, by VTI  measures 1.28 cm.   Pulmonic Valve: The pulmonic valve was normal in structure. Pulmonic valve  regurgitation is mild. No evidence of pulmonic stenosis.   Aorta: The aortic root is normal in size and structure. There is mild  dilatation of the ascending aorta, measuring 39 mm.   Venous: The inferior vena cava is normal in size with greater than 50%  respiratory variability, suggesting right atrial pressure of 3 mmHg.   IAS/Shunts: No atrial level shunt detected by color flow Doppler.     LEFT VENTRICLE  PLAX 2D  LVIDd:         5.30 cm   Diastology  LVIDs:         3.50 cm   LV e' medial:    5.66 cm/s  LV PW:         1.50 cm   LV E/e' medial:  11.7  LV IVS:        1.60 cm   LV e' lateral:   7.62 cm/s  LVOT diam:     2.50 cm   LV E/e' lateral: 8.7  LV SV:         143  LV SV Index:   63  LVOT Area:     4.91 cm     RIGHT VENTRICLE             IVC  RV Basal  diam:  3.40 cm     IVC diam: 1.50 cm  RV Mid diam:    2.70 cm  RV S prime:     10.20 cm/s  TAPSE (M-mode): 2.5 cm   LEFT ATRIUM             Index        RIGHT ATRIUM           Index  LA diam:        4.10 cm 1.81 cm/m   RA Area:     16.80 cm  LA Vol (A2C):   98.1 ml 43.39 ml/m  RA  Volume:   42.30 ml  18.71 ml/m  LA Vol (A4C):   91.5 ml 40.47 ml/m  LA Biplane Vol: 97.7 ml 43.21 ml/m   AORTIC VALVE                     PULMONIC VALVE  AV Area (Vmax):    1.22 cm      PR End Diast Vel: 4.58 msec  AV Area (Vmean):   1.11 cm  AV Area (VTI):     1.28 cm  AV Vmax:           413.60 cm/s  AV Vmean:          330.800 cm/s  AV VTI:            1.118 m  AV Peak Grad:      68.4 mmHg  AV Mean Grad:      47.2 mmHg  LVOT Vmax:         103.00 cm/s  LVOT Vmean:        75.100 cm/s  LVOT VTI:          0.291 m  LVOT/AV VTI ratio: 0.26  AI PHT:            347 msec    AORTA  Ao Root diam: 4.10 cm  Ao Asc diam:  3.90 cm   MITRAL VALVE  MV Area (PHT): 2.39 cm    SHUNTS  MV Decel Time: 317 msec    Systemic VTI:  0.29 m  MR Peak grad: 123.7 mmHg   Systemic Diam: 2.50 cm  MR Mean grad: 93.0 mmHg  MR Vmax:      556.00 cm/s  MR Vmean:     467.0 cm/s  MV E velocity: 66.20 cm/s  MV A velocity: 70.00 cm/s  MV E/A ratio:  0.95   Jenne Campus MD  Electronically signed by Jenne Campus MD  Signature Date/Time: 04/28/2022/5:54:24 PM        Final        ysicians   Panel Physicians Referring Physician Case Authorizing Physician  Early Osmond, MD (Primary)        Procedures   RIGHT HEART CATH AND CORONARY ANGIOGRAPHY    Conclusion       Mid LM lesion is 20% stenosed.   1.  Minimal obstructive coronary artery disease. 2.  Fick cardiac output of 8.5 L/min and Fick cardiac index of 3.7 L/min/m with mean RA pressure of 9 mmHg, mean wedge pressure of 15 mmHg, and mean PA pressure of 23 mmHg.   Recommendation: Cardiothoracic surgical evaluation.   Indications   Aortic stenosis due to bicuspid aortic valve [Q23.0, Q23.1 (ICD-10-CM)]    Procedural Details   Technical Details The patient is a 62 year old male with a history of severe symptomatic bicuspid aortic valve stenosis, hypertension, and hyperlipidemia  who was referred for preprocedural assessment prior  to surgical aortic valve replacement.  After obtaining consent the patient brought to the cardiac catheterization) prepped draped sterile fashion.  Xylocaine was used anesthetize the right wrist in the area around previous placed antecubital IV.  Antecubital IV was exchanged for 7 French sheath and a 6 French femoral glide sheath was placed in the right radial artery.  5000 units heparin 5 mg verapamil were administered through the sheath.  Coronary angiography was performed with a 6 [place] Tig catheter and JR4 catheter.  Right heart catheterization performed with the 7 [place] Swan catheter.  A TR band was placed and manual pressure applied to the right antecubital site.  There are no acute complications. Estimated blood loss <50 mL.   During this procedure medications were administered to achieve and maintain moderate conscious sedation while the patient's heart rate, blood pressure, and oxygen saturation were continuously monitored and I was present face-to-face 100% of this time.    Medications (Filter: Administrations occurring from 1225 to 1339 on 07/13/22) Heparin (Porcine) in NaCl 1000-0.9 UT/500ML-% SOLN (mL)  Total volume: 1,000 mL Date/Time Rate/Dose/Volume Action    07/13/22 1230 500 mL Given    1230 500 mL Given    fentaNYL (SUBLIMAZE) injection (mcg)  Total dose: 25 mcg Date/Time Rate/Dose/Volume Action    07/13/22 1306 25 mcg Given    midazolam (VERSED) injection (mg)  Total dose: 1 mg Date/Time Rate/Dose/Volume Action    07/13/22 1306 1 mg Given    lidocaine (PF) (XYLOCAINE) 1 % injection (mL)  Total volume: 5 mL Date/Time Rate/Dose/Volume Action    07/13/22 1309 5 mL Given    heparin sodium (porcine) injection (Units)  Total dose: 5,000 Units Date/Time Rate/Dose/Volume Action    07/13/22 1311 5,000 Units Given    Radial Cocktail/Verapamil only (mL)  Total volume: 10 mL Date/Time Rate/Dose/Volume Action    07/13/22 1312 10 mL Given    iohexol (OMNIPAQUE) 350 MG/ML  injection (mL)  Total volume: 55 mL Date/Time Rate/Dose/Volume Action    07/13/22 1334 55 mL Given      Sedation Time   Sedation Time Physician-1: 27 minutes 6 seconds Contrast   Medication Name Total Dose  iohexol (OMNIPAQUE) 350 MG/ML injection 55 mL    Radiation/Fluoro   Fluoro time: 7.4 (min) DAP: 19524 (mGycm2) Cumulative Air Kerma: 281 (mGy) Complications   Complications documented before study signed (07/13/2022  1:49 PM)    No complications were associated with this study.  Documented by Ledell PeoplesBrower, Alyssa M, RN - 07/13/2022  1:38 PM      Coronary Findings   Diagnostic Dominance: Right Left Main  Mid LM lesion is 20% stenosed.    Left Anterior Descending  The vessel exhibits minimal luminal irregularities.    Right Coronary Artery  The vessel exhibits minimal luminal irregularities.    Intervention    No interventions have been documented.    Coronary Diagrams   Diagnostic Dominance: Right  Intervention    Implants    No implant documentation for this case.    [employer] Images    Show images for CARDIAC CATHETERIZATION Images on Long Term Storage    Show images for Craig Alvarez, Emery Link to Procedure Log   Procedure Log    Hemo Data   Flowsheet Row Most Recent Value  Fick Cardiac Output 8.55 L/min  Fick Cardiac Output Index 3.76 (L/min)/BSA  RA A Wave 12 mmHg  RA V Wave 11 mmHg  RA Mean 9 mmHg  RV Systolic Pressure 33 mmHg  RV Diastolic Pressure 9 mmHg  RV EDP 13 mmHg  PA Systolic Pressure 30 mmHg  PA Diastolic Pressure 17 mmHg  PA Mean 23 mmHg  PW A Wave 23 mmHg  PW V Wave 19 mmHg  PW Mean 15 mmHg  AO Systolic Pressure 120 mmHg  AO Diastolic Pressure 71 mmHg  AO Mean 92 mmHg  QP/QS 0.76  TPVR Index 8.06 HRUI  TSVR Index 24.49 HRUI  PVR SVR Ratio 0.13  TPVR/TSVR Ratio 0.33      Impression:   This 62 year old gentleman has stage D, severe, symptomatic aortic stenosis with NYHA class III symptoms of exertional fatigue and shortness of  breath consistent with chronic diastolic congestive heart failure.  I agree that aortic valve replacement is indicated in this patient for relief of his symptoms and to prevent progressive left ventricular dysfunction.  Given his relatively young age I think that open surgical aortic valve replacement is the best option for him.  His aortic root and ascending aorta appear to be of normal size on echocardiogram.  I discussed the alternatives of mechanical and bioprosthetic valves with him.  He does not want to be on Coumadin and would like to use a bioprosthetic valve.  I think that is very reasonable at his age. I discussed the operative procedure with the patient including alternatives, benefits and risks; including but not limited to bleeding, blood transfusion, infection, stroke, myocardial infarction, graft failure, heart block requiring a permanent pacemaker, organ dysfunction, and death.  Craig Alvarez understands and agrees to proceed.        Plan:   Aortic valve replacement using a bioprosthetic valve.        Alleen Borne, MD [facility] [phone]

## 2022-08-04 NOTE — Procedures (Signed)
Extubation Procedure Note  Patient Details:   Name: Craig Alvarez DOB: 1961/05/14 MRN: [id number]   Airway Documentation:    Vent end date: 08/04/22 Vent end time: 1640   Evaluation  O2 sats: stable throughout Complications: No apparent complications Patient did tolerate procedure well. Bilateral Breath Sounds: Pleural rub   Yes  Patient tolerated rapid wean. NIF -22, VC 0.92 L. Positive for cuff leak. Patient extubated to a 4 Lpm nasal cannula. No signs of dyspnea or stridor noted. Patient achieved 750 mL on the [employer]. RN at bedside.   Myrtie Neither 08/04/2022, 4:49 PM

## 2022-08-04 NOTE — Progress Notes (Signed)
EVENING ROUNDS NOTE :     [address]       [place] [address]             [phone]                 Day of Surgery Procedure(s) (LRB): AORTIC VALVE REPLACEMENT (AVR) USING EDWARDS 27 MM INSPIRIS RESILIA AORTIC VALVE (N/A) TRANSESOPHAGEAL ECHOCARDIOGRAM (TEE) (N/A)   Total Length of Stay:  LOS: 0 days  Events:   Extubated Minimal CT output Good hemodynamics    BP 112/64   Pulse 80   Temp 98.8 F (37.1 C)   Resp (!) 26   Ht 6' (1.829 m)   Wt 106.6 kg   SpO2 96%   BMI 31.87 kg/m   PAP: (26-42)/(19-23) 31/20 CO:  [4.4 L/min-5.2 L/min] 5.2 L/min CI:  [1.9 L/min/m2-2.3 L/min/m2] 2.3 L/min/m2  Vent Mode: CPAP;PSV FiO2 (%):  [40 %-50 %] 40 % Set Rate:  [4 bmp-16 bmp] 4 bmp Vt Set:  [620 mL] 620 mL PEEP:  [5 cmH20] 5 cmH20 Pressure Support:  [10 cmH20] 10 cmH20 Plateau Pressure:  [15 cmH20-20 cmH20] 15 cmH20   sodium chloride     [START ON 08/05/2022] sodium chloride     sodium chloride 10 mL/hr at 08/04/22 1800   albumin human 60 mL/hr at 08/04/22 1800    ceFAZolin (ANCEF) IV Stopped (08/04/22 1417)   dexmedetomidine (PRECEDEX) IV infusion Stopped (08/04/22 1620)   famotidine (PEPCID) IV Stopped (08/04/22 1452)   insulin 0.8 Units/hr (08/04/22 1800)   lactated ringers     lactated ringers     lactated ringers 20 mL/hr at 08/04/22 1800   magnesium sulfate 20 mL/hr at 08/04/22 1800   nitroGLYCERIN     phenylephrine (NEO-SYNEPHRINE) Adult infusion Stopped (08/04/22 1749)   vancomycin      No intake/output data recorded.      Latest Ref Rng & Units 08/04/2022    5:50 PM 08/04/2022    4:36 PM 08/04/2022    2:08 PM  CBC  Hemoglobin 13.0 - 17.0 g/dL 11.2  10.9  10.2   Hematocrit 39.0 - 52.0 % 33.0  32.0  30.0        Latest Ref Rng & Units 08/04/2022    5:50 PM 08/04/2022    4:36 PM 08/04/2022    2:08 PM  BMP  Sodium 135 - 145 mmol/L 138  139  139   Potassium 3.5 - 5.1 mmol/L 4.3  4.6  4.4     ABG    Component Value Date/Time    PHART 7.368 08/04/2022 1750   PCO2ART 40.2 08/04/2022 1750   PO2ART 93 08/04/2022 1750   HCO3 23.1 08/04/2022 1750   TCO2 24 08/04/2022 1750   ACIDBASEDEF 2.0 08/04/2022 1750   O2SAT 97 08/04/2022 1750       Payton Moder, MD 08/04/2022 6:12 PM

## 2022-08-04 NOTE — Brief Op Note (Signed)
08/04/2022  12:27 PM  PATIENT:  Craig Alvarez  62 y.o. male  PRE-OPERATIVE DIAGNOSIS:  SEVERE AORTIC STENOSIS  POST-OPERATIVE DIAGNOSIS:  SEVERE AORTIC STENOSIS  PROCEDURE:  Procedure(s) with comments: AORTIC VALVE REPLACEMENT (AVR) USING EDWARDS 27 MM INSPIRIS RESILIA AORTIC VALVE (N/A) - Median sternotomy TRANSESOPHAGEAL ECHOCARDIOGRAM (TEE) (N/A)  SURGEON:  Surgeon(s) and Role:    * Bartle, Fernande Boyden, MD - Primary  PHYSICIAN ASSISTANT: none  ASSISTANTS: Demetrius Morif MD([facility] resident)   ANESTHESIA:   general  EBL:  1491 mL   BLOOD ADMINISTERED:none  DRAINS:  mediastinal chest tubes    LOCAL MEDICATIONS USED:  NONE  SPECIMEN:  Source of Specimen:  aortic valve leaflets  DISPOSITION OF SPECIMEN:  PATHOLOGY  COUNTS:  YES  TOURNIQUET:  * No tourniquets in log *  DICTATION: .Dragon Dictation  PLAN OF CARE: Admit to inpatient   PATIENT DISPOSITION:  ICU - intubated and hemodynamically stable.   Delay start of Pharmacological VTE agent (>24hrs) due to surgical blood loss or risk of bleeding: yes  Complications- no known

## 2022-08-04 NOTE — Anesthesia Procedure Notes (Signed)
Central Venous Catheter Insertion Performed by: Darral Dash, DO, anesthesiologist Start/End1/26/2024 7:00 AM, 08/04/2022 7:10 AM Patient location: Pre-op. Preanesthetic checklist: patient identified, IV checked, site marked, risks and benefits discussed, surgical consent, monitors and equipment checked, pre-op evaluation, timeout performed and anesthesia consent Position: Trendelenburg Lidocaine 1% used for infiltration and patient sedated Hand hygiene performed  and maximum sterile barriers used  Catheter size: 8.5 Fr Central line was placed.Sheath introducer Procedure performed using ultrasound guided technique. Ultrasound Notes:anatomy identified, needle tip was noted to be adjacent to the nerve/plexus identified, no ultrasound evidence of intravascular and/or intraneural injection and image(s) printed for medical record Attempts: 1 Following insertion, line sutured, dressing applied and Biopatch. Post procedure assessment: blood return through all ports, free fluid flow and no air  Patient tolerated the procedure well with no immediate complications.

## 2022-08-04 NOTE — Anesthesia Procedure Notes (Signed)
Central Venous Catheter Insertion Performed by: Darral Dash, DO, anesthesiologist Start/End1/26/2024 7:11 AM, 08/04/2022 7:12 AM Patient location: Pre-op. Preanesthetic checklist: patient identified, IV checked, site marked, risks and benefits discussed, surgical consent, monitors and equipment checked, pre-op evaluation, timeout performed and anesthesia consent Position: supine Hand hygiene performed  and maximum sterile barriers used  PA cath was placed.Swan type:thermodilution PA Cath depth:45 Procedure performed without using ultrasound guided technique. Attempts: 1 Patient tolerated the procedure well with no immediate complications.

## 2022-08-04 NOTE — Transfer of Care (Signed)
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 RESILIA AORTIC VALVE (Chest) TRANSESOPHAGEAL ECHOCARDIOGRAM (TEE)  Patient Location: SICU  Anesthesia Type:General  Level of Consciousness: Patient remains intubated per anesthesia plan  Airway & Oxygen Therapy: Patient remains intubated per anesthesia plan and Patient placed on Ventilator (see vital sign flow sheet for setting)  Post-op Assessment: Report given to RN and Post -op Vital signs reviewed and stable  Post vital signs: Reviewed and stable  Last Vitals:  Vitals Value Taken Time  BP 107/57 08/04/22 1245  Temp    Pulse 80 08/04/22 1245  Resp 16 08/04/22 1245  SpO2 95 % 08/04/22 1245    Last Pain:  Vitals:   08/04/22 0623  TempSrc:   PainSc: 0-No pain         Complications: No notable events documented.

## 2022-08-05 ENCOUNTER — Inpatient Hospital Stay (HOSPITAL_COMMUNITY): Payer: 59

## 2022-08-05 LAB — GLUCOSE, CAPILLARY
Glucose-Capillary: 134 mg/dL — ABNORMAL HIGH (ref 70–99)
Glucose-Capillary: 138 mg/dL — ABNORMAL HIGH (ref 70–99)
Glucose-Capillary: 140 mg/dL — ABNORMAL HIGH (ref 70–99)
Glucose-Capillary: 142 mg/dL — ABNORMAL HIGH (ref 70–99)
Glucose-Capillary: 143 mg/dL — ABNORMAL HIGH (ref 70–99)
Glucose-Capillary: 145 mg/dL — ABNORMAL HIGH (ref 70–99)
Glucose-Capillary: 148 mg/dL — ABNORMAL HIGH (ref 70–99)
Glucose-Capillary: 148 mg/dL — ABNORMAL HIGH (ref 70–99)
Glucose-Capillary: 153 mg/dL — ABNORMAL HIGH (ref 70–99)
Glucose-Capillary: 153 mg/dL — ABNORMAL HIGH (ref 70–99)
Glucose-Capillary: 154 mg/dL — ABNORMAL HIGH (ref 70–99)
Glucose-Capillary: 165 mg/dL — ABNORMAL HIGH (ref 70–99)
Glucose-Capillary: 169 mg/dL — ABNORMAL HIGH (ref 70–99)

## 2022-08-05 LAB — CBC
HCT: 35 % — ABNORMAL LOW (ref 39.0–52.0)
HCT: 34.5 % — ABNORMAL LOW (ref 39.0–52.0)
Hemoglobin: 11.9 g/dL — ABNORMAL LOW (ref 13.0–17.0)
Hemoglobin: 11.9 g/dL — ABNORMAL LOW (ref 13.0–17.0)
MCH: 32.8 pg (ref 26.0–34.0)
MCH: 33.3 pg (ref 26.0–34.0)
MCHC: 34 g/dL (ref 30.0–36.0)
MCHC: 34.5 g/dL (ref 30.0–36.0)
MCV: 96.4 fL (ref 80.0–100.0)
MCV: 96.6 fL (ref 80.0–100.0)
Platelets: 141 K/uL — ABNORMAL LOW (ref 150–400)
Platelets: 147 10*3/uL — ABNORMAL LOW (ref 150–400)
RBC: 3.63 MIL/uL — ABNORMAL LOW (ref 4.22–5.81)
RBC: 3.57 MIL/uL — ABNORMAL LOW (ref 4.22–5.81)
RDW: 13.2 % (ref 11.5–15.5)
RDW: 13.3 % (ref 11.5–15.5)
WBC: 11.4 K/uL — ABNORMAL HIGH (ref 4.0–10.5)
WBC: 12.7 10*3/uL — ABNORMAL HIGH (ref 4.0–10.5)
nRBC: 0 % (ref 0.0–0.2)
nRBC: 0 % (ref 0.0–0.2)

## 2022-08-05 LAB — BASIC METABOLIC PANEL
Anion gap: 11 (ref 5–15)
Anion gap: 9 (ref 5–15)
BUN: 21 mg/dL (ref 8–23)
BUN: 23 mg/dL (ref 8–23)
CO2: 23 mmol/L (ref 22–32)
CO2: 29 mmol/L (ref 22–32)
Calcium: 7.9 mg/dL — ABNORMAL LOW (ref 8.9–10.3)
Calcium: 8.1 mg/dL — ABNORMAL LOW (ref 8.9–10.3)
Chloride: 100 mmol/L (ref 98–111)
Chloride: 94 mmol/L — ABNORMAL LOW (ref 98–111)
Creatinine, Ser: 1.06 mg/dL (ref 0.61–1.24)
Creatinine, Ser: 1.2 mg/dL (ref 0.61–1.24)
GFR, Estimated: >60 mL/min (ref >60)
GFR, Estimated: >60 mL/min (ref >60)
Glucose, Bld: 135 mg/dL — ABNORMAL HIGH (ref 70–99)
Glucose, Bld: 164 mg/dL — ABNORMAL HIGH (ref 70–99)
Potassium: 4.3 mmol/L (ref 3.5–5.1)
Potassium: 4 mmol/L (ref 3.5–5.1)
Sodium: 134 mmol/L — ABNORMAL LOW (ref 135–145)
Sodium: 132 mmol/L — ABNORMAL LOW (ref 135–145)

## 2022-08-05 LAB — MAGNESIUM
Magnesium: 2.5 mg/dL — ABNORMAL HIGH (ref 1.7–2.4)
Magnesium: 2.4 mg/dL (ref 1.7–2.4)

## 2022-08-05 MED ORDER — SODIUM CHLORIDE 0.9 % IV SOLN
6.2500 mg | Freq: Four times a day (QID) | INTRAVENOUS | Status: DC | PRN
  Administered 2022-08-05 – 2022-08-06 (×2): 6.25 mg via INTRAVENOUS
  Filled 2022-08-05 (×3): qty 0.25

## 2022-08-05 MED ORDER — INSULIN ASPART 100 UNIT/ML IJ SOLN
0.0000 [IU] | INTRAMUSCULAR | Status: DC
  Administered 2022-08-05: 2 [IU] via SUBCUTANEOUS

## 2022-08-05 MED ORDER — FUROSEMIDE 10 MG/ML IJ SOLN
40.0000 mg | Freq: Two times a day (BID) | INTRAMUSCULAR | Status: AC
  Administered 2022-08-05 (×2): 40 mg via INTRAVENOUS
  Filled 2022-08-05 (×2): qty 4

## 2022-08-05 MED ORDER — ROPINIROLE HCL 0.5 MG PO TABS
0.5000 mg | ORAL_TABLET | Freq: Every day | ORAL | Status: DC
  Administered 2022-08-05 – 2022-08-09 (×5): 0.5 mg via ORAL
  Filled 2022-08-05 (×5): qty 1

## 2022-08-05 MED ORDER — METHOCARBAMOL 500 MG PO TABS
500.0000 mg | ORAL_TABLET | Freq: Three times a day (TID) | ORAL | Status: DC
  Administered 2022-08-05 – 2022-08-06 (×5): 500 mg via ORAL
  Filled 2022-08-05 (×5): qty 1

## 2022-08-05 MED ORDER — ALPRAZOLAM 0.5 MG PO TABS
0.5000 mg | ORAL_TABLET | Freq: Every evening | ORAL | Status: DC | PRN
  Administered 2022-08-05 – 2022-08-07 (×3): 0.5 mg via ORAL
  Filled 2022-08-05 (×4): qty 1

## 2022-08-05 MED ORDER — INSULIN ASPART 100 UNIT/ML IJ SOLN
0.0000 [IU] | INTRAMUSCULAR | Status: DC
  Administered 2022-08-05: 2 [IU] via SUBCUTANEOUS
  Administered 2022-08-05: 4 [IU] via SUBCUTANEOUS
  Administered 2022-08-05 – 2022-08-06 (×4): 2 [IU] via SUBCUTANEOUS
  Administered 2022-08-06: 4 [IU] via SUBCUTANEOUS
  Administered 2022-08-06: 2 [IU] via SUBCUTANEOUS
  Administered 2022-08-06: 4 [IU] via SUBCUTANEOUS
  Administered 2022-08-07 (×2): 2 [IU] via SUBCUTANEOUS

## 2022-08-05 MED ORDER — POTASSIUM CHLORIDE CRYS ER 20 MEQ PO TBCR
20.0000 meq | EXTENDED_RELEASE_TABLET | Freq: Two times a day (BID) | ORAL | Status: AC
  Administered 2022-08-05 (×2): 20 meq via ORAL
  Filled 2022-08-05 (×2): qty 1

## 2022-08-05 NOTE — Progress Notes (Signed)
[address]       [place] [address]             [phone]                 1 Day Post-Op Procedure(s) (LRB): AORTIC VALVE REPLACEMENT (AVR) USING EDWARDS 27 MM INSPIRIS RESILIA AORTIC VALVE (N/A) TRANSESOPHAGEAL ECHOCARDIOGRAM (TEE) (N/A)   Events: No events Nausea this am _______________________________________________________________ Vitals: BP 135/65 (BP Location: Right Arm)   Pulse 86   Temp 98.8 F (37.1 C) (Core)   Resp 14   Ht 6' (1.829 m)   Wt 111.6 kg   SpO2 95%   BMI 33.37 kg/m  Filed Weights   08/04/22 0547 08/05/22 0500  Weight: 106.6 kg 111.6 kg     - Neuro: alert NAD  - Cardiovascular: sinu  Drips: non.   PAP: (23-42)/(8-23) 29/18 CO:  [4.4 L/min-6.1 L/min] 6.1 L/min CI:  [1.9 L/min/m2-2.7 L/min/m2] 2.7 L/min/m2  - Pulm: EWOB   ABG    Component Value Date/Time   PHART 7.368 08/04/2022 1750   PCO2ART 40.2 08/04/2022 1750   PO2ART 93 08/04/2022 1750   HCO3 23.1 08/04/2022 1750   TCO2 24 08/04/2022 1750   ACIDBASEDEF 2.0 08/04/2022 1750   O2SAT 97 08/04/2022 1750    - Abd: ND - Extremity: warm  .Intake/Output      01/26 0701 01/27 0700 01/27 0701 01/28 0700   P.O. 720    I.V. (mL/kg) 2523.6 (22.6) 120.9 (1.1)   Blood 795    IV Piggyback 1490.1    Total Intake(mL/kg) 5528.7 (49.5) 120.9 (1.1)   Urine (mL/kg/hr) 2980 (1.1) 1275 (2.4)   Blood 1491    Chest Tube 640 50   Total Output 5111 1325   Net +417.7 -1204.1           _______________________________________________________________ Labs:    Latest Ref Rng & Units 08/05/2022    3:00 AM 08/04/2022    6:38 PM 08/04/2022    5:50 PM  CBC  WBC 4.0 - 10.5 K/uL 11.4  8.1    Hemoglobin 13.0 - 17.0 g/dL 11.9  11.9  11.2   Hematocrit 39.0 - 52.0 % 35.0  33.0  33.0   Platelets 150 - 400 K/uL 141  119        Latest Ref Rng & Units 08/05/2022    3:00 AM 08/04/2022    6:38 PM 08/04/2022    5:50 PM  CMP  Glucose 70 - 99 mg/dL 135  120    BUN 8 - 23 mg/dL  21  23    Creatinine 0.61 - 1.24 mg/dL 1.06  1.00    Sodium 135 - 145 mmol/L 134  137  138   Potassium 3.5 - 5.1 mmol/L 4.3  4.1  4.3   Chloride 98 - 111 mmol/L 100  107    CO2 22 - 32 mmol/L 23  24    Calcium 8.9 - 10.3 mg/dL 7.9  7.9      CXR: PV congestion  _______________________________________________________________  Assessment and Plan: POD 1 s/p bAVR  Neuro: adjusting pain meds CV: will remove swan, and A line. Keeping wires Pulm: IS, ambulation Renal: creat stable, diuresing GI: adding phenergan.  On clears Heme: stable ID: [email or web address] Endo: SSI Dispo: continue ICU care   Lajuana Matte 08/05/2022 11:51 AM

## 2022-08-05 NOTE — Anesthesia Postprocedure Evaluation (Signed)
Anesthesia Post Note  Patient: Tilford Deaton  Procedure(s) Performed: AORTIC VALVE REPLACEMENT (AVR) USING EDWARDS 27 MM INSPIRIS RESILIA AORTIC VALVE (Chest) TRANSESOPHAGEAL ECHOCARDIOGRAM (TEE)     Patient location during evaluation: SICU Anesthesia Type: General Level of consciousness: sedated Pain management: pain level controlled Vital Signs Assessment: post-procedure vital signs reviewed and stable Respiratory status: patient remains intubated per anesthesia plan Cardiovascular status: stable Postop Assessment: no apparent nausea or vomiting Anesthetic complications: no   No notable events documented.  Last Vitals:  Vitals:   08/05/22 0300 08/05/22 0400  BP: 127/87 (!) 142/84  Pulse: 99 98  Resp: 20 (!) 22  Temp: 37.8 C 37.7 C  SpO2: 94% 94%    Last Pain:  Vitals:   08/05/22 0400  TempSrc: Core  PainSc: 4                  Gesenia Bantz P Mallary Kreger

## 2022-08-06 ENCOUNTER — Encounter (HOSPITAL_COMMUNITY): Payer: Self-pay | Admitting: Surgery

## 2022-08-06 ENCOUNTER — Inpatient Hospital Stay (HOSPITAL_COMMUNITY): Payer: 59

## 2022-08-06 LAB — CBC
HCT: 32.1 % — ABNORMAL LOW (ref 39.0–52.0)
Hemoglobin: 11 g/dL — ABNORMAL LOW (ref 13.0–17.0)
MCH: 33.2 pg (ref 26.0–34.0)
MCHC: 34.3 g/dL (ref 30.0–36.0)
MCV: 97 fL (ref 80.0–100.0)
Platelets: 124 10*3/uL — ABNORMAL LOW (ref 150–400)
RBC: 3.31 MIL/uL — ABNORMAL LOW (ref 4.22–5.81)
RDW: 13.2 % (ref 11.5–15.5)
WBC: 12.2 10*3/uL — ABNORMAL HIGH (ref 4.0–10.5)
nRBC: 0 % (ref 0.0–0.2)

## 2022-08-06 LAB — BASIC METABOLIC PANEL
Anion gap: 9 (ref 5–15)
BUN: 25 mg/dL — ABNORMAL HIGH (ref 8–23)
CO2: 30 mmol/L (ref 22–32)
Calcium: 8.4 mg/dL — ABNORMAL LOW (ref 8.9–10.3)
Chloride: 94 mmol/L — ABNORMAL LOW (ref 98–111)
Creatinine, Ser: 1.02 mg/dL (ref 0.61–1.24)
GFR, Estimated: >60 mL/min (ref >60)
Glucose, Bld: 148 mg/dL — ABNORMAL HIGH (ref 70–99)
Potassium: 4 mmol/L (ref 3.5–5.1)
Sodium: 133 mmol/L — ABNORMAL LOW (ref 135–145)

## 2022-08-06 LAB — GLUCOSE, CAPILLARY
Glucose-Capillary: 126 mg/dL — ABNORMAL HIGH (ref 70–99)
Glucose-Capillary: 139 mg/dL — ABNORMAL HIGH (ref 70–99)
Glucose-Capillary: 143 mg/dL — ABNORMAL HIGH (ref 70–99)
Glucose-Capillary: 158 mg/dL — ABNORMAL HIGH (ref 70–99)
Glucose-Capillary: 165 mg/dL — ABNORMAL HIGH (ref 70–99)
Glucose-Capillary: 173 mg/dL — ABNORMAL HIGH (ref 70–99)

## 2022-08-06 MED ORDER — FUROSEMIDE 40 MG PO TABS
40.0000 mg | ORAL_TABLET | Freq: Every day | ORAL | Status: DC
  Administered 2022-08-06 – 2022-08-07 (×2): 40 mg via ORAL
  Filled 2022-08-06 (×2): qty 1

## 2022-08-06 MED ORDER — POTASSIUM CHLORIDE CRYS ER 20 MEQ PO TBCR
40.0000 meq | EXTENDED_RELEASE_TABLET | Freq: Every day | ORAL | Status: DC
  Administered 2022-08-06: 40 meq via ORAL
  Filled 2022-08-06: qty 2

## 2022-08-06 NOTE — Progress Notes (Signed)
EVENING ROUNDS NOTE :     [address]       [place] [address]             [phone]                 2 Days Post-Op Procedure(s) (LRB): AORTIC VALVE REPLACEMENT (AVR) USING EDWARDS 27 MM INSPIRIS RESILIA AORTIC VALVE (N/A) TRANSESOPHAGEAL ECHOCARDIOGRAM (TEE) (N/A)   Total Length of Stay:  LOS: 2 days  Events:   No events Awaiting floor bed    BP (!) 150/86 (BP Location: Right Arm)   Pulse 97   Temp 98.1 F (36.7 C) (Oral)   Resp 16   Ht 6' (1.829 m)   Wt 108.3 kg   SpO2 93%   BMI 32.39 kg/m          sodium chloride     sodium chloride     sodium chloride Stopped (08/06/22 0541)   lactated ringers     lactated ringers Stopped (08/05/22 1333)   promethazine (PHENERGAN) injection (IM or IVPB) Stopped (08/06/22 0839)    I/O last 3 completed shifts: In: 738.9 [I.V.:339.1; IV Piggyback:399.9] Out: 2415 [Urine:2365; Chest Tube:50]      Latest Ref Rng & Units 08/06/2022    3:37 AM 08/05/2022    5:11 PM 08/05/2022    3:00 AM  CBC  WBC 4.0 - 10.5 K/uL 12.2  12.7  11.4   Hemoglobin 13.0 - 17.0 g/dL 11.0  11.9  11.9   Hematocrit 39.0 - 52.0 % 32.1  34.5  35.0   Platelets 150 - 400 K/uL 124  147  141        Latest Ref Rng & Units 08/06/2022    3:37 AM 08/05/2022    5:11 PM 08/05/2022    3:00 AM  BMP  Glucose 70 - 99 mg/dL 148  164  135   BUN 8 - 23 mg/dL 25  23  21    Creatinine 0.61 - 1.24 mg/dL 1.02  1.20  1.06   Sodium 135 - 145 mmol/L 133  132  134   Potassium 3.5 - 5.1 mmol/L 4.0  4.0  4.3   Chloride 98 - 111 mmol/L 94  94  100   CO2 22 - 32 mmol/L 30  29  23    Calcium 8.9 - 10.3 mg/dL 8.4  8.1  7.9     ABG    Component Value Date/Time   PHART 7.368 08/04/2022 1750   PCO2ART 40.2 08/04/2022 1750   PO2ART 93 08/04/2022 1750   HCO3 23.1 08/04/2022 1750   TCO2 24 08/04/2022 1750   ACIDBASEDEF 2.0 08/04/2022 1750   O2SAT 97 08/04/2022 1750       Melodie Bouillon, MD 08/06/2022 8:36 PM

## 2022-08-06 NOTE — Progress Notes (Signed)
[address]       [place] [address]             [phone]                 2 Days Post-Op Procedure(s) (LRB): AORTIC VALVE REPLACEMENT (AVR) USING EDWARDS 27 MM INSPIRIS RESILIA AORTIC VALVE (N/A) TRANSESOPHAGEAL ECHOCARDIOGRAM (TEE) (N/A)   Events: No events Nausea this am _______________________________________________________________ Vitals: BP (!) 137/102 (BP Location: Right Arm)   Pulse 79   Temp 97.7 F (36.5 C) (Oral)   Resp 10   Ht 6' (1.829 m)   Wt 108.3 kg   SpO2 95%   BMI 32.39 kg/m  Filed Weights   08/04/22 0547 08/05/22 0500 08/06/22 0500  Weight: 106.6 kg 111.6 kg 108.3 kg     - Neuro: alert NAD  - Cardiovascular: sinu  Drips: none.   PAP: (29)/(14) 29/14  - Pulm: EWOB   ABG    Component Value Date/Time   PHART 7.368 08/04/2022 1750   PCO2ART 40.2 08/04/2022 1750   PO2ART 93 08/04/2022 1750   HCO3 23.1 08/04/2022 1750   TCO2 24 08/04/2022 1750   ACIDBASEDEF 2.0 08/04/2022 1750   O2SAT 97 08/04/2022 1750    - Abd: ND - Extremity: warm  .Intake/Output      01/27 0701 01/28 0700 01/28 0701 01/29 0700   P.O.     I.V. (mL/kg) 339.1 (3.1)    Blood     IV Piggyback 349.9    Total Intake(mL/kg) 688.9 (6.4)    Urine (mL/kg/hr) 2365 (0.9)    Blood     Chest Tube 50    Total Output 2415    Net -1726.1            _______________________________________________________________ Labs:    Latest Ref Rng & Units 08/06/2022    3:37 AM 08/05/2022    5:11 PM 08/05/2022    3:00 AM  CBC  WBC 4.0 - 10.5 K/uL 12.2  12.7  11.4   Hemoglobin 13.0 - 17.0 g/dL 11.0  11.9  11.9   Hematocrit 39.0 - 52.0 % 32.1  34.5  35.0   Platelets 150 - 400 K/uL 124  147  141       Latest Ref Rng & Units 08/06/2022    3:37 AM 08/05/2022    5:11 PM 08/05/2022    3:00 AM  CMP  Glucose 70 - 99 mg/dL 148  164  135   BUN 8 - 23 mg/dL 25  23  21    Creatinine 0.61 - 1.24 mg/dL 1.02  1.20  1.06   Sodium 135 - 145 mmol/L 133  132  134    Potassium 3.5 - 5.1 mmol/L 4.0  4.0  4.3   Chloride 98 - 111 mmol/L 94  94  100   CO2 22 - 32 mmol/L 30  29  23    Calcium 8.9 - 10.3 mg/dL 8.4  8.1  7.9     CXR: PV congestion  _______________________________________________________________  Assessment and Plan: POD 2 s/p bAVR  Neuro: adjusting pain meds CV: Keeping wires Pulm: IS, ambulation Renal: creat stable, diuresing GI: nausea improved.  Advancing diet Heme: stable ID: afebrile Endo: SSI Dispo: floor   Tawnia Schirm O Murray Durrell 08/06/2022 11:25 AM

## 2022-08-07 LAB — SURGICAL PATHOLOGY

## 2022-08-07 LAB — GLUCOSE, CAPILLARY
Glucose-Capillary: 137 mg/dL — ABNORMAL HIGH (ref 70–99)
Glucose-Capillary: 121 mg/dL — ABNORMAL HIGH (ref 70–99)

## 2022-08-07 MED ORDER — METOPROLOL TARTRATE 25 MG/10 ML ORAL SUSPENSION
25.0000 mg | Freq: Two times a day (BID) | ORAL | Status: DC
  Filled 2022-08-07: qty 10

## 2022-08-07 MED ORDER — POTASSIUM CHLORIDE CRYS ER 20 MEQ PO TBCR
20.0000 meq | EXTENDED_RELEASE_TABLET | Freq: Every day | ORAL | Status: DC
  Administered 2022-08-07: 20 meq via ORAL
  Filled 2022-08-07: qty 1

## 2022-08-07 MED ORDER — METOPROLOL TARTRATE 25 MG PO TABS
25.0000 mg | ORAL_TABLET | Freq: Two times a day (BID) | ORAL | Status: DC
  Administered 2022-08-07 – 2022-08-08 (×4): 25 mg via ORAL
  Filled 2022-08-07 (×4): qty 1

## 2022-08-07 MED ORDER — METOCLOPRAMIDE HCL 10 MG PO TABS
10.0000 mg | ORAL_TABLET | Freq: Three times a day (TID) | ORAL | Status: AC
  Administered 2022-08-07 – 2022-08-08 (×6): 10 mg via ORAL
  Filled 2022-08-07 (×6): qty 1

## 2022-08-07 MED FILL — Thrombin (Recombinant) For Soln 20000 Unit: CUTANEOUS | Qty: 1 | Status: AC

## 2022-08-07 NOTE — Progress Notes (Signed)
CARDIAC REHAB PHASE I   PRE:  Rate/Rhythm: 90 SR  BP:  Sitting: 133/88      SaO2: 96 RA  MODE:  Ambulation: 190 ft   POST:  Rate/Rhythm: 108 ST  BP:  Sitting: 150/98      SaO2: 93 RA  Pt ambulated in hall using four wheel walker. Tolerated well with no pain, dizziness or SOB. Maintaining slow steady pace, c/o of generalized weakness. Returned to bed with call bell and bedside table in reach. Will continue to follow.   [phone]  Vanessa Barbara, RN BSN 08/07/2022 1:36 PM

## 2022-08-07 NOTE — Care Management (Signed)
  Transition of Care [facility]) Screening Note   Patient Details  Name: Craig Alvarez Date of Birth: 1961/03/20   [facility]) CM/SW Contact:    Bethena Roys, RN Phone Number: 08/07/2022, 5:04 PM    Transition of Care Department [facility]) has reviewed the patient and no TOC needs have been identified at this time. Patient POD-3 AVR. We will continue to monitor patient advancement through interdisciplinary progression rounds. If new patient transition needs arise, please place a TOC consult.

## 2022-08-07 NOTE — Progress Notes (Signed)
3 Days Post-Op Procedure(s) (LRB): AORTIC VALVE REPLACEMENT (AVR) USING EDWARDS 27 MM INSPIRIS RESILIA AORTIC VALVE (N/A) TRANSESOPHAGEAL ECHOCARDIOGRAM (TEE) (N/A) Subjective: Nausea improving. Passing flatus but no BM yet. Has been drinking liquids but no food yet since surgery.   Objective: Vital signs in last 24 hours: Temp:  [97.7 F (36.5 C)-99.1 F (37.3 C)] 98 F (36.7 C) (01/29 0400) Pulse Rate:  [74-105] 100 (01/29 0600) Cardiac Rhythm: Normal sinus rhythm;Sinus tachycardia (01/28 1930) Resp:  [8-23] 21 (01/29 0600) BP: (117-174)/(73-159) 147/85 (01/29 0600) SpO2:  [85 %-97 %] 93 % (01/29 0600) Weight:  [107.2 kg] 107.2 kg (01/29 0500)  Hemodynamic parameters for last 24 hours:    Intake/Output from previous day: 01/28 0701 - 01/29 0700 In: 530 [P.O.:480; IV Piggyback:50] Out: 825 [Urine:825] Intake/Output this shift: Total I/O In: 480 [P.O.:480] Out: 825 [Urine:825]  General appearance: alert and cooperative Neurologic: intact Heart: regular rate and rhythm, S1, S2 normal, no murmur Lungs: clear to auscultation bilaterally Abdomen: soft, non-tender; bowel sounds normal Extremities: no edema Wound: dressing dry  Lab Results: Recent Labs    08/05/22 1711 08/06/22 0337  WBC 12.7* 12.2*  HGB 11.9* 11.0*  HCT 34.5* 32.1*  PLT 147* 124*   BMET:  Recent Labs    08/05/22 1711 08/06/22 0337  NA 132* 133*  K 4.0 4.0  CL 94* 94*  CO2 29 30  GLUCOSE 164* 148*  BUN 23 25*  CREATININE 1.20 1.02  CALCIUM 8.1* 8.4*    PT/INR:  Recent Labs    08/04/22 1302  LABPROT 16.3*  INR 1.3*   ABG    Component Value Date/Time   PHART 7.368 08/04/2022 1750   HCO3 23.1 08/04/2022 1750   TCO2 24 08/04/2022 1750   ACIDBASEDEF 2.0 08/04/2022 1750   O2SAT 97 08/04/2022 1750   CBG (last 3)  Recent Labs    08/06/22 2021 08/06/22 2332 08/07/22 0420  GLUCAP 126* 143* 137*    Assessment/Plan: S/P Procedure(s) (LRB): AORTIC VALVE REPLACEMENT (AVR) USING  EDWARDS 27 MM INSPIRIS RESILIA AORTIC VALVE (N/A) TRANSESOPHAGEAL ECHOCARDIOGRAM (TEE) (N/A) POD3  Hemodynamically stable in sinus rhythm. BP 130-140's so will increase Lopressor to 25 bid.  Wt only 1 lb over preop. On daily lasix.  Glucose under good control with normal Hgb A1c on no meds. Stop CBG's and SSI.  Nausea improved. Advance diet as tolerated. Not clear to me if this is related to pain meds or not. Add Reglan for a few days and phenergan prn.  DC pacing wires.  Waiting on 4E bed.  Continue IS, ambulation.   LOS: 3 days    Craig Alvarez 08/07/2022

## 2022-08-07 NOTE — Progress Notes (Signed)
[address]       [place] [address]             [phone]       EVENING ROUNDS  SP AVR POD #3 Hemodynamics good  Floor status

## 2022-08-08 MED ORDER — LACTULOSE 10 GM/15ML PO SOLN
20.0000 g | Freq: Once | ORAL | Status: AC
  Administered 2022-08-08: 20 g via ORAL
  Filled 2022-08-08: qty 30

## 2022-08-08 NOTE — Progress Notes (Signed)
CARDIAC REHAB PHASE I   Pt sitting oob sitting in chair. Feeling well today. Ambulated once in hall, reports tolerating well with no dizziness or SOB. Encouraged continued ambulation and IS use today. Post OHS education including site care, restrictions, risk factors, sternal precautions, exercise guidelines, heart healthy diet, IS use at home, home needs at discharge and CRP2 reviewed. All questions and concerns addressed. Will refer to [facility] for [place]. Will continue to follow.   [phone]  Vanessa Barbara, RN BSN 08/08/2022 11:15 AM

## 2022-08-08 NOTE — Discharge Summary (Signed)
Physician Discharge Summary       [address]       [place] [address]             [phone]    Patient ID: Craig Alvarez MRN: [id number] DOB/AGE: 12-24-1960 62 y.o.  Admit date: 08/04/2022 Discharge date: 08/10/2022  Admission Diagnoses:  Severe aortic stenosis Bicuspid aortic valve Hypertension Hyperlipidemia Expected acute blood loss anemia Anxiety / depression Prediabetes   Discharge Diagnoses:   Severe aortic stenosis Bicuspid aortic valve Hypertension Hyperlipidemia Expected acute blood loss anemia Anxiety / depression Prediabetes S/P AVR (aortic valve replacement)   Consults: None  Procedure (s):   CARDIOVASCULAR SURGERY OPERATIVE NOTE   08/04/2022 Craig Alvarez [id number]   Surgeon:  Craig Pollack, MD   First Assistant: Craig Ginger, MD, [facility] Chief surgical resident: An experienced assistant was required given the complexity of this surgery and the standard of surgical care. The assistant was needed for exposure, dissection, suctioning, retraction of delicate tissues and sutures, instrument exchange and for overall help during this procedure.      Preoperative Diagnosis:  Bicuspid aortic valve with severe aortic stenosis.     Postoperative Diagnosis:  Same     Procedure:   Median Sternotomy Extracorporeal circulation 3.   Aortic valve replacement using a 27 mm Edwards INSPIRIS RESILIA pericardial valve.   Anesthesia:  General Endotracheal     Clinical History/Surgical Indication:   This 62 year old gentleman has stage D, severe, symptomatic aortic stenosis with NYHA class III symptoms of exertional fatigue and shortness of breath consistent with chronic diastolic congestive heart failure.  I agree that aortic valve replacement is indicated in this patient for relief of his symptoms and to prevent progressive left ventricular dysfunction.  Given his relatively young age I think that open surgical aortic valve replacement is  the best option for him.  His aortic root and ascending aorta appear to be of normal size on echocardiogram.  I discussed the alternatives of mechanical and bioprosthetic valves with him.  He does not want to be on Coumadin and would like to use a bioprosthetic valve.  I think that is very reasonable at his age. I discussed the operative procedure with the patient including alternatives, benefits and risks; including but not limited to bleeding, blood transfusion, infection, stroke, myocardial infarction, graft failure, heart block requiring a permanent pacemaker, organ dysfunction, and death.  Craig Alvarez understands and agrees to proceed.          PCP is Craig Alvarez., MD Referring Provider is Craig Osmond, MD   HPI:   The patient is a 62 year old gentleman with history of hyperlipidemia, prediabetes, and bicuspid aortic valve disease that has been followed with periodic echocardiogram.  His most recent echocardiogram on 04/28/2022 showed a bicuspid aortic valve with thickening and calcification.  Mean gradient was 47.2 mmHg with a peak gradient of 68.4 mmHg.  Dimensionless index was 0.26 with an aortic valve area 1.1 cm.  There was mild aortic insufficiency.  Left ventricular ejection fraction was 50 to 55% with moderate LVH and grade 1 diastolic dysfunction.  He was seen in November 2023 with congestive heart failure with lower extremity edema.  He improved with diuretic therapy.  He did not wish to proceed with further workup for surgery at that time due to his job and financial demands.  Cardiac catheterization on 07/13/2022 showed minimal non-obstructive coronary disease.  Right heart pressures are normal.   He reports about a 69-month history  of progressive exertional shortness of breath and fatigue.  He denies any chest pain or pressure.  Has had no dizziness or syncope.  He has had bilateral lower extremity edema for the past several months.  He denies orthopnea and PND.   He reports  seeing his dentist recently and had an orthopantogram and dental cleaning and was told that he had no active dental disease and no contraindication to surgery.   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  He has been there for almost 30 years. He lives in [place].  Following review of the patient and all his studies Dr. Laneta Alvarez recommended aortic valve replacement and he was admitted this hospitalization electively for the procedure.  Hospital Course:  Patient was brought to the operating room and underwent placement of a number 27 mm Inspiris Resilia aortic valve.  He tolerated the procedure well was taken to the surgical intensive care unit in stable condition.  He was weaned from the ventilator and extubated by the afternoon of the day of surgery.  Hemodynamics and cardiac rhythm remained stable.  The monitoring lines were removed on the first postoperative day.  This begun and advanced as tolerated.  Glucose was monitored and sliding scale insulin provided as required.  He has a mild expected acute blood loss anemia which is stabilized.  Renal function is within normal limits.  He did have some expected postoperative volume overload which required a gentle diuresis.  He has responded well and they have been stopped.  Incisions are healing well without evidence of infection.  He is tolerating diet and progressing well in cardiac rehab modalities per protocol.  Oxygen has been weaned and he maintains good saturations on room air.  At the time of discharge the patient is felt to be quite stable.    Latest Vital Signs: Blood pressure 115/71, pulse 99, temperature 98.6 F (37 C), temperature source Oral, resp. rate 18, height 6' (1.829 m), weight 103.9 kg, SpO2 97 %.  Physical Exam: General appearance: alert, cooperative, and no distress Heart: regular rate and rhythm Lungs: clear to auscultation bilaterally Abdomen: mod distension, scant BS Extremities: no  edema Wound: incis healing well  Discharge Condition:  Stable for discharge to home  Recent laboratory studies:  Lab Results  Component Value Date   WBC 12.2 (H) 08/06/2022   HGB 11.0 (L) 08/06/2022   HCT 32.1 (L) 08/06/2022   MCV 97.0 08/06/2022   PLT 124 (L) 08/06/2022   Lab Results  Component Value Date   NA 133 (L) 08/06/2022   K 4.0 08/06/2022   CL 94 (L) 08/06/2022   CO2 30 08/06/2022   CREATININE 1.02 08/06/2022   GLUCOSE 148 (H) 08/06/2022      Diagnostic Studies: DG Chest Port 1 View  Result Date: 08/09/2022 CLINICAL DATA:  Shortness of breath EXAM: PORTABLE CHEST 1 VIEW COMPARISON:  08/06/2022 FINDINGS: Cardiomegaly status post median sternotomy with aortic valve prosthesis. Both lungs are clear. The visualized skeletal structures are unremarkable. IMPRESSION: Cardiomegaly without acute abnormality of the lungs. Electronically Signed   By: Jearld LeschAlex D Bibbey M.D.   On: 08/09/2022 13:36   DG Chest Port 1 View  Result Date: 08/06/2022 CLINICAL DATA:  Evaluate for pneumothorax. EXAM: PORTABLE CHEST 1 VIEW COMPARISON:  Chest x-ray August 05, 2022 FINDINGS: The PA catheter has been removed. A right IJ sheath remains. No pneumothorax. Probable atelectasis in the left base, stable. The heart, hila, mediastinum, lungs, and pleura  are otherwise unchanged. IMPRESSION: The PA catheter has been removed. A right IJ sheath remains. No pneumothorax. Electronically Signed   By: Gerome Sam III M.D.   On: 08/06/2022 09:11   DG Chest Port 1 View  Result Date: 08/05/2022 CLINICAL DATA:  Postop from aortic valve replacement. EXAM: PORTABLE CHEST 1 VIEW COMPARISON:  08/04/2022 FINDINGS: Endotracheal tube and nasogastric tube have been removed. Swan-Ganz catheter and mediastinal drain remain in place. No evidence of pneumothorax. Decreased interstitial prominence consistent with a resolving interstitial edema. Subsegmental atelectasis is seen in the left lower lobe, but shows improvement since  previous study. IMPRESSION: Resolving interstitial edema and left lower lobe atelectasis. No pneumothorax visualized. Electronically Signed   By: Danae Orleans M.D.   On: 08/05/2022 11:05   ECHO INTRAOPERATIVE TEE  Result Date: 08/04/2022  *INTRAOPERATIVE TRANSESOPHAGEAL REPORT *  Patient Name:   Craig Alvarez Date of Exam: 08/04/2022 Medical Rec #:  [id number]    Height:       72.0 in Accession #:    [id number]   Weight:       235.0 lb Date of Birth:  [birth year]     BSA:          2.28 m Patient Age:    61 years     BP:           120/69 mmHg Patient Gender: M            HR:           68 bpm. Exam Location:  Inpatient Transesophogeal exam was perform intraoperatively during surgical procedure. Patient was closely monitored under general anesthesia during the entirety of examination. Indications:     aortic valve replacement Performing Phys: [id number] BRYAN K BARTLE Diagnosing Phys: Earl Lites Stoltzfus Complications: No known complications during this procedure. POST-OP IMPRESSIONS _ Left Ventricle: The left ventricle is unchanged from pre-bypass. _ Right Ventricle: The right ventricle appears unchanged from pre-bypass. _ Aorta: The aorta appears unchanged from pre-bypass. _ Aortic Valve: A bioprosthetic valve was placed, leaflets are freely mobile Size; 34mm. No regurgitation post replacement. The gradient recorded across the prosthetic valve is within the expected range. No perivalvular leak noted.mean PG . _ Mitral Valve: There is mild regurgitation. _ Tricuspid Valve: The tricuspid valve appears unchanged from pre-bypass. _ Pulmonic Valve: The pulmonic valve appears unchanged from pre-bypass. _ Interatrial Septum: The interatrial septum appears unchanged from pre-bypass. _ Pericardium: The pericardium appears unchanged from pre-bypass. PRE-OP FINDINGS  Left Ventricle: The left ventricle has normal systolic function, with an ejection fraction of 55-60%. The cavity size was normal. No evidence of left ventricular  regional wall motion abnormalities. There is moderate concentric left ventricular hypertrophy. Left ventricular diastolic parameters were normal. Right Ventricle: The right ventricle has normal systolic function. The cavity was normal. There is no increase in right ventricular wall thickness. Right ventricular systolic pressure is normal. Left Atrium: Left atrial size was normal in size. No left atrial/left atrial appendage thrombus was detected. Left atrial appendage velocity is normal at greater than 40 cm/s. Right Atrium: Right atrial size was normal in size. Prominent Eustachian valve. Interatrial Septum: No atrial level shunt detected by color flow Doppler. The interatrial septum appears to be lipomatous. There is no evidence of a patent foramen ovale. Pericardium: There is no evidence of pericardial effusion. There is no pleural effusion. Mitral Valve: The mitral valve is normal in structure. Mitral valve regurgitation mild to moderate. The MR jet is centrally-directed. There is no evidence  of mitral valve vegetation. Pulmonary venous flow is blunted (decreased). There is mild mitral valve regurgitation by PISA measuring 2.26. There is No evidence of mitral stenosis. A2/P2 poor coaptation. PISA radius .6cm, EROA .12cm2, RV 32cc. Tricuspid Valve: The tricuspid valve was normal in structure. Tricuspid valve regurgitation is mild by color flow Doppler. No evidence of tricuspid stenosis is present. There is no evidence of tricuspid valve vegetation. 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 The jet is posteriorly-directed. 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 There is no evidence of aortic valve vegetation. There is moderate thickening and severe calcifcation present on the aortic valve left coronary and right coronary cusps with severely decreased mobility. Pulmonic Valve: The pulmonic valve was normal in structure,  with normal. No evidence of pumonic stenosis. Pulmonic valve regurgitation is trivial by color flow Doppler. Aorta: The aortic root and ascending aorta are normal in size and structure. Pulmonary Artery: Theone MurdochSwan Ganz catheter present on the right. The pulmonary artery is of normal size. Shunts: There is a small secundum atrial septal defect with predominantly left to right shunting across the atrial septum. +--------------+--------++ LEFT VENTRICLE          +----------------+---------++ +--------------+--------++  Diastology                PLAX 2D                 +----------------+---------++ +--------------+--------++  LV e' lateral:  7.82 cm/s LV PW:        1.40 cm   +----------------+---------++ +--------------+--------++  LV E/e' lateral:10.1      LV IVS:       1.70 cm   +----------------+---------++ +--------------+--------++  LV e' medial:   7.20 cm/s LVOT diam:    2.70 cm   +----------------+---------++ +--------------+--------++  LV E/e' medial: 11.0      LVOT Area:    5.73 cm  +----------------+---------++ +--------------+--------++                         +----------------------+-------++ +--------------+--------++  2D Longitudinal Strain                                    +----------------------+-------++                             2D Strain GLS (A2C):  -13.9 %                             +----------------------+-------++                             2D Strain GLS (A3C):  -11.1 %                             +----------------------+-------++                             2D Strain GLS (A4C):  -9.9 %                              +----------------------+-------++  2D Strain GLS Avg:    -11.6 %                             +----------------------+-------++                              +------------+---------++                             3D Volume EF                                      +------------+---------++                              LV 3D EDV:  126.46 ml                             +------------+---------++                             LV 3D ESV:  54.22 ml                              +------------+---------++ +---------------+------+-------+ RIGHT VENTRICLE              +---------------+------+-------+ TAPSE (M-mode):1.6 cm2.37 cm +---------------+------+-------+ +------------------+------------++ AORTIC VALVE                   +------------------+------------++ AV Area (Vmax):   1.28 cm     +------------------+------------++ AV Area (Vmean):  1.29 cm     +------------------+------------++ AV Area (VTI):    1.48 cm     +------------------+------------++ AV Vmax:          390.00 cm/s  +------------------+------------++ AV Vmean:         267.500 cm/s +------------------+------------++ AV VTI:           1.075 m      +------------------+------------++ AV Peak Grad:     60.8 mmHg    +------------------+------------++ AV Mean Grad:     33.5 mmHg    +------------------+------------++ LVOT Vmax:        86.90 cm/s   +------------------+------------++ LVOT Vmean:       60.500 cm/s  +------------------+------------++ LVOT VTI:         0.278 m      +------------------+------------++ LVOT/AV VTI ratio:0.26         +------------------+------------++ AR PHT:           485 msec     +------------------+------------++  +--------------+-------++ AORTA                 +--------------+-------++ Ao Sinus diam:4.10 cm +--------------+-------++ Ao STJ diam:  2.9 cm  +--------------+-------++ Ao Asc diam:  3.70 cm +--------------+-------++ +--------------+----------++ MITRAL VALVE                 +--------------+-------+ +--------------+----------++     SHUNTS                MV Area (PHT):3.33 cm       +--------------+-------+ +--------------+----------++     Systemic VTI: 0.28 m  MV Peak grad: 3.1  mmHg       +--------------+-------+  +--------------+----------++     Systemic Diam:2.70 cm MV Mean grad: 1.0 mmHg       +--------------+-------+ +--------------+----------++ MV Vmax:      0.87 m/s   +--------------+----------++ MV Vmean:     51.0 cm/s  +--------------+----------++ MV VTI:       0.21 m     +--------------+----------++ MV PHT:       66.12 msec +--------------+----------++ MV Decel Time:228 msec   +--------------+----------++ +----------------+-----------++ MR Peak grad:   206.2 mmHg  +----------------+-----------++ MR Mean grad:   127.0 mmHg  +----------------+-----------++ MR Vmax:        718.00 cm/s +----------------+-----------++ MR Vmean:       523.0 cm/s  +----------------+-----------++ MR PISA:        2.26 cm    +----------------+-----------++ MR PISA Eff ROA:12 mm      +----------------+-----------++ MR PISA Radius: 0.60 cm     +----------------+-----------++ +--------------+----------++ MV E velocity:79.00 cm/s +--------------+----------++ MV A velocity:85.10 cm/s +--------------+----------++ MV E/A ratio: 0.93       +--------------+----------++  Rochele Pages Electronically signed by Rochele Pages Signature Date/Time: 08/04/2022/8:49:58 PM    Final    DG Chest 2 View  Result Date: 08/04/2022 CLINICAL DATA:  [id number] Pre-op chest exam [id number] EXAM: CHEST - 2 VIEW COMPARISON:  April 25, 2020 FINDINGS: The cardiomediastinal silhouette is unchanged in contour. No pleural effusion. No pneumothorax. No acute pleuroparenchymal abnormality. Visualized abdomen is unremarkable. Multilevel degenerative changes of the thoracic spine. Remote LEFT-sided rib fractures. IMPRESSION: No acute cardiopulmonary abnormality. Electronically Signed   By: Valentino Saxon M.D.   On: 08/04/2022 17:32   DG Chest Port 1 View  Result Date: 08/04/2022 CLINICAL DATA:  Status post aortic valve replacement. EXAM: PORTABLE CHEST 1 VIEW COMPARISON:  Chest radiograph 08/02/2022.  FINDINGS: Interval postoperative changes of median sternotomy and aortic valve replacement. Right IJ approach pulmonary artery catheter projects over the right main pulmonary artery. Endotracheal tube tip projects over the midthoracic trachea. Enteric tube side port projects over the stomach. Two mediastinal chest tubes in place. Low lung volumes accentuate the pulmonary vasculature and cardiomediastinal silhouette. Superimposed moderate pulmonary edema with small left pleural effusion. Expected postoperative enlargement of the cardiomediastinal silhouette. Old rib fracture deformities of the left posterior chest wall. IMPRESSION: 1. Expected postoperative changes of median sternotomy aortic valve replacement. Moderate pulmonary edema with small left pleural effusion. 2. Support apparatus in good position, as above. Electronically Signed   By: Emmit Alexanders M.D.   On: 08/04/2022 13:37   EP STUDY  Result Date: 08/04/2022 See surgical note for result.  VAS US DOPPLER PRE CABG  Result Date: 08/02/2022 PREOPERATIVE VASCULAR EVALUATION Patient Name:  DEQUON SCHNEBLY  Date of Exam:   08/02/2022 Medical Rec #: [id number]     Accession #:    [id number] Date of Birth: 1960-08-10      Patient Gender: M Patient Age:   56 years Exam Location:  [facility] Procedure:      VAS US DOPPLER PRE CABG Referring Phys: Gilford Raid --------------------------------------------------------------------------------  Indications:  Aorta Valve Replacement. Risk Factors: Hypertension, hyperlipidemia, coronary artery disease. Performing Technologist: Oda Cogan RDMS, RVT  Examination Guidelines: A complete evaluation includes B-mode imaging, spectral Doppler, color Doppler, and power Doppler as needed of all accessible portions of each vessel. Bilateral testing is considered an integral part of a complete examination. Limited examinations for reoccurring indications may be performed as noted.  Right Carotid Findings:  +----------+--------+--------+--------+----------------------+--------+  PSV cm/sEDV cm/sStenosisDescribe              Comments +----------+--------+--------+--------+----------------------+--------+ CCA Prox  88      22                                             +----------+--------+--------+--------+----------------------+--------+ CCA Distal77      27                                             +----------+--------+--------+--------+----------------------+--------+ ICA Prox  118     35      1-39%   irregular and calcific         +----------+--------+--------+--------+----------------------+--------+ ICA Mid   120     40                                             +----------+--------+--------+--------+----------------------+--------+ ICA Distal87      29                                             +----------+--------+--------+--------+----------------------+--------+ ECA       90      18                                             +----------+--------+--------+--------+----------------------+--------+ +----------+--------+-------+----------------+------------+           PSV cm/sEDV cmsDescribe        Arm Pressure +----------+--------+-------+----------------+------------+ Subclavian127            Multiphasic, WNL             +----------+--------+-------+----------------+------------+ +---------+--------+--+--------+--+---------+ VertebralPSV cm/s38EDV cm/s14Antegrade +---------+--------+--+--------+--+---------+ Left Carotid Findings: +----------+--------+--------+--------+--------+--------+           PSV cm/sEDV cm/sStenosisDescribeComments +----------+--------+--------+--------+--------+--------+ CCA Prox  110     26                               +----------+--------+--------+--------+--------+--------+ CCA Distal90      27                               +----------+--------+--------+--------+--------+--------+  ICA Prox  83      35      1-39%                    +----------+--------+--------+--------+--------+--------+ ICA Mid   88      36                               +----------+--------+--------+--------+--------+--------+ ICA Distal70      26                               +----------+--------+--------+--------+--------+--------+ ECA  70      13                               +----------+--------+--------+--------+--------+--------+ +----------+--------+--------+----------------+------------+ SubclavianPSV cm/sEDV cm/sDescribe        Arm Pressure +----------+--------+--------+----------------+------------+           89              Multiphasic, WNL             +----------+--------+--------+----------------+------------+ +---------+--------+--+--------+--+---------+ VertebralPSV cm/s24EDV cm/s11Antegrade +---------+--------+--+--------+--+---------+  ABI Findings: +--------+------------------+-----+---------+--------+ Right   Rt Pressure (mmHg)IndexWaveform Comment  +--------+------------------+-----+---------+--------+ Brachial120                    triphasic         +--------+------------------+-----+---------+--------+ +--------+------------------+-----+---------+-------+ Left    Lt Pressure (mmHg)IndexWaveform Comment +--------+------------------+-----+---------+-------+ [id number]                    triphasic        +--------+------------------+-----+---------+-------+  Right Doppler Findings: +--------+--------+-----+---------+--------+ Site    PressureIndexDoppler  Comments +--------+--------+-----+---------+--------+ [id number]          triphasic         +--------+--------+-----+---------+--------+ Radial               biphasic          +--------+--------+-----+---------+--------+ Ulnar                triphasic         +--------+--------+-----+---------+--------+  Left Doppler Findings:  +--------+--------+-----+---------+--------+ Site    PressureIndexDoppler  Comments +--------+--------+-----+---------+--------+ [id number]          triphasic         +--------+--------+-----+---------+--------+ Radial               triphasic         +--------+--------+-----+---------+--------+ Ulnar                triphasic         +--------+--------+-----+---------+--------+   Summary: Right Carotid: Velocities in the right ICA are consistent with a 1-39% stenosis.                Velocities suggest upper end of scale. Left Carotid: Velocities in the left ICA are consistent with a 1-39% stenosis. Vertebrals:  Bilateral vertebral arteries demonstrate antegrade flow. Subclavians: Normal flow hemodynamics were seen in bilateral subclavian              arteries. Right Upper Extremity: Doppler waveforms remain within normal limits with right radial compression. Doppler waveforms decrease <50% with right ulnar compression. Left Upper Extremity: Doppler waveforms remain within normal limits with left radial compression. Doppler waveforms remain within normal limits with left ulnar compression.  Electronically signed by Harold Barban MD on 08/02/2022 at 8:57:41 PM.    Final    CARDIAC CATHETERIZATION  Result Date: 07/13/2022   Mid LM lesion is 20% stenosed. 1.  Minimal obstructive coronary artery disease. 2.  Fick cardiac output of 8.5 L/min and Fick cardiac index of 3.7 L/min/m with mean RA pressure of 9 mmHg, mean wedge pressure of 15 mmHg, and mean PA pressure of 23 mmHg. Recommendation: Cardiothoracic surgical evaluation.       Discharge Instructions     Amb Referral to Cardiac Rehabilitation   Complete by: As directed    Diagnosis: Valve Replacement   Valve: Aortic   After initial evaluation and assessments completed: Virtual Based Care may be provided alone  or in conjunction with Phase 2 Cardiac Rehab based on patient barriers.: Yes   Intensive Cardiac Rehabilitation (ICR) [facility]  location only OR Traditional Cardiac Rehabilitation (TCR) *If criteria for ICR are not met will enroll in TCR [facility] only): Yes       Discharge Medications: Allergies as of 08/10/2022   No Known Allergies      Medication List     STOP taking these medications    amLODipine 5 MG tablet Commonly known as: NORVASC   chlorthalidone 25 MG tablet Commonly known as: HYGROTON   furosemide 20 MG tablet Commonly known as: LASIX       TAKE these medications    ALPRAZolam 1 MG tablet Commonly known as: XANAX Take 1 mg by mouth at bedtime as needed for sleep.   amoxicillin 500 MG tablet Commonly known as: AMOXIL Take 2,000 mg by mouth See admin instructions. Take 2000 mg by mouth1 hour before dental procedures   aspirin 81 MG chewable tablet Chew 81 mg by mouth in the morning.   atorvastatin 20 MG tablet Commonly known as: LIPITOR Take 20 mg by mouth every evening.   CALCIUM-VITAMIN D PO Take 1 tablet by mouth in the morning.   ezetimibe 10 MG tablet Commonly known as: ZETIA Take 1 tablet (10 mg total) by mouth daily.   ibuprofen 200 MG tablet Commonly known as: ADVIL Take 400 mg by mouth every 8 (eight) hours as needed for moderate pain.   lisinopril 20 MG tablet Commonly known as: ZESTRIL Take 20 mg by mouth in the morning.   loratadine 10 MG tablet Commonly known as: CLARITIN Take 10 mg by mouth daily as needed for allergies.   metoprolol tartrate 50 MG tablet Commonly known as: LOPRESSOR Take 1 tablet (50 mg total) by mouth 2 (two) times daily.   multivitamin tablet Take 1 tablet by mouth in the morning.   Omega-3 1000 MG Caps Take 1,000 mg by mouth in the morning.   OVER THE COUNTER MEDICATION Take 2 capsules by mouth at bedtime. Relaxium   rOPINIRole 0.5 MG tablet Commonly known as: REQUIP Take 0.5 mg by mouth at bedtime.   sildenafil 20 MG tablet Commonly known as: REVATIO Take 60-80 mg by mouth daily as needed (erectile dysfunction).    traMADol 50 MG tablet Commonly known as: ULTRAM Take 1 tablet (50 mg total) by mouth every 6 (six) hours as needed for up to 7 days for moderate pain.        Follow Up Appointments:  Follow-up Information     Baldo Daub, MD. Go on 08/18/2022.   Specialties: Cardiology, Radiology Why: Your appt is at [facility] information: [address] [place] [address] [phone]         Alleen Borne, MD. Go on 09/06/2022.   Specialty: Cardiothoracic Surgery Why: Your appointment is at 1pm.  Also obtain a chest x-ray AT [facility] 1-hour prior to the appointment.  It is located at [address] Contact information: [address] [place] [address] [phone]         [facility]. Go on 08/14/2022.   Specialty: Cardiothoracic Surgery Why: Your appointment for suture removal is at 11:30am. Contact information: [address] [place], [address] [place] [address] [phone]                Signed: Dale  08/10/2022, 9:00 AM

## 2022-08-08 NOTE — Progress Notes (Signed)
4 Days Post-Op Procedure(s) (LRB): AORTIC VALVE REPLACEMENT (AVR) USING EDWARDS 27 MM INSPIRIS RESILIA AORTIC VALVE (N/A) TRANSESOPHAGEAL ECHOCARDIOGRAM (TEE) (N/A) Subjective: Feels better. Nausea resolved. Eating some breakfast. Passing flatus but no BM yet.  Objective: Vital signs in last 24 hours: Temp:  [97.9 F (36.6 C)-99.2 F (37.3 C)] 98.6 F (37 C) (01/30 0400) Pulse Rate:  [90-104] 102 (01/30 0600) Cardiac Rhythm: Sinus tachycardia;Normal sinus rhythm (01/30 0400) Resp:  [13-22] 18 (01/30 0600) BP: (107-153)/(64-126) 132/77 (01/30 0600) SpO2:  [89 %-96 %] 93 % (01/30 0600) Weight:  [104 kg] 104 kg (01/30 0500)  Hemodynamic parameters for last 24 hours:    Intake/Output from previous day: 01/29 0701 - 01/30 0700 In: 250 [P.O.:240; I.V.:10] Out: 2550 [Urine:2550] Intake/Output this shift: Total I/O In: 250 [P.O.:240; I.V.:10] Out: 1100 [Urine:1100]  General appearance: alert and cooperative Neurologic: intact Heart: regular rate and rhythm, S1, S2 normal, no murmur Lungs: clear to auscultation bilaterally Abdomen: soft, protuberant, non-tender; bowel sounds normal Extremities: no edema Wound: incision looks good  Lab Results: Recent Labs    08/05/22 1711 08/06/22 0337  WBC 12.7* 12.2*  HGB 11.9* 11.0*  HCT 34.5* 32.1*  PLT 147* 124*   BMET:  Recent Labs    08/05/22 1711 08/06/22 0337  NA 132* 133*  K 4.0 4.0  CL 94* 94*  CO2 29 30  GLUCOSE 164* 148*  BUN 23 25*  CREATININE 1.20 1.02  CALCIUM 8.1* 8.4*    PT/INR: No results for input(s): "LABPROT", "INR" in the last 72 hours. ABG    Component Value Date/Time   PHART 7.368 08/04/2022 1750   HCO3 23.1 08/04/2022 1750   TCO2 24 08/04/2022 1750   ACIDBASEDEF 2.0 08/04/2022 1750   O2SAT 97 08/04/2022 1750   CBG (last 3)  Recent Labs    08/06/22 2332 08/07/22 0420 08/07/22 0823  GLUCAP 143* 137* 121*    Assessment/Plan: S/P Procedure(s) (LRB): AORTIC VALVE REPLACEMENT (AVR) USING  EDWARDS 27 MM INSPIRIS RESILIA AORTIC VALVE (N/A) TRANSESOPHAGEAL ECHOCARDIOGRAM (TEE) (N/A)  POD 4 Hemodynamically stable in sinus rhythm. Continue Lopressor.  Wt is below preop. Will stop diuretic and KCL.  Laxative this am.  Awaiting bed on 4E. Continue IS, ambulation.  If no changes plan home tomorrow with his sister.   LOS: 4 days    Gaye Pollack 08/08/2022

## 2022-08-09 ENCOUNTER — Inpatient Hospital Stay (HOSPITAL_COMMUNITY): Payer: 59

## 2022-08-09 MED ORDER — GUAIFENESIN-DM 100-10 MG/5ML PO SYRP: 5.0000 mL | ORAL_SOLUTION | ORAL | Status: DC | PRN

## 2022-08-09 MED ORDER — LEVALBUTEROL HCL 0.63 MG/3ML IN NEBU
0.6300 mg | INHALATION_SOLUTION | Freq: Two times a day (BID) | RESPIRATORY_TRACT | Status: DC
  Administered 2022-08-09: 0.63 mg via RESPIRATORY_TRACT
  Filled 2022-08-09 (×2): qty 3

## 2022-08-09 MED ORDER — LACTULOSE 10 GM/15ML PO SOLN: 30.0000 g | Freq: Every day | ORAL | Status: DC | PRN

## 2022-08-09 MED ORDER — MAGNESIUM CITRATE PO SOLN
1.0000 | Freq: Once | ORAL | Status: AC
  Administered 2022-08-09: 1 via ORAL
  Filled 2022-08-09: qty 296

## 2022-08-09 MED ORDER — METOPROLOL TARTRATE 25 MG/10 ML ORAL SUSPENSION
25.0000 mg | Freq: Two times a day (BID) | ORAL | Status: DC
  Filled 2022-08-09 (×3): qty 10

## 2022-08-09 MED ORDER — LEVALBUTEROL HCL 0.63 MG/3ML IN NEBU: 0.6300 mg | INHALATION_SOLUTION | Freq: Four times a day (QID) | RESPIRATORY_TRACT | Status: DC | PRN

## 2022-08-09 MED ORDER — METOPROLOL TARTRATE 50 MG PO TABS
50.0000 mg | ORAL_TABLET | Freq: Two times a day (BID) | ORAL | Status: DC
  Administered 2022-08-09 – 2022-08-10 (×3): 50 mg via ORAL
  Filled 2022-08-09 (×3): qty 1

## 2022-08-09 MED ORDER — LEVALBUTEROL HCL 0.63 MG/3ML IN NEBU
0.6300 mg | INHALATION_SOLUTION | Freq: Three times a day (TID) | RESPIRATORY_TRACT | Status: DC
  Administered 2022-08-09 (×2): 0.63 mg via RESPIRATORY_TRACT
  Filled 2022-08-09 (×2): qty 3

## 2022-08-09 MED ORDER — FUROSEMIDE 10 MG/ML IJ SOLN
40.0000 mg | Freq: Once | INTRAMUSCULAR | Status: AC
  Administered 2022-08-09: 40 mg via INTRAVENOUS
  Filled 2022-08-09: qty 4

## 2022-08-09 MED ORDER — POTASSIUM CHLORIDE CRYS ER 20 MEQ PO TBCR
20.0000 meq | EXTENDED_RELEASE_TABLET | Freq: Once | ORAL | Status: AC
  Administered 2022-08-09: 20 meq via ORAL
  Filled 2022-08-09: qty 1

## 2022-08-09 MED ORDER — AMLODIPINE BESYLATE 5 MG PO TABS: 5.0000 mg | ORAL_TABLET | Freq: Every morning | ORAL | Status: DC

## 2022-08-09 MED ORDER — LISINOPRIL 20 MG PO TABS
20.0000 mg | ORAL_TABLET | Freq: Every morning | ORAL | Status: DC
  Administered 2022-08-09 – 2022-08-10 (×2): 20 mg via ORAL
  Filled 2022-08-09 (×2): qty 1

## 2022-08-09 NOTE — Progress Notes (Signed)
Mobility Specialist Progress Note:   08/09/22 1547  Mobility  Activity Ambulated with assistance in room  Level of Assistance Independent  Assistive Device None  Distance Ambulated (ft) 100 ft  Activity Response Tolerated well  $Mobility charge 1 Mobility   Pt up in room. No complaints of pain. Left in chair with call bell in reach and all needs met.   Gareth Eagle Ashliegh Parekh Mobility Specialist Please contact via [employer] or  Rehab Office at [phone]

## 2022-08-09 NOTE — Progress Notes (Signed)
CARDIAC REHAB PHASE I   Pt resting in bed, feeling poorly and frustrated by new onset of chest congestion and no bowel movements. States I am so tired from all that coughing and no sleep. Offered to walk in hall. Pt refused ambulation. Discussed pt frustrations regarding chest congestion and no bowel movements. Educated on importance of ambulation and frequent IS use. Pt has not walked in hall today. Encouraged to ambulate later today. Will continue to follow.   1330-1400  Vanessa Barbara, RN BSN 08/09/2022 2:01 PM

## 2022-08-09 NOTE — Progress Notes (Addendum)
[address]       [place] [address]             [phone]     5 Days Post-Op Procedure(s) (LRB): AORTIC VALVE REPLACEMENT (AVR) USING EDWARDS 27 MM INSPIRIS RESILIA AORTIC VALVE (N/A) TRANSESOPHAGEAL ECHOCARDIOGRAM (TEE) (N/A) Subjective: Productive cough  Objective: Vital signs in last 24 hours: Temp:  [97 F (36.1 C)-98.8 F (37.1 C)] 98.8 F (37.1 C) (01/31 0748) Pulse Rate:  [98-108] 105 (01/31 0748) Cardiac Rhythm: Sinus tachycardia;Bundle branch block (01/30 2230) Resp:  [16-22] 20 (01/31 0748) BP: (118-164)/(73-99) 139/77 (01/31 0748) SpO2:  [81 %-97 %] 92 % (01/31 0748) Weight:  [103.1 kg] 103.1 kg (01/31 0342)  Hemodynamic parameters for last 24 hours:    Intake/Output from previous day: 01/30 0701 - 01/31 0700 In: 370 [P.O.:360; I.V.:10] Out: -  Intake/Output this shift: Total I/O In: 240 [P.O.:240] Out: -   General appearance: alert, cooperative, and no distress Heart: regular rate and rhythm and occas irreg beat , soft syst murmur Lungs: coarse with scattered ronchi.and crackles Abdomen: mod distension Extremities: min edema Wound: incis healing well  Lab Results: No results for input(s): "WBC", "HGB", "HCT", "PLT" in the last 72 hours. BMET: No results for input(s): "NA", "K", "CL", "CO2", "GLUCOSE", "BUN", "CREATININE", "CALCIUM" in the last 72 hours.  PT/INR: No results for input(s): "LABPROT", "INR" in the last 72 hours. ABG    Component Value Date/Time   PHART 7.368 08/04/2022 1750   HCO3 23.1 08/04/2022 1750   TCO2 24 08/04/2022 1750   ACIDBASEDEF 2.0 08/04/2022 1750   O2SAT 97 08/04/2022 1750   CBG (last 3)  Recent Labs    08/06/22 2332 08/07/22 0420 08/07/22 0823  GLUCAP 143* 137* 121*    Meds Scheduled Meds:  acetaminophen  1,000 mg Oral Q6H   Or   acetaminophen (TYLENOL) oral liquid 160 mg/5 mL  1,000 mg Per Tube Q6H   aspirin EC  325 mg Oral Daily   Or   aspirin  324 mg Per Tube Daily    atorvastatin  20 mg Oral QPM   bisacodyl  10 mg Oral Daily   Or   bisacodyl  10 mg Rectal Daily   Chlorhexidine Gluconate Cloth  6 each Topical Daily   docusate sodium  200 mg Oral Daily   ezetimibe  10 mg Oral Daily   metoprolol tartrate  25 mg Oral BID   Or   metoprolol tartrate  25 mg Per Tube BID   pantoprazole  40 mg Oral Daily   rOPINIRole  0.5 mg Oral QHS   sodium chloride flush  3 mL Intravenous Q12H   Continuous Infusions:  sodium chloride     sodium chloride     sodium chloride Stopped (08/06/22 0541)   lactated ringers     lactated ringers Stopped (08/05/22 1333)   promethazine (PHENERGAN) injection (IM or IVPB) Stopped (08/06/22 0839)   PRN Meds:.sodium chloride, ALPRAZolam, metoprolol tartrate, ondansetron (ZOFRAN) IV, oxyCODONE, promethazine (PHENERGAN) injection (IM or IVPB), sodium chloride flush, traMADol  Xrays No results found.  Assessment/Plan: S/P Procedure(s) (LRB): AORTIC VALVE REPLACEMENT (AVR) USING EDWARDS 27 MM INSPIRIS RESILIA AORTIC VALVE (N/A) TRANSESOPHAGEAL ECHOCARDIOGRAM (TEE) (N/A) POD#5 1 afeb, S BP 118-164,mostly elevated SR/ST , cont beta blocker, will increase dose of metoprolol to 50 bid,  will resume lisinopril 2 sats good on RA, significant pulm congestion, will add xopenex short term 3 voiding, UOP not measured, weight below preop,  on no diuretics 4 BS well controlled, no DM meds preop, A1c 5.9 5 no new labs or CXR's 6 mod distension + having flatus but needs to have BM prior to d/c, will add lactulose 7 push rehab and pulm hygiene     LOS: 5 days    John Giovanni PA-C Pager [phone] 08/09/2022   Chart reviewed, patient examined, agree with above. Still no BM today. Has lactulose ordered prn but has not received any today. Will given him Mag citrate this pm.

## 2022-08-10 MED ORDER — LACTULOSE 10 GM/15ML PO SOLN: 30.0000 g | Freq: Once | ORAL | Status: DC

## 2022-08-10 MED ORDER — METOPROLOL TARTRATE 50 MG PO TABS: 50.0000 mg | ORAL_TABLET | Freq: Two times a day (BID) | ORAL | 5 refills | Status: DC

## 2022-08-10 MED ORDER — TRAMADOL HCL 50 MG PO TABS: 50.0000 mg | ORAL_TABLET | Freq: Four times a day (QID) | ORAL | 0 refills | Status: AC | PRN

## 2022-08-10 NOTE — Plan of Care (Signed)
DISCHARGE NOTE HOME Craig Alvarez to be discharged home per MD order. Discussed prescriptions and follow up appointments with the patient. Medication list explained in detail. Patient verbalized understanding.  Skin clean, dry and intact without evidence of skin break down, no evidence of skin tears noted. IV catheter discontinued intact. Site without signs and symptoms of complications. Dressing and pressure applied. Pt denies pain at the site currently. No complaints noted.  Patient free of lines, drains, and wounds.   An After Visit Summary (AVS) was printed and given to the patient. Patient escorted to discharge lounge via wheelchair, and discharged home via private auto.  Stephan Minister, RN

## 2022-08-10 NOTE — Progress Notes (Addendum)
[address]       [place] [address]             [phone]     6 Days Post-Op Procedure(s) (LRB): AORTIC VALVE REPLACEMENT (AVR) USING EDWARDS 27 MM INSPIRIS RESILIA AORTIC VALVE (N/A) TRANSESOPHAGEAL ECHOCARDIOGRAM (TEE) (N/A) Subjective: Feels much better but no BM yet  Objective: Vital signs in last 24 hours: Temp:  [97.7 F (36.5 C)-98.8 F (37.1 C)] 97.7 F (36.5 C) (01/31 2011) Pulse Rate:  [88-114] 103 (01/31 2048) Cardiac Rhythm: Sinus tachycardia;Supraventricular tachycardia (01/31 2027) Resp:  [15-21] 19 (01/31 2048) BP: (115-139)/(66-80) 134/80 (01/31 2011) SpO2:  [92 %-100 %] 100 % (01/31 2048) Weight:  [103.9 kg] 103.9 kg (02/01 0602)  Hemodynamic parameters for last 24 hours:    Intake/Output from previous day: 01/31 0701 - 02/01 0700 In: 720 [P.O.:720] Out: 1550 [Urine:1550] Intake/Output this shift: No intake/output data recorded.  General appearance: alert, cooperative, and no distress Heart: regular rate and rhythm Lungs: clear to auscultation bilaterally Abdomen: mod distension, scant BS Extremities: no edema Wound: incis healing well  Lab Results: No results for input(s): "WBC", "HGB", "HCT", "PLT" in the last 72 hours. BMET: No results for input(s): "NA", "K", "CL", "CO2", "GLUCOSE", "BUN", "CREATININE", "CALCIUM" in the last 72 hours.  PT/INR: No results for input(s): "LABPROT", "INR" in the last 72 hours. ABG    Component Value Date/Time   PHART 7.368 08/04/2022 1750   HCO3 23.1 08/04/2022 1750   TCO2 24 08/04/2022 1750   ACIDBASEDEF 2.0 08/04/2022 1750   O2SAT 97 08/04/2022 1750   CBG (last 3)  Recent Labs    08/07/22 0823  GLUCAP 121*    Meds Scheduled Meds:  aspirin EC  325 mg Oral Daily   Or   aspirin  324 mg Per Tube Daily   atorvastatin  20 mg Oral QPM   bisacodyl  10 mg Oral Daily   Or   bisacodyl  10 mg Rectal Daily   Chlorhexidine Gluconate Cloth  6 each Topical Daily   docusate sodium   200 mg Oral Daily   ezetimibe  10 mg Oral Daily   levalbuterol  0.63 mg Nebulization BID   lisinopril  20 mg Oral q AM   metoprolol tartrate  50 mg Oral BID   Or   metoprolol tartrate  25 mg Per Tube BID   pantoprazole  40 mg Oral Daily   rOPINIRole  0.5 mg Oral QHS   sodium chloride flush  3 mL Intravenous Q12H   Continuous Infusions:  sodium chloride     sodium chloride     sodium chloride Stopped (08/06/22 0541)   lactated ringers     lactated ringers Stopped (08/05/22 1333)   promethazine (PHENERGAN) injection (IM or IVPB) Stopped (08/06/22 0839)   PRN Meds:.sodium chloride, ALPRAZolam, guaiFENesin-dextromethorphan, lactulose, levalbuterol, metoprolol tartrate, ondansetron (ZOFRAN) IV, oxyCODONE, promethazine (PHENERGAN) injection (IM or IVPB), sodium chloride flush, traMADol  Xrays DG Chest Port 1 View  Result Date: 08/09/2022 CLINICAL DATA:  Shortness of breath EXAM: PORTABLE CHEST 1 VIEW COMPARISON:  08/06/2022 FINDINGS: Cardiomegaly status post median sternotomy with aortic valve prosthesis. Both lungs are clear. The visualized skeletal structures are unremarkable. IMPRESSION: Cardiomegaly without acute abnormality of the lungs. Electronically Signed   By: Delanna Ahmadi M.D.   On: 08/09/2022 13:36    Assessment/Plan: S/P Procedure(s) (LRB): AORTIC VALVE REPLACEMENT (AVR) USING EDWARDS 27 MM INSPIRIS RESILIA AORTIC VALVE (N/A) TRANSESOPHAGEAL ECHOCARDIOGRAM (TEE) (N/A) POD#6  1 afeb, VSS s BP 115-139,improved from yesterday,  sinus rhythm/tachy, short burst of SVT 2 sats good on RA 3 good UOP 4 no new labs 5 CXR yesterday, cardiomegally without acute pulm findings 6 got mag citrate with no response, may need to consider abd xray , no previous abdominal surgery so likely just constpation. Limit narcotics, ambulate  7 pulm status much improved c/w yesterday       LOS: 6 days    John Giovanni PA-C Pager [phone] 08/10/2022   Chart reviewed, patient examined,  agree with above. He just had a BM and wants to go home. Otherwise looks great so will send home today.

## 2022-08-10 NOTE — Progress Notes (Signed)
Mobility Specialist Progress Note:   08/10/22 0904  Mobility  Activity Ambulated with assistance in hallway  Level of Assistance Independent after set-up  Assistive Device None  Distance Ambulated (ft) 550 ft  Activity Response Tolerated well  $Mobility charge 1 Mobility   During Mobility: 113 HR Post Mobility:  101 HR  Pt EOB willing to participate in mobility. No complaints of pain. Left EOB with call bell in reach and all needs met.   Gareth Eagle Lili Harts Mobility Specialist Please contact via [employer] or  Rehab Office at [phone]

## 2022-08-10 NOTE — Progress Notes (Signed)
CARDIAC REHAB PHASE I    Pt sitting side of bed. Feeling much better today and ready to go home. Reviewed home education. Pt is moving around well maintaining proper sternal precautions. Referral sent to [facility] for [place]     Vanessa Barbara, RN BSN 08/10/2022 9:42 AM

## 2022-08-10 NOTE — Discharge Instructions (Signed)

## 2022-08-11 ENCOUNTER — Ambulatory Visit: Payer: 59 | Admitting: Cardiology

## 2022-08-11 LAB — TYPE AND SCREEN
ABO/RH(D): O POS
Antibody Screen: NEGATIVE
Unit division: 0
Unit division: 0

## 2022-08-11 LAB — BPAM RBC
Blood Product Expiration Date: 202403012359
Blood Product Expiration Date: 202403012359
ISSUE DATE / TIME: 202401240807
Unit Type and Rh: 5100
Unit Type and Rh: 5100

## 2022-08-11 MED FILL — Heparin Sodium (Porcine) Inj 1000 Unit/ML: Qty: 1000 | Status: AC

## 2022-08-11 MED FILL — Potassium Chloride Inj 2 mEq/ML: INTRAVENOUS | Qty: 40 | Status: AC

## 2022-08-11 MED FILL — Lidocaine HCl Local Preservative Free (PF) Inj 2%: INTRAMUSCULAR | Qty: 14 | Status: AC

## 2022-08-14 ENCOUNTER — Ambulatory Visit: Payer: Self-pay | Admitting: *Deleted

## 2022-08-14 DIAGNOSIS — Z4802 Encounter for removal of sutures: Secondary | ICD-10-CM

## 2022-08-14 NOTE — Progress Notes (Signed)
Patient arrived for nurse visit to remove sutures post-AVR 1/26 by Dr. Cyndia Bent.  Two sutures removed with no signs or symptoms of infection noted.  Incisions well approximated. Patient tolerated suture removal well.  Patients states he has experienced lower extremity edema.  Per patient, he consumed Gatorade over the weekend and noticed lower leg swelling after that. States swelling has gone down today. Very minimal ankle swelling during RN visit. Reported daily weights beginning Saturday 227.2lb, 226.6lb, and 225.6lb. States he becomes winded after ambulation. Denies SOB while at rest. Advised patient to elevate legs while sitting. Advised to continue using IS and to call if SOB. Advised patient to continue to record daily weights and to monitor sodium intake. Patient instructed to keep the incision site clean and dry. Patient acknowledged instructions given.  All questions answered.

## 2022-08-15 ENCOUNTER — Telehealth: Payer: Self-pay | Admitting: *Deleted

## 2022-08-15 ENCOUNTER — Telehealth: Payer: Self-pay | Admitting: Cardiology

## 2022-08-15 NOTE — Telephone Encounter (Signed)
Patient is requesting to speak with Dr. Bettina Gavia or nurse about his job. Says that his job is requesting STD paperwork from the time he was out of work the beginning to January to when he saw Dr. Cyndia Bent in consultation. Dr. Cyndia Bent advised patient to contact primary Cardiologist for paperwork

## 2022-08-15 NOTE — Telephone Encounter (Signed)
Patient contacted the office stating his job is requesting STD paperwork from the time he was out of work the beginning to January to when he saw Dr. Cyndia Bent in consultation. Advised patient to contact primary Cardiologist for paperwork. Patient also states he drank milk this morning and got nauseous. Patient reports nausea to be an isolated event. Advised patient to try and eat prior to taking morning meds. Advised patient to contact the office if nausea occurs more frequently. Patient reports leg swelling and SOB have improved since yesterday. Advised patient to call with any further concerns.

## 2022-08-16 NOTE — Telephone Encounter (Signed)
Called patient and informed him that Dr. Bettina Gavia would complete the paperwork for his short term disability up to the date of his surgery. Unfortunately I do not think that we still have the paperwork in the office. I explained to the patient that we would look for the paperwork and reach out to him if we could not find it. Patient was agreeable with this plan.

## 2022-08-17 ENCOUNTER — Telehealth: Payer: Self-pay

## 2022-08-17 ENCOUNTER — Telehealth (HOSPITAL_COMMUNITY): Payer: Self-pay

## 2022-08-17 ENCOUNTER — Telehealth: Payer: Self-pay | Admitting: Cardiology

## 2022-08-17 NOTE — Telephone Encounter (Signed)
Called pt with Dr. Joya Gaskins recommendation to come in tomorrow and if he gets worse he should go to the ED. Pt agreed and verbalized understanding and had no further questions.

## 2022-08-17 NOTE — Telephone Encounter (Signed)
Spoke with pt. He stated that yesterday he started having SOB especially when walking across room. No Chest pain, No leg swelling, 02 sat at home 87-91%. Has appt tomorrow at 1 with Dr. Bettina Gavia and does not want to go to ED.

## 2022-08-17 NOTE — Telephone Encounter (Signed)
Sent to Dr. Bettina Gavia for further advice

## 2022-08-17 NOTE — Progress Notes (Addendum)
Cardiology Office Note:    Date:  08/18/2022   ID:  [name], DOB [birth year], MRN [id number] PCP:  Raina Mina., MD  Cardiologist:  Shirlee More, MD    Referring MD: Raina Mina., MD    ASSESSMENT:    1. SOB (shortness of breath) on exertion   2. S/P AVR (aortic valve replacement)   3. Hypertensive heart disease with chronic diastolic congestive heart failure ([place])   4. Mixed hyperlipidemia    PLAN:    In order of problems listed above:  I suspect this is a postoperative issue with pleural effusion other potential causes would include severe atelectasis or potential of pulmonary embolism no indication of valve dysfunction and he had no postoperative pericardial complication.  Which shortness of breath at rest I think he is best served by EMS transportation. Somewhat confusing picture his weight is down he has no overt findings of heart failure he will require an echocardiogram   Next appointment: To be determined after ED evaluation   Medication Adjustments/Labs and Tests Ordered: Current medicines are reviewed at length with the patient today.  Concerns regarding medicines are outlined above.  No orders of the defined types were placed in this encounter.  No orders of the defined types were placed in this encounter.   Chief Complaint  Patient presents with   Shortness of Breath    History of Present Illness:    Craig Alvarez is a 62 y.o. male with a hx of severe symptomatic aortic stenosis with heart failure hypertension hyperlipidemia bicuspid aortic valve with surgical aortic valve replacement 08/04/2022 with an Edwards 27 mm Inspiris Resilia aortic valve last seen by me preoperatively.  He had an uneventful postoperative course and follow my office saying he was quite short of breath with activity.  Chest x-ray last week 08/09/2022 showed cardiomegaly no pleural effusion or pulmonary infiltrate. Recent labs 08/06/2022 hemoglobin 11.0 platelets 124,000 sodium 133  potassium 4.0 GFR greater than 60 cc/min.  Compliance with diet, lifestyle and medications: Yes  Since he was returned home is becoming increasingly short of breath.  He took a diuretic for 1 day which seemed to help a little however his weight is down 8 pounds since he returned home no fever chills no sputum no wheezing he has been nauseous. His physical exam is abnormal markedly diminished breath sounds left lung and very tubular breath sounds in the right chest and short of breath at rest I suspect this is postoperative may have a large pleural effusion and I think he is best served by going to [facility] ED.  His mother brought him here and he says he feels comfortable making this car trip. Past Medical History:  Diagnosis Date   Acute respiratory disease due to COVID-19 virus 04/26/2020   Anxiety 01/21/2019   Bicuspid aortic valve    Chronic bilateral low back pain without sciatica 12/12/2017   Depression    Dyslipidemia 12/21/2015   ED (erectile dysfunction) 11/04/2015   Essential hypertension 01/21/2019   Hypercholesteremia    Insomnia 01/21/2019   Mixed hyperlipidemia 01/21/2019   Nonrheumatic aortic valve stenosis 12/21/2015   Prediabetes 11/04/2015   Restless legs syndrome (RLS) 01/21/2019   Tear of meniscus of knee 12/27/2018    Past Surgical History:  Procedure Laterality Date   AORTIC VALVE REPLACEMENT N/A 08/04/2022   Procedure: AORTIC VALVE REPLACEMENT (AVR) USING EDWARDS 27 MM INSPIRIS RESILIA AORTIC VALVE;  Surgeon: Gaye Pollack, MD;  Location: [place];  Service: Open  Heart Surgery;  Laterality: N/A;  Median sternotomy   CARDIAC CATHETERIZATION     KNEE ARTHROSCOPY WITH MENISCAL REPAIR Right 2020   RIGHT HEART CATH AND CORONARY ANGIOGRAPHY N/A 07/13/2022   Procedure: RIGHT HEART CATH AND CORONARY ANGIOGRAPHY;  Surgeon: Early Osmond, MD;  Location: [place] [facility];  Service: Cardiovascular;  Laterality: N/A;   TEE WITHOUT CARDIOVERSION N/A 08/04/2022    Procedure: TRANSESOPHAGEAL ECHOCARDIOGRAM (TEE);  Surgeon: Gaye Pollack, MD;  Location: [place];  Service: Open Heart Surgery;  Laterality: N/A;    Current Medications: Current Meds  Medication Sig   ALPRAZolam (XANAX) 1 MG tablet Take 1 mg by mouth at bedtime as needed for sleep.   amoxicillin (AMOXIL) 500 MG tablet Take 2,000 mg by mouth See admin instructions. Take 2000 mg by mouth1 hour before dental procedures   aspirin 81 MG chewable tablet Chew 81 mg by mouth in the morning.   atorvastatin (LIPITOR) 20 MG tablet Take 20 mg by mouth every evening.   CALCIUM-VITAMIN D PO Take 1 tablet by mouth in the morning.   ezetimibe (ZETIA) 10 MG tablet Take 1 tablet (10 mg total) by mouth daily.   ibuprofen (ADVIL) 200 MG tablet Take 400 mg by mouth every 8 (eight) hours as needed for moderate pain.   lisinopril (ZESTRIL) 20 MG tablet Take 20 mg by mouth in the morning.   loratadine (CLARITIN) 10 MG tablet Take 10 mg by mouth daily as needed for allergies.   metoprolol tartrate (LOPRESSOR) 50 MG tablet Take 1 tablet (50 mg total) by mouth 2 (two) times daily.   Multiple Vitamin (MULTIVITAMIN) tablet Take 1 tablet by mouth in the morning.   Omega-3 1000 MG CAPS Take 1,000 mg by mouth in the morning.   OVER THE COUNTER MEDICATION Take 2 capsules by mouth at bedtime. Relaxium   rOPINIRole (REQUIP) 0.5 MG tablet Take 0.5 mg by mouth at bedtime.   sildenafil (REVATIO) 20 MG tablet Take 60-80 mg by mouth daily as needed (erectile dysfunction).     Allergies:   Patient has no known allergies.   Social History   Socioeconomic History   Marital status: Single    Spouse name: Not on file   Number of children: Not on file   Years of education: Not on file   Highest education level: Not on file  Occupational History   Not on file  Tobacco Use   Smoking status: Former    Types: Cigarettes    Quit date: 1999    Years since quitting: 25.1   Smokeless tobacco: Never  Vaping Use   Vaping Use:  Never used  Substance and Sexual Activity   Alcohol use: Yes    Alcohol/week: 33.0 - 35.0 standard drinks of alcohol    Types: 12 - 14 Glasses of wine, 21 Cans of beer per week   Drug use: Yes    Types: Marijuana    Comment: As of 08/02/22 last marijuana use: 08/01/22   Sexual activity: Not on file  Other Topics Concern   Not on file  Social History Narrative   Not on file   Social Determinants of Health   Financial Resource Strain: Not on file  Food Insecurity: Not on file  Transportation Needs: Not on file  Physical Activity: Not on file  Stress: Not on file  Social Connections: Not on file     Family History: The patient's family history includes COPD in his mother; Cancer in his maternal grandmother;  Diabetes in his brother; Heart attack in his father. ROS:   Please see the history of present illness.    All other systems reviewed and are negative.  EKGs/Labs/Other Studies Reviewed:    The following studies were reviewed today:    Recent Labs: 05/26/2022: NT-Pro BNP 469 08/02/2022: ALT 28 08/05/2022: Magnesium 2.4 08/06/2022: BUN 25; Creatinine, Ser 1.02; Hemoglobin 11.0; Platelets 124; Potassium 4.0; Sodium 133  Recent Lipid Panel    Component Value Date/Time   CHOL 181 05/26/2022 1444   TRIG 252 (H) 05/26/2022 1444   HDL 39 (L) 05/26/2022 1444   CHOLHDL 4.6 05/26/2022 1444   LDLCALC 99 05/26/2022 1444    Physical Exam:    VS:  BP 124/70 (BP Location: Right Arm, Patient Position: Sitting)   Pulse 100   Ht 6' (1.829 m)   Wt 224 lb (101.6 kg)   SpO2 96%   BMI 30.38 kg/m     Wt Readings from Last 3 Encounters:  08/18/22 224 lb (101.6 kg)  08/10/22 229 lb (103.9 kg)  08/02/22 239 lb (108.4 kg)     GEN: He looks breathless well nourished, well developed in no acute distress HEENT: Normal NECK: No JVD; No carotid bruits LYMPHATICS: No lymphadenopathy CARDIAC: RRR, no murmurs, rubs, gallops RESPIRATORY: Diminished breath sounds left chest tubular  breath sounds right chest posteriorly ABDOMEN: Soft, non-tender, non-distended MUSCULOSKELETAL:  No edema; No deformity  SKIN: Warm and dry NEUROLOGIC:  Alert and oriented x 3 PSYCHIATRIC:  Normal affect   Seen with Darius Bump, CMA chaperone   Signed, Shirlee More, MD  08/18/2022 12:55 PM    [facility]

## 2022-08-17 NOTE — Telephone Encounter (Signed)
Pt c/o Shortness Of Breath: STAT if SOB developed within the last 24 hours or pt is noticeably SOB on the phone  1. Are you currently SOB (can you hear that pt is SOB on the phone)? no  2. How long have you been experiencing SOB? yesterday  3. Are you SOB when sitting or when up moving around? Moving around  4. Are you currently experiencing any other symptoms? Uneasy feeling after he eats, not nausea   Patient states he has started getting SOB within the last 24 hrs. He says this morning he was afraid the fall asleep, because he was gasping for air. He says he took a fluid pill, because he thinks the SOB may be from fluid. He says he does not have swelling in his feet. He says the SOB is the worst it has been. He says he does have an appetite, but when he eats he gets an uneasy feeling. He says it is not nausea. He says his oxygen on the left hand was 87 and on the right it was 91.

## 2022-08-17 NOTE — Telephone Encounter (Signed)
Per phase I cardiac rehab, fax referral to [place].

## 2022-08-18 ENCOUNTER — Encounter (HOSPITAL_COMMUNITY): Payer: Self-pay

## 2022-08-18 ENCOUNTER — Emergency Department (HOSPITAL_COMMUNITY): Payer: 59

## 2022-08-18 ENCOUNTER — Ambulatory Visit: Payer: 59 | Attending: Cardiology | Admitting: Cardiology

## 2022-08-18 ENCOUNTER — Inpatient Hospital Stay (HOSPITAL_COMMUNITY)
Admission: EM | Admit: 2022-08-18 | Discharge: 2022-08-22 | DRG: 315 | Disposition: A | Discharge status: Home / Self Care | Payer: 59 | Attending: Cardiovascular Disease | Admitting: Cardiovascular Disease

## 2022-08-18 ENCOUNTER — Emergency Department (EMERGENCY_DEPARTMENT_HOSPITAL): Payer: 59

## 2022-08-18 ENCOUNTER — Encounter: Payer: Self-pay | Admitting: Cardiology

## 2022-08-18 ENCOUNTER — Other Ambulatory Visit: Payer: Self-pay

## 2022-08-18 ENCOUNTER — Encounter (HOSPITAL_COMMUNITY)
Admission: EM | Disposition: A | Discharge status: Home / Self Care | Payer: Self-pay | Source: Home / Self Care | Attending: Cardiovascular Disease

## 2022-08-18 VITALS — BP 124/70 | HR 100 | Ht 72.0 in | Wt 224.0 lb

## 2022-08-18 DIAGNOSIS — I314 Cardiac tamponade: Secondary | ICD-10-CM | POA: Diagnosis present

## 2022-08-18 DIAGNOSIS — I3139 Other pericardial effusion (noninflammatory): Secondary | ICD-10-CM

## 2022-08-18 DIAGNOSIS — E7849 Other hyperlipidemia: Secondary | ICD-10-CM | POA: Diagnosis not present

## 2022-08-18 DIAGNOSIS — I110 Hypertensive heart disease with heart failure: Secondary | ICD-10-CM | POA: Diagnosis present

## 2022-08-18 DIAGNOSIS — I10 Essential (primary) hypertension: Secondary | ICD-10-CM | POA: Diagnosis present

## 2022-08-18 DIAGNOSIS — G2581 Restless legs syndrome: Secondary | ICD-10-CM | POA: Diagnosis present

## 2022-08-18 DIAGNOSIS — R000 Tachycardia, unspecified: Secondary | ICD-10-CM | POA: Diagnosis present

## 2022-08-18 DIAGNOSIS — E782 Mixed hyperlipidemia: Secondary | ICD-10-CM

## 2022-08-18 DIAGNOSIS — Z825 Family history of asthma and other chronic lower respiratory diseases: Secondary | ICD-10-CM | POA: Diagnosis not present

## 2022-08-18 DIAGNOSIS — Z833 Family history of diabetes mellitus: Secondary | ICD-10-CM | POA: Diagnosis not present

## 2022-08-18 DIAGNOSIS — Z952 Presence of prosthetic heart valve: Secondary | ICD-10-CM

## 2022-08-18 DIAGNOSIS — R0602 Shortness of breath: Secondary | ICD-10-CM

## 2022-08-18 DIAGNOSIS — Z953 Presence of xenogenic heart valve: Secondary | ICD-10-CM | POA: Diagnosis not present

## 2022-08-18 DIAGNOSIS — Z8774 Personal history of (corrected) congenital malformations of heart and circulatory system: Secondary | ICD-10-CM | POA: Diagnosis not present

## 2022-08-18 DIAGNOSIS — I5032 Chronic diastolic (congestive) heart failure: Secondary | ICD-10-CM | POA: Diagnosis present

## 2022-08-18 DIAGNOSIS — Z8249 Family history of ischemic heart disease and other diseases of the circulatory system: Secondary | ICD-10-CM

## 2022-08-18 DIAGNOSIS — Z8616 Personal history of COVID-19: Secondary | ICD-10-CM | POA: Diagnosis not present

## 2022-08-18 DIAGNOSIS — Z79899 Other long term (current) drug therapy: Secondary | ICD-10-CM | POA: Diagnosis not present

## 2022-08-18 DIAGNOSIS — Z7982 Long term (current) use of aspirin: Secondary | ICD-10-CM

## 2022-08-18 DIAGNOSIS — J9811 Atelectasis: Secondary | ICD-10-CM | POA: Diagnosis present

## 2022-08-18 HISTORY — DX: Other pericardial effusion (noninflammatory): I31.39

## 2022-08-18 HISTORY — PX: PERICARDIOCENTESIS: CATH118255

## 2022-08-18 LAB — ECHOCARDIOGRAM COMPLETE
AR max vel: 3.62 cm2
AV Area VTI: 3.98 cm2
AV Area mean vel: 3.42 cm2
AV Mean grad: 3 mmHg
AV Peak grad: 5.6 mmHg
Ao pk vel: 1.18 m/s
Area-P 1/2: 5.09 cm2
Height: 72 in
S' Lateral: 3.4 cm
Weight: 3504 oz

## 2022-08-18 LAB — GRAM STAIN: Gram Stain: NONE SEEN

## 2022-08-18 LAB — BODY FLUID CELL COUNT WITH DIFFERENTIAL
Eos, Fluid: 2 %
Lymphs, Fluid: 29 %
Monocyte-Macrophage-Serous Fluid: 6 % — ABNORMAL LOW (ref 50–90)
Neutrophil Count, Fluid: 62 % — ABNORMAL HIGH (ref 0–25)
Other Cells, Fluid: 1 %
Total Nucleated Cell Count, Fluid: UNDETERMINED cu mm (ref 0–1000)

## 2022-08-18 LAB — COMPREHENSIVE METABOLIC PANEL
ALT: 31 U/L (ref 0–44)
AST: 20 U/L (ref 15–41)
Albumin: 3.2 g/dL — ABNORMAL LOW (ref 3.5–5.0)
Alkaline Phosphatase: 82 U/L (ref 38–126)
Anion gap: 11 (ref 5–15)
BUN: 24 mg/dL — ABNORMAL HIGH (ref 8–23)
CO2: 23 mmol/L (ref 22–32)
Calcium: 9.3 mg/dL (ref 8.9–10.3)
Chloride: 101 mmol/L (ref 98–111)
Creatinine, Ser: 0.98 mg/dL (ref 0.61–1.24)
GFR, Estimated: >60 mL/min (ref >60)
Glucose, Bld: 115 mg/dL — ABNORMAL HIGH (ref 70–99)
Potassium: 4.3 mmol/L (ref 3.5–5.1)
Sodium: 135 mmol/L (ref 135–145)
Total Bilirubin: 1 mg/dL (ref 0.3–1.2)
Total Protein: 6.6 g/dL (ref 6.5–8.1)

## 2022-08-18 LAB — BRAIN NATRIURETIC PEPTIDE: B Natriuretic Peptide: 234.2 pg/mL — ABNORMAL HIGH (ref 0.0–100.0)

## 2022-08-18 LAB — CBC WITH DIFFERENTIAL/PLATELET
Abs Immature Granulocytes: 0.03 10*3/uL (ref 0.00–0.07)
Basophils Absolute: 0 10*3/uL (ref 0.0–0.1)
Basophils Relative: 1 %
Eosinophils Absolute: 0 10*3/uL (ref 0.0–0.5)
Eosinophils Relative: 0 %
HCT: 29.8 % — ABNORMAL LOW (ref 39.0–52.0)
Hemoglobin: 9.7 g/dL — ABNORMAL LOW (ref 13.0–17.0)
Immature Granulocytes: 0 %
Lymphocytes Relative: 13 %
Lymphs Abs: 0.9 10*3/uL (ref 0.7–4.0)
MCH: 31.8 pg (ref 26.0–34.0)
MCHC: 32.6 g/dL (ref 30.0–36.0)
MCV: 97.7 fL (ref 80.0–100.0)
Monocytes Absolute: 0.6 10*3/uL (ref 0.1–1.0)
Monocytes Relative: 9 %
Neutro Abs: 5.4 10*3/uL (ref 1.7–7.7)
Neutrophils Relative %: 77 %
Platelets: 532 10*3/uL — ABNORMAL HIGH (ref 150–400)
RBC: 3.05 MIL/uL — ABNORMAL LOW (ref 4.22–5.81)
RDW: 13.2 % (ref 11.5–15.5)
WBC: 7 10*3/uL (ref 4.0–10.5)
nRBC: 0 % (ref 0.0–0.2)

## 2022-08-18 LAB — ECHOCARDIOGRAM LIMITED
Height: 72 in
Weight: 3504 oz

## 2022-08-18 LAB — TROPONIN I (HIGH SENSITIVITY): Troponin I (High Sensitivity): 18 ng/L — ABNORMAL HIGH (ref <18)

## 2022-08-18 LAB — RESP PANEL BY RT-PCR (RSV, FLU A&B, COVID)  RVPGX2
Influenza A by PCR: NEGATIVE
Influenza B by PCR: NEGATIVE
Resp Syncytial Virus by PCR: NEGATIVE
SARS Coronavirus 2 by RT PCR: NEGATIVE

## 2022-08-18 LAB — MRSA NEXT GEN BY PCR, NASAL: MRSA by PCR Next Gen: NOT DETECTED

## 2022-08-18 SURGERY — PERICARDIOCENTESIS
Anesthesia: LOCAL

## 2022-08-18 MED ORDER — ADULT MULTIVITAMIN W/MINERALS CH: 1.0000 | ORAL_TABLET | Freq: Every day | ORAL | Status: DC

## 2022-08-18 MED ORDER — LORAZEPAM 0.5 MG PO TABS: 1.0000 mg | ORAL_TABLET | ORAL | Status: AC | PRN

## 2022-08-18 MED ORDER — THIAMINE HCL 100 MG/ML IJ SOLN
100.0000 mg | Freq: Every day | INTRAMUSCULAR | Status: DC
  Filled 2022-08-18: qty 2

## 2022-08-18 MED ORDER — HEPARIN (PORCINE) IN NACL 1000-0.9 UT/500ML-% IV SOLN
INTRAVENOUS | Status: DC | PRN
  Administered 2022-08-18: 500 mL

## 2022-08-18 MED ORDER — ORAL CARE MOUTH RINSE: 15.0000 mL | OROMUCOSAL | Status: DC | PRN

## 2022-08-18 MED ORDER — MIDAZOLAM HCL 2 MG/2ML IJ SOLN
INTRAMUSCULAR | Status: AC
  Filled 2022-08-18: qty 2

## 2022-08-18 MED ORDER — METOPROLOL TARTRATE 50 MG PO TABS
50.0000 mg | ORAL_TABLET | Freq: Two times a day (BID) | ORAL | Status: DC
  Administered 2022-08-18 – 2022-08-22 (×8): 50 mg via ORAL
  Filled 2022-08-18 (×8): qty 1

## 2022-08-18 MED ORDER — FOLIC ACID 1 MG PO TABS
1.0000 mg | ORAL_TABLET | Freq: Every day | ORAL | Status: DC
  Administered 2022-08-18 – 2022-08-22 (×5): 1 mg via ORAL
  Filled 2022-08-18 (×5): qty 1

## 2022-08-18 MED ORDER — LORATADINE 10 MG PO TABS: 10.0000 mg | ORAL_TABLET | Freq: Every day | ORAL | Status: DC | PRN

## 2022-08-18 MED ORDER — LORAZEPAM 2 MG/ML IJ SOLN: 1.0000 mg | INTRAMUSCULAR | Status: AC | PRN

## 2022-08-18 MED ORDER — EZETIMIBE 10 MG PO TABS
10.0000 mg | ORAL_TABLET | Freq: Every day | ORAL | Status: DC
  Administered 2022-08-19 – 2022-08-22 (×4): 10 mg via ORAL
  Filled 2022-08-18 (×4): qty 1

## 2022-08-18 MED ORDER — ALPRAZOLAM 0.5 MG PO TABS
1.0000 mg | ORAL_TABLET | Freq: Every evening | ORAL | Status: DC | PRN
  Administered 2022-08-18 – 2022-08-21 (×3): 1 mg via ORAL
  Filled 2022-08-18 (×3): qty 2

## 2022-08-18 MED ORDER — IBUPROFEN 400 MG PO TABS
400.0000 mg | ORAL_TABLET | Freq: Three times a day (TID) | ORAL | Status: DC | PRN
  Administered 2022-08-19 – 2022-08-20 (×2): 400 mg via ORAL
  Filled 2022-08-18 (×3): qty 1

## 2022-08-18 MED ORDER — ATORVASTATIN CALCIUM 10 MG PO TABS
20.0000 mg | ORAL_TABLET | Freq: Every evening | ORAL | Status: DC
  Administered 2022-08-18 – 2022-08-21 (×4): 20 mg via ORAL
  Filled 2022-08-18 (×4): qty 2

## 2022-08-18 MED ORDER — HEPARIN (PORCINE) IN NACL 1000-0.9 UT/500ML-% IV SOLN
INTRAVENOUS | Status: AC
  Filled 2022-08-18: qty 500

## 2022-08-18 MED ORDER — ROPINIROLE HCL 0.5 MG PO TABS
0.5000 mg | ORAL_TABLET | Freq: Every day | ORAL | Status: DC
  Administered 2022-08-18 – 2022-08-21 (×4): 0.5 mg via ORAL
  Filled 2022-08-18 (×5): qty 1

## 2022-08-18 MED ORDER — ADULT MULTIVITAMIN W/MINERALS CH
1.0000 | ORAL_TABLET | Freq: Every morning | ORAL | Status: DC
  Administered 2022-08-19 – 2022-08-22 (×4): 1 via ORAL
  Filled 2022-08-18 (×4): qty 1

## 2022-08-18 MED ORDER — FENTANYL CITRATE (PF) 100 MCG/2ML IJ SOLN
INTRAMUSCULAR | Status: AC
  Filled 2022-08-18: qty 2

## 2022-08-18 MED ORDER — IPRATROPIUM-ALBUTEROL 0.5-2.5 (3) MG/3ML IN SOLN
3.0000 mL | Freq: Once | RESPIRATORY_TRACT | Status: AC
  Administered 2022-08-18: 3 mL via RESPIRATORY_TRACT
  Filled 2022-08-18: qty 3

## 2022-08-18 MED ORDER — LIDOCAINE HCL (PF) 1 % IJ SOLN
INTRAMUSCULAR | Status: AC
  Filled 2022-08-18: qty 30

## 2022-08-18 MED ORDER — MIDAZOLAM HCL 2 MG/2ML IJ SOLN
INTRAMUSCULAR | Status: DC | PRN
  Administered 2022-08-18 (×2): 1 mg via INTRAVENOUS
  Administered 2022-08-18: 2 mg via INTRAVENOUS

## 2022-08-18 MED ORDER — ASPIRIN 81 MG PO CHEW
81.0000 mg | CHEWABLE_TABLET | Freq: Every morning | ORAL | Status: DC
  Administered 2022-08-19 – 2022-08-22 (×4): 81 mg via ORAL
  Filled 2022-08-18 (×4): qty 1

## 2022-08-18 MED ORDER — LIDOCAINE HCL (PF) 1 % IJ SOLN
INTRAMUSCULAR | Status: DC | PRN
  Administered 2022-08-18 (×2): 15 mL

## 2022-08-18 MED ORDER — CHLORHEXIDINE GLUCONATE CLOTH 2 % EX PADS
6.0000 | MEDICATED_PAD | Freq: Every day | CUTANEOUS | Status: DC
  Administered 2022-08-18 – 2022-08-19 (×2): 6 via TOPICAL

## 2022-08-18 MED ORDER — LISINOPRIL 20 MG PO TABS
20.0000 mg | ORAL_TABLET | Freq: Every morning | ORAL | Status: DC
  Administered 2022-08-19 – 2022-08-22 (×4): 20 mg via ORAL
  Filled 2022-08-18 (×4): qty 1

## 2022-08-18 MED ORDER — FENTANYL CITRATE (PF) 100 MCG/2ML IJ SOLN
INTRAMUSCULAR | Status: DC | PRN
  Administered 2022-08-18: 50 ug via INTRAVENOUS
  Administered 2022-08-18: 25 ug via INTRAVENOUS
  Administered 2022-08-18: 50 ug via INTRAVENOUS

## 2022-08-18 MED ORDER — THIAMINE MONONITRATE 100 MG PO TABS
100.0000 mg | ORAL_TABLET | Freq: Every day | ORAL | Status: DC
  Administered 2022-08-18 – 2022-08-22 (×5): 100 mg via ORAL
  Filled 2022-08-18 (×5): qty 1

## 2022-08-18 MED FILL — Mannitol IV Soln 20%: INTRAVENOUS | Qty: 500 | Status: AC

## 2022-08-18 MED FILL — Lidocaine HCl Local Preservative Free (PF) Inj 2%: INTRAMUSCULAR | Qty: 14 | Status: AC

## 2022-08-18 MED FILL — Sodium Bicarbonate IV Soln 8.4%: INTRAVENOUS | Qty: 50 | Status: AC

## 2022-08-18 MED FILL — Electrolyte-R (PH 7.4) Solution: INTRAVENOUS | Qty: 3000 | Status: AC

## 2022-08-18 MED FILL — Sodium Chloride IV Soln 0.9%: INTRAVENOUS | Qty: 2000 | Status: AC

## 2022-08-18 MED FILL — Heparin Sodium (Porcine) Inj 1000 Unit/ML: INTRAMUSCULAR | Qty: 20 | Status: AC

## 2022-08-18 SURGICAL SUPPLY — 4 items
KIT MICROPUNCTURE NIT STIFF (SHEATH) IMPLANT
SHEATH PROBE COVER 6X72 (BAG) IMPLANT
TRANSDUCER W/STOPCOCK (MISCELLANEOUS) IMPLANT
TRAY PERICARDIOCENTESIS 6FX60 (TRAY / TRAY PROCEDURE) IMPLANT

## 2022-08-18 NOTE — ED Provider Notes (Signed)
Clinical Course as of 08/18/22 1523  Fri Aug 18, 2022  1516 Watcher: 43 YOM with recent TAVR and new pericarcial effusion CT Surgery- Bardle's PA aware and wants echo.  [CC]    Clinical Course User Index [CC] Tretha Sciara, MD  Patient with a large pericardial effusion that had to be stabilized in emergency room prior to emergent transportation to the operative room for pericardiocentesis.  Frequently reassessed no acute distress.  Patient to OR with no further acute events. CRITICAL CARE Performed by: Tretha Sciara   Total critical care time: 35 minutes Care of patient was taken over from prior emergency department provider Critical care time was exclusive of separately billable procedures and treating other patients.  Critical care was necessary to treat or prevent imminent or life-threatening deterioration.  Critical care was time spent personally by me on the following activities: development of treatment plan with patient and/or surrogate as well as nursing, discussions with consultants, evaluation of patient's response to treatment, examination of patient, obtaining history from patient or surrogate, ordering and performing treatments and interventions, ordering and review of laboratory studies, ordering and review of radiographic studies, pulse oximetry and re-evaluation of patient's condition.    Tretha Sciara, MD 08/18/22 2358

## 2022-08-18 NOTE — Progress Notes (Signed)
  Echocardiogram 2D Echocardiogram has been performed.  Craig Alvarez 08/18/2022, 4:00 PM

## 2022-08-18 NOTE — ED Provider Notes (Signed)
[facility] Provider Note   CSN: [id number] Arrival date & time: 08/18/22  1405     History  Chief Complaint  Patient presents with   Shortness of Breath    Craig Alvarez is a 62 y.o. male.  62 yo M with a chief complaints of difficulty breathing.  He tells me that this been going on for almost a week now.  He has had some pinpoint right-sided chest discomfort as well.  He saw his cardiologist in the office today they were concerned about possible pleural effusion and sent him here for evaluation.  Patient did have a aortic valve replacement done recently.  He denies cough congestion or fever.   Shortness of Breath      Home Medications Prior to Admission medications   Medication Sig Start Date End Date Taking? Authorizing Provider  ALPRAZolam Duanne Moron) 1 MG tablet Take 1 mg by mouth at bedtime as needed for sleep. 04/06/22   [provider]  amoxicillin (AMOXIL) 500 MG tablet Take 2,000 mg by mouth See admin instructions. Take 2000 mg by mouth1 hour before dental procedures    [provider]  aspirin 81 MG chewable tablet Chew 81 mg by mouth in the morning.    [provider]  atorvastatin (LIPITOR) 20 MG tablet Take 20 mg by mouth every evening. 12/20/18   [provider]  CALCIUM-VITAMIN D PO Take 1 tablet by mouth in the morning.    [provider]  ezetimibe (ZETIA) 10 MG tablet Take 1 tablet (10 mg total) by mouth daily. 06/06/22   Richardo Priest, MD  ibuprofen (ADVIL) 200 MG tablet Take 400 mg by mouth every 8 (eight) hours as needed for moderate pain.    [provider]  lisinopril (ZESTRIL) 20 MG tablet Take 20 mg by mouth in the morning. 12/07/21 12/07/22  [provider]  loratadine (CLARITIN) 10 MG tablet Take 10 mg by mouth daily as needed for allergies.    [provider]  metoprolol tartrate (LOPRESSOR) 50 MG tablet Take 1 tablet (50 mg total) by mouth 2  (two) times daily. 08/10/22   Antony Odea, PA-C  Multiple Vitamin (MULTIVITAMIN) tablet Take 1 tablet by mouth in the morning.    [provider]  Omega-3 1000 MG CAPS Take 1,000 mg by mouth in the morning.    [provider]  OVER THE COUNTER MEDICATION Take 2 capsules by mouth at bedtime. Relaxium    [provider]  rOPINIRole (REQUIP) 0.5 MG tablet Take 0.5 mg by mouth at bedtime.    [provider]  sildenafil (REVATIO) 20 MG tablet Take 60-80 mg by mouth daily as needed (erectile dysfunction).    [provider]      Allergies    Patient has no known allergies.    Review of Systems   Review of Systems  Respiratory:  Positive for shortness of breath.     Physical Exam Updated Vital Signs BP 124/83   Pulse 94   Temp 98.4 F (36.9 C) (Oral)   Resp (!) 21   Ht 6' (1.829 m)   Wt 99.3 kg   SpO2 96%   BMI 29.70 kg/m  Physical Exam Vitals and nursing note reviewed.  Constitutional:      Appearance: He is well-developed.  HENT:     Head: Normocephalic and atraumatic.  Eyes:     Pupils: Pupils are equal, round, and reactive to light.  Neck:     Vascular: No JVD.  Cardiovascular:     Rate and Rhythm: Normal rate and regular rhythm.     Heart sounds: No murmur heard.    No friction rub. No gallop.  Pulmonary:     Effort: No respiratory distress.     Breath sounds: Wheezing present.     Comments: End expiratory wheezes. Abdominal:     General: There is no distension.     Tenderness: There is no abdominal tenderness. There is no guarding or rebound.  Musculoskeletal:        General: Normal range of motion.     Cervical back: Normal range of motion and neck supple.  Skin:    Coloration: Skin is not pale.     Findings: No rash.  Neurological:     Mental Status: He is alert and oriented to person, place, and time.  Psychiatric:        Behavior: Behavior normal.     ED Results / Procedures / Treatments   Labs (all  labs ordered are listed, but only abnormal results are displayed) Labs Reviewed  CBC WITH DIFFERENTIAL/PLATELET - Abnormal; Notable for the following components:      Result Value   RBC 3.05 (*)    Hemoglobin 9.7 (*)    HCT 29.8 (*)    Platelets 532 (*)    All other components within normal limits  RESP PANEL BY RT-PCR (RSV, FLU A&B, COVID)  [id number]  COMPREHENSIVE METABOLIC PANEL  BRAIN NATRIURETIC PEPTIDE  TROPONIN I (HIGH SENSITIVITY)    EKG EKG Interpretation  Date/Time:  Friday August 18 2022 14:05:52 EST Ventricular Rate:  95 PR Interval:  137 QRS Duration: 103 QT Interval:  334 QTC Calculation: 420 R Axis:   62 Text Interpretation: Sinus rhythm Anteroseptal infarct, old Nonspecific T abnormalities, inferior leads No significant change since last tracing Confirmed by Deno Etienne [phone]) on 08/18/2022 2:15:58 PM  Radiology DG Chest Port 1 View  Result Date: 08/18/2022 CLINICAL DATA:  Shortness of breath EXAM: PORTABLE CHEST 1 VIEW COMPARISON:  08/09/2022 FINDINGS: Cardiomegaly and aortic valve replacement again noted. New LEFT LOWER lung opacity noted possible LEFT effusion. There is no evidence of pneumothorax. The RIGHT lung is clear. No acute bony abnormalities are present. Remote rib fractures are present. IMPRESSION: 1. New LEFT LOWER lung opacity which may represent atelectasis, consolidation and/or effusion. 2. Cardiomegaly. Electronically Signed   By: Margarette Canada M.D.   On: 08/18/2022 14:33    Procedures .Critical Care  Performed by: Deno Etienne, DO Authorized by: Deno Etienne, DO   Critical care provider statement:    Critical care time (minutes):  35   Critical care time was exclusive of:  Separately billable procedures and treating other patients   Critical care was time spent personally by me on the following activities:  Development of treatment plan with patient or surrogate, discussions with consultants, evaluation of patient's response to treatment, examination  of patient, ordering and review of laboratory studies, ordering and review of radiographic studies, ordering and performing treatments and interventions, pulse oximetry, re-evaluation of patient's condition and review of old charts   Care discussed with: admitting provider      EMERGENCY DEPARTMENT [place] CARDIAC EXAM "Study: Limited Ultrasound of the Heart and Pericardium"  INDICATIONS:Dyspnea Multiple views of the heart and pericardium were obtained in real-time with a multi-frequency probe.  PERFORMED [id number] IMAGES ARCHIVED?: Yes LIMITATIONS:  Body habitus and Emergent procedure VIEWS USED: Subcostal 4 chamber, Parasternal long  axis, and Apical 4 chamber  INTERPRETATION: Cardiac activity present, Pericardial effusion present, Cardiac tamponade absent, and Decreased contractility    Medications Ordered in ED Medications  ipratropium-albuterol (DUONEB) 0.5-2.5 (3) MG/3ML nebulizer solution 3 mL (3 mLs Nebulization Given 08/18/22 1453)    ED Course/ Medical Decision Making/ A&P Clinical Course as of 08/18/22 1520  Fri Aug 18, 2022  1516 Watcher: 17 YOM with recent TAVR and new pericarcial effusion CT Surgery- Bardle's PA aware and wants echo.  [CC]    Clinical Course User Index [CC] Tretha Sciara, MD                             Medical Decision Making Amount and/or Complexity of Data Reviewed Labs: ordered. Radiology: ordered.  Risk Prescription drug management.   62 yo M with a chief complaints of difficulty breathing.  This been going on for about 5 or 6 days now.  He was having some orthopnea.  This is recent past aortic valve replacement done by Dr. Cyndia Bent on 26 January.  He was seen in the cardiology office by Dr. Bettina Gavia, concern for a pleural effusion and was sent here for CT surgery evaluation imaging and blood work.  My exam is more concerning for a pericardial effusion.  I do not appreciate any diminished lung sounds.  Bedside ultrasound is also concerning for  pericardial effusion, no obvious tamponade on my exam.  Chest x-ray also with enlarged heart size.  I discussed the case with CT surgery, Ryan working with Dr. Cyndia Bent.  Arranging for emergent echo.  Signed out to Dr. Oswald Hillock, please see their note for further details of care in the ED.  The patients results and plan were reviewed and discussed.   Any x-rays performed were independently reviewed by myself.   Differential diagnosis were considered with the presenting HPI.  Medications  ipratropium-albuterol (DUONEB) 0.5-2.5 (3) MG/3ML nebulizer solution 3 mL (3 mLs Nebulization Given 08/18/22 1453)    Vitals:   08/18/22 1410 08/18/22 1411 08/18/22 1419 08/18/22 1430  BP: 127/85   124/83  Pulse: 94   94  Resp: 20   (!) 21  Temp: 98.4 F (36.9 C)     TempSrc: Oral     SpO2: 95%  95% 96%  Weight:  99.3 kg    Height:  6' (1.829 m)      Final diagnoses:  Pericardial effusion            Final Clinical Impression(s) / ED Diagnoses Final diagnoses:  Pericardial effusion    Rx / DC Orders ED Discharge Orders     None         Deno Etienne, DO 08/18/22 1521

## 2022-08-18 NOTE — Consult Note (Deleted)
Expand All Collapse All   Cardiology Office Note:     Date:  08/18/2022    ID:  [name], DOB [birth year], MRN [id number] PCP:  Raina Mina., MD      Cardiologist:  Shirlee More, MD     Referring MD: Raina Mina., MD      ASSESSMENT:     1. SOB (shortness of breath) on exertion   2. S/P AVR (aortic valve replacement)   3. Hypertensive heart disease with chronic diastolic congestive heart failure ([place])   4. Mixed hyperlipidemia     PLAN:     In order of problems listed above:   I suspect this is a postoperative issue with pleural effusion other potential causes would include severe atelectasis or potential of pulmonary embolism no indication of valve dysfunction and he had no postoperative pericardial complication.  Which shortness of breath at rest I think he is best served by EMS transportation. Somewhat confusing picture his weight is down he has no overt findings of heart failure he will require an echocardiogram     Next appointment: To be determined after ED evaluation     Medication Adjustments/Labs and Tests Ordered: Current medicines are reviewed at length with the patient today.  Concerns regarding medicines are outlined above.  No orders of the defined types were placed in this encounter.   No orders of the defined types were placed in this encounter.        Chief Complaint  Patient presents with   Shortness of Breath      History of Present Illness:     Craig Alvarez is a 62 y.o. male with a hx of severe symptomatic aortic stenosis with heart failure hypertension hyperlipidemia bicuspid aortic valve with surgical aortic valve replacement 08/04/2022 with an Edwards 27 mm Inspiris Resilia aortic valve last seen by me preoperatively.  He had an uneventful postoperative course and follow my office saying he was quite short of breath with activity.   Chest x-ray last week 08/09/2022 showed cardiomegaly no pleural effusion or pulmonary infiltrate. Recent  labs 08/06/2022 hemoglobin 11.0 platelets 124,000 sodium 133 potassium 4.0 GFR greater than 60 cc/min.   Compliance with diet, lifestyle and medications: Yes   Since he was returned home is becoming increasingly short of breath.  He took a diuretic for 1 day which seemed to help a little however his weight is down 8 pounds since he returned home no fever chills no sputum no wheezing he has been nauseous. His physical exam is abnormal markedly diminished breath sounds left lung and very tubular breath sounds in the right chest and short of breath at rest I suspect this is postoperative may have a large pleural effusion and I think he is best served by going to [facility] ED.  His mother brought him here and he says he feels comfortable making this car trip.     Past Medical History:  Diagnosis Date   Acute respiratory disease due to COVID-19 virus 04/26/2020   Anxiety 01/21/2019   Bicuspid aortic valve     Chronic bilateral low back pain without sciatica 12/12/2017   Depression     Dyslipidemia 12/21/2015   ED (erectile dysfunction) 11/04/2015   Essential hypertension 01/21/2019   Hypercholesteremia     Insomnia 01/21/2019   Mixed hyperlipidemia 01/21/2019   Nonrheumatic aortic valve stenosis 12/21/2015   Prediabetes 11/04/2015   Restless legs syndrome (RLS) 01/21/2019   Tear of meniscus of knee 12/27/2018  Past Surgical History:  Procedure Laterality Date   AORTIC VALVE REPLACEMENT N/A 08/04/2022    Procedure: AORTIC VALVE REPLACEMENT (AVR) USING EDWARDS 27 MM INSPIRIS RESILIA AORTIC VALVE;  Surgeon: Gaye Pollack, MD;  Location: [place];  Service: Open Heart Surgery;  Laterality: N/A;  Median sternotomy   CARDIAC CATHETERIZATION       KNEE ARTHROSCOPY WITH MENISCAL REPAIR Right 2020   RIGHT HEART CATH AND CORONARY ANGIOGRAPHY N/A 07/13/2022    Procedure: RIGHT HEART CATH AND CORONARY ANGIOGRAPHY;  Surgeon: Early Osmond, MD;  Location: [place] [facility];   Service: Cardiovascular;  Laterality: N/A;   TEE WITHOUT CARDIOVERSION N/A 08/04/2022    Procedure: TRANSESOPHAGEAL ECHOCARDIOGRAM (TEE);  Surgeon: Gaye Pollack, MD;  Location: [place];  Service: Open Heart Surgery;  Laterality: N/A;      Current Medications: Active Medications      Current Meds  Medication Sig   ALPRAZolam (XANAX) 1 MG tablet Take 1 mg by mouth at bedtime as needed for sleep.   amoxicillin (AMOXIL) 500 MG tablet Take 2,000 mg by mouth See admin instructions. Take 2000 mg by mouth1 hour before dental procedures   aspirin 81 MG chewable tablet Chew 81 mg by mouth in the morning.   atorvastatin (LIPITOR) 20 MG tablet Take 20 mg by mouth every evening.   CALCIUM-VITAMIN D PO Take 1 tablet by mouth in the morning.   ezetimibe (ZETIA) 10 MG tablet Take 1 tablet (10 mg total) by mouth daily.   ibuprofen (ADVIL) 200 MG tablet Take 400 mg by mouth every 8 (eight) hours as needed for moderate pain.   lisinopril (ZESTRIL) 20 MG tablet Take 20 mg by mouth in the morning.   loratadine (CLARITIN) 10 MG tablet Take 10 mg by mouth daily as needed for allergies.   metoprolol tartrate (LOPRESSOR) 50 MG tablet Take 1 tablet (50 mg total) by mouth 2 (two) times daily.   Multiple Vitamin (MULTIVITAMIN) tablet Take 1 tablet by mouth in the morning.   Omega-3 1000 MG CAPS Take 1,000 mg by mouth in the morning.   OVER THE COUNTER MEDICATION Take 2 capsules by mouth at bedtime. Relaxium   rOPINIRole (REQUIP) 0.5 MG tablet Take 0.5 mg by mouth at bedtime.   sildenafil (REVATIO) 20 MG tablet Take 60-80 mg by mouth daily as needed (erectile dysfunction).        Allergies:   Patient has no known allergies.    Social History         Socioeconomic History   Marital status: Single      Spouse name: Not on file   Number of children: Not on file   Years of education: Not on file   Highest education level: Not on file  Occupational History   Not on file  Tobacco Use   Smoking status: Former       Types: Cigarettes      Quit date: 1999      Years since quitting: 25.1   Smokeless tobacco: Never  Vaping Use   Vaping Use: Never used  Substance and Sexual Activity   Alcohol use: Yes      Alcohol/week: 33.0 - 35.0 standard drinks of alcohol      Types: 12 - 14 Glasses of wine, 21 Cans of beer per week   Drug use: Yes      Types: Marijuana      Comment: As of 08/02/22 last marijuana use: 08/01/22   Sexual activity: Not on  file  Other Topics Concern   Not on file  Social History Narrative   Not on file    Social Determinants of Health    Financial Resource Strain: Not on file  Food Insecurity: Not on file  Transportation Needs: Not on file  Physical Activity: Not on file  Stress: Not on file  Social Connections: Not on file      Family History: The patient's family history includes COPD in his mother; Cancer in his maternal grandmother; Diabetes in his brother; Heart attack in his father. ROS:   Please see the history of present illness.    All other systems reviewed and are negative.   EKGs/Labs/Other Studies Reviewed:     The following studies were reviewed today:       Recent Labs: 05/26/2022: NT-Pro BNP 469 08/02/2022: ALT 28 08/05/2022: Magnesium 2.4 08/06/2022: BUN 25; Creatinine, Ser 1.02; Hemoglobin 11.0; Platelets 124; Potassium 4.0; Sodium 133  Recent Lipid Panel Labs (Brief)          Component Value Date/Time    CHOL 181 05/26/2022 1444    TRIG 252 (H) 05/26/2022 1444    HDL 39 (L) 05/26/2022 1444    CHOLHDL 4.6 05/26/2022 1444    LDLCALC 99 05/26/2022 1444        Physical Exam:     VS:  BP 124/70 (BP Location: Right Arm, Patient Position: Sitting)   Pulse 100   Ht 6' (1.829 m)   Wt 224 lb (101.6 kg)   SpO2 96%   BMI 30.38 kg/m         Wt Readings from Last 3 Encounters:  08/18/22 224 lb (101.6 kg)  08/10/22 229 lb (103.9 kg)  08/02/22 239 lb (108.4 kg)      GEN: He looks breathless well nourished, well developed in no acute  distress HEENT: Normal NECK: No JVD; No carotid bruits LYMPHATICS: No lymphadenopathy CARDIAC: RRR, no murmurs, rubs, gallops RESPIRATORY: Diminished breath sounds left chest tubular breath sounds right chest posteriorly ABDOMEN: Soft, non-tender, non-distended MUSCULOSKELETAL:  No edema; No deformity  SKIN: Warm and dry NEUROLOGIC:  Alert and oriented x 3 PSYCHIATRIC:  Normal affect    Seen with Darius Bump, CMA chaperone     Signed, Shirlee More, MD  08/18/2022 12:55 PM    [facility]      Addendum: Patient sent to the hospital by Dr. Bettina Gavia today.  See his note above.  He was sent in because of progressive shortness of breath about 2 weeks after bioprosthetic aortic valve replacement.  He was treated with a 27 mm Edwards Inspiris Resilia aortic valve on August 04, 2022.  With his worsening shortness of breath and markedly diminished breath sounds in the left lung, he was sent in for concerns over possible pleural effusion.  However, an echocardiogram performed in the emergency department demonstrates a large pericardial effusion.  Interventional cardiology is consulted to consider urgent pericardiocentesis.  I have personally reviewed the patient's echo images.  He has a large circumferential pericardial effusion best seen in the parasternal and apical windows.  He has poor subcostal windows.  On my exam, he is alert, oriented, in no distress.  His sternotomy site is well-healed.  HEENT is normal, JVP is moderately elevated, lungs are diminished left greater than right, heart is tachycardic and regular with no murmur gallop, abdomen is soft and nontender, extremities have no edema.  I have reviewed the risks, indications, and alternatives to needle pericardiocentesis with the  patient.  He understands risks include but are not limited to cardiac injury, arrhythmia, visceral injury, lung injury, infection, bleeding, and death.  He understands the risks of serious  complications occur at a low frequency of about 1%.  He provides full informed consent for the procedure.  Sherren Mocha 08/18/2022 5:02 PM

## 2022-08-18 NOTE — H&P (Signed)
Expand All Collapse All   Cardiology Office Note:     Date:  08/18/2022    ID:  [name], DOB 1960/12/23, MRN [id number] PCP:  Raina Mina., MD      Cardiologist:  Shirlee More, MD     Referring MD: Raina Mina., MD      ASSESSMENT:     1. SOB (shortness of breath) on exertion   2. S/P AVR (aortic valve replacement)   3. Hypertensive heart disease with chronic diastolic congestive heart failure ([place])   4. Mixed hyperlipidemia     PLAN:     In order of problems listed above:   I suspect this is a postoperative issue with pleural effusion other potential causes would include severe atelectasis or potential of pulmonary embolism no indication of valve dysfunction and he had no postoperative pericardial complication.  Which shortness of breath at rest I think he is best served by EMS transportation. Somewhat confusing picture his weight is down he has no overt findings of heart failure he will require an echocardiogram     Next appointment: To be determined after ED evaluation     Medication Adjustments/Labs and Tests Ordered: Current medicines are reviewed at length with the patient today.  Concerns regarding medicines are outlined above.  No orders of the defined types were placed in this encounter.   No orders of the defined types were placed in this encounter.          Chief Complaint  Patient presents with   Shortness of Breath      History of Present Illness:     Craig Alvarez is a 62 y.o. male with a hx of severe symptomatic aortic stenosis with heart failure hypertension hyperlipidemia bicuspid aortic valve with surgical aortic valve replacement 08/04/2022 with an Edwards 27 mm Inspiris Resilia aortic valve last seen by me preoperatively.  He had an uneventful postoperative course and follow my office saying he was quite short of breath with activity.   Chest x-ray last week 08/09/2022 showed cardiomegaly no pleural effusion or pulmonary infiltrate. Recent labs  08/06/2022 hemoglobin 11.0 platelets 124,000 sodium 133 potassium 4.0 GFR greater than 60 cc/min.   Compliance with diet, lifestyle and medications: Yes   Since he was returned home is becoming increasingly short of breath.  He took a diuretic for 1 day which seemed to help a little however his weight is down 8 pounds since he returned home no fever chills no sputum no wheezing he has been nauseous. His physical exam is abnormal markedly diminished breath sounds left lung and very tubular breath sounds in the right chest and short of breath at rest I suspect this is postoperative may have a large pleural effusion and I think he is best served by going to [facility] ED.  His mother brought him here and he says he feels comfortable making this car trip.        Past Medical History:  Diagnosis Date   Acute respiratory disease due to COVID-19 virus 04/26/2020   Anxiety 01/21/2019   Bicuspid aortic valve     Chronic bilateral low back pain without sciatica 12/12/2017   Depression     Dyslipidemia 12/21/2015   ED (erectile dysfunction) 11/04/2015   Essential hypertension 01/21/2019   Hypercholesteremia     Insomnia 01/21/2019   Mixed hyperlipidemia 01/21/2019   Nonrheumatic aortic valve stenosis 12/21/2015   Prediabetes 11/04/2015   Restless legs syndrome (RLS) 01/21/2019   Tear of meniscus of  knee 12/27/2018               Past Surgical History:  Procedure Laterality Date   AORTIC VALVE REPLACEMENT N/A 08/04/2022    Procedure: AORTIC VALVE REPLACEMENT (AVR) USING EDWARDS 27 MM INSPIRIS RESILIA AORTIC VALVE;  Surgeon: Gaye Pollack, MD;  Location: [place];  Service: Open Heart Surgery;  Laterality: N/A;  Median sternotomy   CARDIAC CATHETERIZATION       KNEE ARTHROSCOPY WITH MENISCAL REPAIR Right 2020   RIGHT HEART CATH AND CORONARY ANGIOGRAPHY N/A 07/13/2022    Procedure: RIGHT HEART CATH AND CORONARY ANGIOGRAPHY;  Surgeon: Early Osmond, MD;  Location: [place] [facility];   Service: Cardiovascular;  Laterality: N/A;   TEE WITHOUT CARDIOVERSION N/A 08/04/2022    Procedure: TRANSESOPHAGEAL ECHOCARDIOGRAM (TEE);  Surgeon: Gaye Pollack, MD;  Location: [place];  Service: Open Heart Surgery;  Laterality: N/A;      Current Medications: Active Medications         Current Meds  Medication Sig   ALPRAZolam (XANAX) 1 MG tablet Take 1 mg by mouth at bedtime as needed for sleep.   amoxicillin (AMOXIL) 500 MG tablet Take 2,000 mg by mouth See admin instructions. Take 2000 mg by mouth1 hour before dental procedures   aspirin 81 MG chewable tablet Chew 81 mg by mouth in the morning.   atorvastatin (LIPITOR) 20 MG tablet Take 20 mg by mouth every evening.   CALCIUM-VITAMIN D PO Take 1 tablet by mouth in the morning.   ezetimibe (ZETIA) 10 MG tablet Take 1 tablet (10 mg total) by mouth daily.   ibuprofen (ADVIL) 200 MG tablet Take 400 mg by mouth every 8 (eight) hours as needed for moderate pain.   lisinopril (ZESTRIL) 20 MG tablet Take 20 mg by mouth in the morning.   loratadine (CLARITIN) 10 MG tablet Take 10 mg by mouth daily as needed for allergies.   metoprolol tartrate (LOPRESSOR) 50 MG tablet Take 1 tablet (50 mg total) by mouth 2 (two) times daily.   Multiple Vitamin (MULTIVITAMIN) tablet Take 1 tablet by mouth in the morning.   Omega-3 1000 MG CAPS Take 1,000 mg by mouth in the morning.   OVER THE COUNTER MEDICATION Take 2 capsules by mouth at bedtime. Relaxium   rOPINIRole (REQUIP) 0.5 MG tablet Take 0.5 mg by mouth at bedtime.   sildenafil (REVATIO) 20 MG tablet Take 60-80 mg by mouth daily as needed (erectile dysfunction).        Allergies:   Patient has no known allergies.    Social History             Socioeconomic History   Marital status: Single      Spouse name: Not on file   Number of children: Not on file   Years of education: Not on file   Highest education level: Not on file  Occupational History   Not on file  Tobacco Use   Smoking status:  Former      Types: Cigarettes      Quit date: 1999      Years since quitting: 25.1   Smokeless tobacco: Never  Vaping Use   Vaping Use: Never used  Substance and Sexual Activity   Alcohol use: Yes      Alcohol/week: 33.0 - 35.0 standard drinks of alcohol      Types: 12 - 14 Glasses of wine, 21 Cans of beer per week   Drug use: Yes  Types: Marijuana      Comment: As of 08/02/22 last marijuana use: 08/01/22   Sexual activity: Not on file  Other Topics Concern   Not on file  Social History Narrative   Not on file    Social Determinants of Health    Financial Resource Strain: Not on file  Food Insecurity: Not on file  Transportation Needs: Not on file  Physical Activity: Not on file  Stress: Not on file  Social Connections: Not on file      Family History: The patient's family history includes COPD in his mother; Cancer in his maternal grandmother; Diabetes in his brother; Heart attack in his father. ROS:   Please see the history of present illness.    All other systems reviewed and are negative.   EKGs/Labs/Other Studies Reviewed:     The following studies were reviewed today:       Recent Labs: 05/26/2022: NT-Pro BNP 469 08/02/2022: ALT 28 08/05/2022: Magnesium 2.4 08/06/2022: BUN 25; Creatinine, Ser 1.02; Hemoglobin 11.0; Platelets 124; Potassium 4.0; Sodium 133  Recent Lipid Panel Labs (Brief)              Component Value Date/Time    CHOL 181 05/26/2022 1444    TRIG 252 (H) 05/26/2022 1444    HDL 39 (L) 05/26/2022 1444    CHOLHDL 4.6 05/26/2022 1444    LDLCALC 99 05/26/2022 1444        Physical Exam:     VS:  BP 124/70 (BP Location: Right Arm, Patient Position: Sitting)   Pulse 100   Ht 6' (1.829 m)   Wt 224 lb (101.6 kg)   SpO2 96%   BMI 30.38 kg/m           Wt Readings from Last 3 Encounters:  08/18/22 224 lb (101.6 kg)  08/10/22 229 lb (103.9 kg)  08/02/22 239 lb (108.4 kg)      GEN: He looks breathless well nourished, well developed in  no acute distress HEENT: Normal NECK: No JVD; No carotid bruits LYMPHATICS: No lymphadenopathy CARDIAC: RRR, no murmurs, rubs, gallops RESPIRATORY: Diminished breath sounds left chest tubular breath sounds right chest posteriorly ABDOMEN: Soft, non-tender, non-distended MUSCULOSKELETAL:  No edema; No deformity  SKIN: Warm and dry NEUROLOGIC:  Alert and oriented x 3 PSYCHIATRIC:  Normal affect    Seen with Craig Alvarez, CMA chaperone     Signed, Shirlee More, MD  08/18/2022 12:55 PM    [facility]       Addendum: Patient sent to the hospital by Dr. Bettina Gavia today.  See his note above.  He was sent in because of progressive shortness of breath about 2 weeks after bioprosthetic aortic valve replacement.  He was treated with a 27 mm Edwards Inspiris Resilia aortic valve on August 04, 2022.  With his worsening shortness of breath and markedly diminished breath sounds in the left lung, he was sent in for concerns over possible pleural effusion.  However, an echocardiogram performed in the emergency department demonstrates a large pericardial effusion.  Interventional cardiology is consulted to consider urgent pericardiocentesis.  I have personally reviewed the patient's echo images.  He has a large circumferential pericardial effusion best seen in the parasternal and apical windows.  He has poor subcostal windows.  On my exam, he is alert, oriented, in no distress.  His sternotomy site is well-healed.  HEENT is normal, JVP is moderately elevated, lungs are diminished left greater than right, heart is tachycardic and  regular with no murmur gallop, abdomen is soft and nontender, extremities have no edema.  I have reviewed the risks, indications, and alternatives to needle pericardiocentesis with the patient.  He understands risks include but are not limited to cardiac injury, arrhythmia, visceral injury, lung injury, infection, bleeding, and death.  He understands the risks of  serious complications occur at a low frequency of about 1%.  He provides full informed consent for the procedure.   Sherren Mocha 08/18/2022 5:02 PM

## 2022-08-18 NOTE — ED Triage Notes (Signed)
Pt present to ED from cardiologist office with c/o shortness of breath worsening since Tuesday. Per EMS, pt noted with diminished lung sounds via auscultation. Per EMS pt noted 95% room air. Pt states shortness of breath worsens when ambulating. Pt c/o right side chest discomfort. Pt A&Ox4 at this time.

## 2022-08-18 NOTE — Patient Instructions (Signed)
This visit was accompanied by Darius Bump, CMA.

## 2022-08-18 NOTE — Plan of Care (Signed)
  Problem: Respiratory: Goal: Respiratory status will improve 08/18/2022 2027 by Olene Craven, RN Outcome: Progressing Note: Pt's breathing is equal, unlabored. Lung sounds Clear/Diminished in bases. Pt's oxygen saturation remains 96 or above on room air while awake/talking/movement in bed.  08/18/2022 2026 by Olene Craven, RN Outcome: Progressing   Problem: Urinary Elimination: Goal: Ability to achieve and maintain adequate renal perfusion and functioning will improve Outcome: Progressing   Problem: Health Behavior/Discharge Planning: Goal: Ability to manage health-related needs will improve Outcome: Progressing

## 2022-08-18 NOTE — Progress Notes (Signed)
  Echocardiogram 2D Echocardiogram has been performed.  Craig Alvarez 08/18/2022, 5:52 PM

## 2022-08-19 ENCOUNTER — Inpatient Hospital Stay (HOSPITAL_COMMUNITY): Payer: 59

## 2022-08-19 DIAGNOSIS — E7849 Other hyperlipidemia: Secondary | ICD-10-CM

## 2022-08-19 DIAGNOSIS — Z952 Presence of prosthetic heart valve: Secondary | ICD-10-CM

## 2022-08-19 DIAGNOSIS — I3139 Other pericardial effusion (noninflammatory): Secondary | ICD-10-CM

## 2022-08-19 DIAGNOSIS — I10 Essential (primary) hypertension: Secondary | ICD-10-CM

## 2022-08-19 LAB — COMPREHENSIVE METABOLIC PANEL
ALT: 29 U/L (ref 0–44)
AST: 21 U/L (ref 15–41)
Albumin: 2.9 g/dL — ABNORMAL LOW (ref 3.5–5.0)
Alkaline Phosphatase: 73 U/L (ref 38–126)
Anion gap: 11 (ref 5–15)
BUN: 18 mg/dL (ref 8–23)
CO2: 22 mmol/L (ref 22–32)
Calcium: 8.7 mg/dL — ABNORMAL LOW (ref 8.9–10.3)
Chloride: 103 mmol/L (ref 98–111)
Creatinine, Ser: 0.99 mg/dL (ref 0.61–1.24)
GFR, Estimated: >60 mL/min (ref >60)
Glucose, Bld: 96 mg/dL (ref 70–99)
Potassium: 4.2 mmol/L (ref 3.5–5.1)
Sodium: 136 mmol/L (ref 135–145)
Total Bilirubin: 1.2 mg/dL (ref 0.3–1.2)
Total Protein: 5.7 g/dL — ABNORMAL LOW (ref 6.5–8.1)

## 2022-08-19 LAB — CBC
HCT: 27.1 % — ABNORMAL LOW (ref 39.0–52.0)
Hemoglobin: 8.9 g/dL — ABNORMAL LOW (ref 13.0–17.0)
MCH: 32 pg (ref 26.0–34.0)
MCHC: 32.8 g/dL (ref 30.0–36.0)
MCV: 97.5 fL (ref 80.0–100.0)
Platelets: 444 10*3/uL — ABNORMAL HIGH (ref 150–400)
RBC: 2.78 MIL/uL — ABNORMAL LOW (ref 4.22–5.81)
RDW: 13.2 % (ref 11.5–15.5)
WBC: 6.7 10*3/uL (ref 4.0–10.5)
nRBC: 0 % (ref 0.0–0.2)

## 2022-08-19 LAB — ECHOCARDIOGRAM LIMITED
Height: 72 in
Weight: 3643.76 oz

## 2022-08-19 MED ORDER — LIDOCAINE HCL (PF) 1 % IJ SOLN
INTRAMUSCULAR | Status: AC
  Filled 2022-08-19: qty 30

## 2022-08-19 MED ORDER — PREDNISONE 10 MG (21) PO TBPK
10.0000 mg | ORAL_TABLET | Freq: Four times a day (QID) | ORAL | Status: DC
  Administered 2022-08-21 – 2022-08-22 (×6): 10 mg via ORAL

## 2022-08-19 MED ORDER — PREDNISONE 10 MG (21) PO TBPK
20.0000 mg | ORAL_TABLET | Freq: Every evening | ORAL | Status: AC
  Administered 2022-08-19: 20 mg via ORAL

## 2022-08-19 MED ORDER — PREDNISONE 10 MG (21) PO TBPK
20.0000 mg | ORAL_TABLET | Freq: Every morning | ORAL | Status: AC
  Administered 2022-08-19: 20 mg via ORAL
  Filled 2022-08-19: qty 21

## 2022-08-19 MED ORDER — PREDNISONE 10 MG (21) PO TBPK
10.0000 mg | ORAL_TABLET | Freq: Three times a day (TID) | ORAL | Status: AC
  Administered 2022-08-20 (×3): 10 mg via ORAL

## 2022-08-19 MED ORDER — PREDNISONE 10 MG (21) PO TBPK
10.0000 mg | ORAL_TABLET | ORAL | Status: AC
  Administered 2022-08-19: 10 mg via ORAL

## 2022-08-19 MED ORDER — PREDNISONE 10 MG (21) PO TBPK
20.0000 mg | ORAL_TABLET | Freq: Every evening | ORAL | Status: AC
  Administered 2022-08-20: 20 mg via ORAL

## 2022-08-19 NOTE — Progress Notes (Signed)
1 Day Post-Op Procedure(s) (LRB): PERICARDIOCENTESIS (N/A) Subjective: Feels much better  Objective: Vital signs in last 24 hours: Temp:  [97.7 F (36.5 C)-98.4 F (36.9 C)] 98.3 F (36.8 C) (02/10 0400) Pulse Rate:  [89-105] 105 (02/10 0700) Cardiac Rhythm: Sinus tachycardia (02/09 2000) Resp:  [16-28] 23 (02/10 0700) BP: (96-147)/(66-122) 118/69 (02/10 0700) SpO2:  [85 %-100 %] 92 % (02/10 0700) Weight:  [99.3 kg-103.3 kg] 103.3 kg (02/10 0400)  Hemodynamic parameters for last 24 hours:    Intake/Output from previous day: 02/09 0701 - 02/10 0700 In: 10 [I.V.:10] Out: 925 [Urine:875; Drains:50] Intake/Output this shift: No intake/output data recorded.  General appearance: alert, cooperative, and no distress Neurologic: intact Heart: regular rate and rhythm Lungs: diminished breath sounds left base  Lab Results: Recent Labs    08/18/22 1421 08/19/22 0549  WBC 7.0 6.7  HGB 9.7* 8.9*  HCT 29.8* 27.1*  PLT 532* 444*   BMET:  Recent Labs    08/18/22 1421 08/19/22 0549  NA 135 136  K 4.3 4.2  CL 101 103  CO2 23 22  GLUCOSE 115* 96  BUN 24* 18  CREATININE 0.98 0.99  CALCIUM 9.3 8.7*    PT/INR: No results for input(s): "LABPROT", "INR" in the last 72 hours. ABG    Component Value Date/Time   PHART 7.368 08/04/2022 1750   HCO3 23.1 08/04/2022 1750   TCO2 24 08/04/2022 1750   ACIDBASEDEF 2.0 08/04/2022 1750   O2SAT 97 08/04/2022 1750   CBG (last 3)  No results for input(s): "GLUCAP" in the last 72 hours.  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

## 2022-08-19 NOTE — Progress Notes (Signed)
Echocardiogram 2D Echocardiogram has been performed.   Oneal Deputy Alim Cattell RDCS 08/19/2022, 11:05 AM

## 2022-08-19 NOTE — Progress Notes (Signed)
      [address]       [place] [address]             [phone]      BP 110/79   Pulse (!) 107   Temp 97.9 F (36.6 C) (Axillary)   Resp (!) 26   Ht 6' (1.829 m)   Wt 103.3 kg   SpO2 93%   BMI 30.89 kg/m    Intake/Output Summary (Last 24 hours) at 08/19/2022 1831 Last data filed at 08/19/2022 1500 Gross per 24 hour  Intake 560 ml  Output 1675 ml  Net -1115 ml   No significant pleural effusion by [place]  Damein Gaunce C. Roxan Hockey, MD [facility] [phone]

## 2022-08-19 NOTE — Progress Notes (Signed)
Progress Note  Patient Name: Craig Alvarez Date of Encounter: 08/19/2022  Primary Cardiologist: None  Subjective   No acute events overnight. Status post pericardiocentesis, 900 ml of bloody fluid drained. Chest x-ray this morning showed left pleural effusion.  Inpatient Medications    Scheduled Meds:  aspirin  81 mg Oral q AM   atorvastatin  20 mg Oral QPM   Chlorhexidine Gluconate Cloth  6 each Topical Q0600   ezetimibe  10 mg Oral Daily   folic acid  1 mg Oral Daily   lisinopril  20 mg Oral q AM   metoprolol tartrate  50 mg Oral BID   multivitamin with minerals  1 tablet Oral q AM   predniSONE  10 mg Oral PC lunch   predniSONE  10 mg Oral PC supper   [START ON 08/20/2022] predniSONE  10 mg Oral 3 x daily with food   [START ON 08/21/2022] predniSONE  10 mg Oral 4X daily taper   predniSONE  20 mg Oral AC breakfast   predniSONE  20 mg Oral Nightly   [START ON 08/20/2022] predniSONE  20 mg Oral Nightly   rOPINIRole  0.5 mg Oral QHS   thiamine  100 mg Oral Daily   Or   thiamine  100 mg Intravenous Daily   Continuous Infusions:  PRN Meds: ALPRAZolam, ibuprofen, loratadine, LORazepam **OR** LORazepam, mouth rinse   Vital Signs    Vitals:   08/19/22 0500 08/19/22 0600 08/19/22 0700 08/19/22 0800  BP: 138/80 110/83 118/69 (!) 161/73  Pulse: 95 97 (!) 105 100  Resp: (!) 24 (!) 24 (!) 23 (!) 22  Temp:      TempSrc:      SpO2: 93% 93% 92% 92%  Weight:      Height:        Intake/Output Summary (Last 24 hours) at 08/19/2022 0842 Last data filed at 08/19/2022 0400 Gross per 24 hour  Intake 10 ml  Output 925 ml  Net -915 ml   Filed Weights   08/18/22 1411 08/19/22 0400  Weight: 99.3 kg 103.3 kg    Telemetry     Personally reviewed, sinus tachycardia, HR 90-1 100s  ECG   Sinus tachycardia  Physical Exam   GEN: No acute distress.   Neck: No JVD. Cardiac: RRR, no murmur, rub, or gallop.  Respiratory: Nonlabored. Clear to auscultation bilaterally. GI: Soft,  nontender, bowel sounds present. MS: No edema; No deformity. Neuro:  Nonfocal. Psych: Alert and oriented x 3. Normal affect.  Labs    Chemistry Recent Labs  Lab 08/18/22 1421 08/19/22 0549  NA 135 136  K 4.3 4.2  CL 101 103  CO2 23 22  GLUCOSE 115* 96  BUN 24* 18  CREATININE 0.98 0.99  CALCIUM 9.3 8.7*  PROT 6.6 5.7*  ALBUMIN 3.2* 2.9*  AST 20 21  ALT 31 29  ALKPHOS 82 73  BILITOT 1.0 1.2  GFRNONAA >60 >60  ANIONGAP 11 11     Hematology Recent Labs  Lab 08/18/22 1421 08/19/22 0549  WBC 7.0 6.7  RBC 3.05* 2.78*  HGB 9.7* 8.9*  HCT 29.8* 27.1*  MCV 97.7 97.5  MCH 31.8 32.0  MCHC 32.6 32.8  RDW 13.2 13.2  PLT 532* 444*    Cardiac Enzymes Recent Labs  Lab 08/18/22 1421  TROPONINIHS 18*    BNP Recent Labs  Lab 08/18/22 1421  BNP 234.2*     DDimerNo results for input(s): "DDIMER" in the last 168 hours.  Radiology    ECHOCARDIOGRAM LIMITED  Result Date: 08/18/2022    ECHOCARDIOGRAM LIMITED REPORT   Patient Name:   Craig Alvarez Date of Exam: 08/18/2022 Medical Rec #:  [id number]    Height:       72.0 in Accession #:    [id number]   Weight:       219.0 lb Date of Birth:  1961/04/21     BSA:          2.214 m Patient Age:    8 years     BP:           131/90 mmHg Patient Gender: M            HR:           97 bpm. Exam Location:  Inpatient Procedure: Limited Echo Indications:    Pericardial effusion  History:        Patient has prior history of Echocardiogram examinations.                 Signs/Symptoms:Shortness of Breath; Risk Factors:Hypertension,                 Dyslipidemia and Former Smoker. Edwards 27 mm Inspiris Resilia                 aortic valve                 replacement. Procedure date 08/04/22.  Sonographer:    Clayton Lefort RDCS (AE) Referring Phys: [place]  1. Limited echo for pericardiocentesis. Initial images show large circumferential pericardial effusion measuring up to 3.5 cm. Final images show significant improvement in size  of effusion, localized adjacent to LV lateral wall measuring 2.2 cm FINDINGS  Pericardium: A large pericardial effusion is present. Oswaldo Milian MD Electronically signed by Oswaldo Milian MD Signature Date/Time: 08/18/2022/6:26:51 PM    Final    CARDIAC CATHETERIZATION  Result Date: 08/18/2022 Successful pericardiocentesis with removal of 900 cc bloody pericardial fluid Plan: Transfer 2H, monitor drain output, leave to suction   ECHOCARDIOGRAM COMPLETE  Result Date: 08/18/2022    ECHOCARDIOGRAM REPORT   Patient Name:   Craig Alvarez Date of Exam: 08/18/2022 Medical Rec #:  [id number]    Height:       72.0 in Accession #:    [id number]   Weight:       219.0 lb Date of Birth:  1961/04/20     BSA:          2.214 m Patient Age:    72 years     BP:           126/77 mmHg Patient Gender: M            HR:           94 bpm. Exam Location:  Inpatient Procedure: 2D Echo, Cardiac Doppler and Color Doppler           REPORT CONTAINS CRITICAL RESULT  Results discussed with Dr Cyndia Bent at 16:15 on 08/18/22. Indications:    Pericardial effusion  History:        Patient has prior history of Echocardiogram examinations, most                 recent 08/04/2022. Aortic Valve Disease, Signs/Symptoms:Shortness                 of Breath; Risk Factors:Hypertension, Dyslipidemia and Former  Smoker. Edwards 27 mm Inspiris Resilia aortic valve replacement.                 Procedure date 08/04/22.  Sonographer:    Clayton Lefort RDCS (AE) Referring Phys: [id number] Gaye Pollack  Sonographer Comments: Technically difficult study due to poor echo windows. IMPRESSIONS  1. Large pericardial effusion, measures up to 3.5 cm. No RV collapse seen, but does have fixed/dilated IVC and significant mitral inflow respiratory variation  2. Left ventricular ejection fraction, by estimation, is 50 to 55%. The left ventricle has low normal function. The left ventricle has no regional wall motion abnormalities. There is severe left ventricular  hypertrophy. Left ventricular diastolic parameters were normal.  3. Right ventricular systolic function is mildly reduced. The right ventricular size is normal. Tricuspid regurgitation signal is inadequate for assessing PA pressure.  4. Left atrial size was mildly dilated.  5. The mitral valve is normal in structure. No evidence of mitral valve regurgitation.  6. There is a 27 mm Edwards Inspiris Resilia valve present in the aortic position     Aortic valve regurgitation is not visualized. Echo findings are consistent with normal structure and function of the aortic valve prosthesis. Aortic valve mean gradient measures 3.0 mmHg.  7. The inferior vena cava is dilated in size with <50% respiratory variability, suggesting right atrial pressure of 15 mmHg. FINDINGS  Left Ventricle: Left ventricular ejection fraction, by estimation, is 50 to 55%. The left ventricle has low normal function. The left ventricle has no regional wall motion abnormalities. The left ventricular internal cavity size was normal in size. There is severe left ventricular hypertrophy. Left ventricular diastolic parameters were normal. Right Ventricle: The right ventricular size is normal. No increase in right ventricular wall thickness. Right ventricular systolic function is mildly reduced. Tricuspid regurgitation signal is inadequate for assessing PA pressure. Left Atrium: Left atrial size was mildly dilated. Right Atrium: Right atrial size was normal in size. Pericardium: A large pericardial effusion is present. Mitral Valve: The mitral valve is normal in structure. No evidence of mitral valve regurgitation. Tricuspid Valve: The tricuspid valve is normal in structure. Tricuspid valve regurgitation is trivial. Aortic Valve: The aortic valve has been repaired/replaced. Aortic valve regurgitation is not visualized. Aortic valve mean gradient measures 3.0 mmHg. Aortic valve peak gradient measures 5.6 mmHg. Aortic valve area, by VTI measures 3.98 cm.  There is a 27 mm Edwards Inspiris Resilia valve present in the aortic position. Echo findings are consistent with normal structure and function of the aortic valve prosthesis. Pulmonic Valve: The pulmonic valve was not well visualized. Pulmonic valve regurgitation is not visualized. Aorta: The aortic root is normal in size and structure. Venous: The inferior vena cava is dilated in size with less than 50% respiratory variability, suggesting right atrial pressure of 15 mmHg. IAS/Shunts: The interatrial septum was not well visualized.  LEFT VENTRICLE PLAX 2D LVIDd:         4.60 cm   Diastology LVIDs:         3.40 cm   LV e' medial:    9.25 cm/s LV PW:         1.70 cm   LV E/e' medial:  7.2 LV IVS:        1.70 cm   LV e' lateral:   10.10 cm/s LVOT diam:     2.60 cm   LV E/e' lateral: 6.6 LV SV:         73 LV SV Index:  33 LVOT Area:     5.31 cm  RIGHT VENTRICLE            IVC RV Basal diam:  3.30 cm    IVC diam: 2.30 cm RV S prime:     6.64 cm/s LEFT ATRIUM             Index        RIGHT ATRIUM           Index LA diam:        3.00 cm 1.35 cm/m   RA Area:     15.70 cm LA Vol (A2C):   46.6 ml 21.04 ml/m  RA Volume:   40.40 ml  18.24 ml/m LA Vol (A4C):   83.1 ml 37.53 ml/m LA Biplane Vol: 62.8 ml 28.36 ml/m  AORTIC VALVE AV Area (Vmax):    3.62 cm AV Area (Vmean):   3.42 cm AV Area (VTI):     3.98 cm AV Vmax:           118.00 cm/s AV Vmean:          83.700 cm/s AV VTI:            0.184 m AV Peak Grad:      5.6 mmHg AV Mean Grad:      3.0 mmHg LVOT Vmax:         80.40 cm/s LVOT Vmean:        53.900 cm/s LVOT VTI:          0.138 m LVOT/AV VTI ratio: 0.75  AORTA Ao Root diam: 3.80 cm MITRAL VALVE MV Area (PHT): 5.09 cm    SHUNTS MV Decel Time: 149 msec    Systemic VTI:  0.14 m MV E velocity: 66.90 cm/s  Systemic Diam: 2.60 cm MV A velocity: 82.50 cm/s MV E/A ratio:  0.81 Oswaldo Milian MD Electronically signed by Oswaldo Milian MD Signature Date/Time: 08/18/2022/4:26:07 PM    Final    DG Chest Port 1  View  Result Date: 08/18/2022 CLINICAL DATA:  Shortness of breath EXAM: PORTABLE CHEST 1 VIEW COMPARISON:  08/09/2022 FINDINGS: Cardiomegaly and aortic valve replacement again noted. New LEFT LOWER lung opacity noted possible LEFT effusion. There is no evidence of pneumothorax. The RIGHT lung is clear. No acute bony abnormalities are present. Remote rib fractures are present. IMPRESSION: 1. New LEFT LOWER lung opacity which may represent atelectasis, consolidation and/or effusion. 2. Cardiomegaly. Electronically Signed   By: Margarette Canada M.D.   On: 08/18/2022 14:33    Cardiac Studies      Assessment & Plan    Patient is a 62 year old M known to have severe aortic valve stenosis from bicuspid aortic valve s/p SAVR (27 mm Edwards Inspiris Resilia aortic valve) in 07/2022 with LVEF 50 to 55%, HTN, HLD presented to the ER with worsening SOB since discharge from the hospital in 08/08/2022.  Echo performed on 08/18/2022 showed a large medical effusion, s/p pericardiocentesis and drained 900 mL of bloody pericardial fluid.  Sternotomy incision healing. Chest x-ray showed left pleural effusion.  # Post pericardiotomy syndrome with large pericardial effusion s/p pericardiocentesis on 08/18/2022 draining 900 mm of bloody pericardial fluid -Pericardial drain output was 94 ml last night and none this morning. Obtain 2D echocardiogram, limited.  # Sinus tachycardia likely secondary to pericardial effusion and left pleural effusion -Sinus tachycardia improved after pericardiocentesis but continues to have HR in between 90 and 100s.. Patient continues to have left pleural effusion which will need  thoracentesis. CT surgery evaluated the patient and recommended ultrasound-guided thoracentesis.  # Severe aortic valve stenosis from bicuspid aortic valve s/p SAVR (27 mm Edwards Inspiris Resilia aortic valve) in 07/2022 with LVEF 50 to 55% -Continue aspirin 81 mg once daily  # HTN, controlled -continue lisinopril 20 mg  once daily -Continue metoprolol tartrate 50 mg twice daily  # HLD -Continue atorvastatin 20 mg nightly Continue Zetia 10 mg once daily  CRITICAL CARE Performed by: Quenten Raven Yandriel Boening   Total critical care time: 45 minutes  Critical care time was exclusive of separately billable procedures and treating other patients.  Critical care was necessary to treat or prevent imminent or life-threatening deterioration.  Critical care was time spent personally by me on the following activities: development of treatment plan with patient and/or surrogate as well as nursing, discussions with consultants, evaluation of patient's response to treatment, examination of patient, obtaining history from patient or surrogate, ordering and performing treatments and interventions, ordering and review of laboratory studies, ordering and review of radiographic studies, pulse oximetry and re-evaluation of patient's condition.   Signed, Chalmers Guest, MD  08/19/2022, 8:42 AM

## 2022-08-20 LAB — COMPREHENSIVE METABOLIC PANEL WITH GFR
ALT: 36 U/L (ref 0–44)
AST: 29 U/L (ref 15–41)
Albumin: 3.1 g/dL — ABNORMAL LOW (ref 3.5–5.0)
Alkaline Phosphatase: 89 U/L (ref 38–126)
Anion gap: 11 (ref 5–15)
BUN: 15 mg/dL (ref 8–23)
CO2: 19 mmol/L — ABNORMAL LOW (ref 22–32)
Calcium: 8.8 mg/dL — ABNORMAL LOW (ref 8.9–10.3)
Chloride: 106 mmol/L (ref 98–111)
Creatinine, Ser: 0.88 mg/dL (ref 0.61–1.24)
GFR, Estimated: >60 mL/min
Glucose, Bld: 123 mg/dL — ABNORMAL HIGH (ref 70–99)
Potassium: 3.7 mmol/L (ref 3.5–5.1)
Sodium: 136 mmol/L (ref 135–145)
Total Bilirubin: 0.6 mg/dL (ref 0.3–1.2)
Total Protein: 6.5 g/dL (ref 6.5–8.1)

## 2022-08-20 LAB — CBC
HCT: 32.1 % — ABNORMAL LOW (ref 39.0–52.0)
Hemoglobin: 10.7 g/dL — ABNORMAL LOW (ref 13.0–17.0)
MCH: 32.1 pg (ref 26.0–34.0)
MCHC: 33.3 g/dL (ref 30.0–36.0)
MCV: 96.4 fL (ref 80.0–100.0)
Platelets: 606 10*3/uL — ABNORMAL HIGH (ref 150–400)
RBC: 3.33 MIL/uL — ABNORMAL LOW (ref 4.22–5.81)
RDW: 13.2 % (ref 11.5–15.5)
WBC: 8.7 10*3/uL (ref 4.0–10.5)
nRBC: 0 % (ref 0.0–0.2)

## 2022-08-20 MED ORDER — COLCHICINE 0.6 MG PO TABS
0.6000 mg | ORAL_TABLET | Freq: Two times a day (BID) | ORAL | Status: DC
  Administered 2022-08-20 – 2022-08-22 (×5): 0.6 mg via ORAL
  Filled 2022-08-20 (×5): qty 1

## 2022-08-20 MED ORDER — CHLORHEXIDINE GLUCONATE CLOTH 2 % EX PADS
6.0000 | MEDICATED_PAD | Freq: Every day | CUTANEOUS | Status: DC
  Administered 2022-08-20 – 2022-08-22 (×3): 6 via TOPICAL

## 2022-08-20 MED ORDER — PNEUMOCOCCAL 20-VAL CONJ VACC 0.5 ML IM SUSY: 0.5000 mL | PREFILLED_SYRINGE | INTRAMUSCULAR | Status: DC | PRN

## 2022-08-20 MED ORDER — FENTANYL CITRATE PF 50 MCG/ML IJ SOSY
50.0000 ug | PREFILLED_SYRINGE | Freq: Once | INTRAMUSCULAR | Status: AC
  Administered 2022-08-20: 50 ug via INTRAVENOUS
  Filled 2022-08-20: qty 1

## 2022-08-20 NOTE — Progress Notes (Signed)
      [address]       [place] [address]             [phone]      BP (!) 122/106 (BP Location: Left Arm)   Pulse 96   Temp 98.6 F (37 C) (Oral)   Resp 16   Ht 6' (1.829 m)   Wt 103.3 kg   SpO2 95%   BMI 30.89 kg/m    Intake/Output Summary (Last 24 hours) at 08/20/2022 1242 Last data filed at 08/20/2022 1200 Gross per 24 hour  Intake 940 ml  Output 3050 ml  Net -2110 ml   Minimal output from drain  Echo yesterday showed some residual undrained fluid posterior to the left ventricle.  D/w Dr. Burt Knack.  At this point I do not think there is enough fluid to warrant a window.  Agree with removing drain.  Would treat for pericarditis and repeat an echo in about a week.   Revonda Standard Roxan Hockey, MD [facility] [phone]

## 2022-08-20 NOTE — Progress Notes (Addendum)
Progress Note  Patient Name: Craig Alvarez Date of Encounter: 08/20/2022  Primary Cardiologist: None  Subjective   No acute events overnight. Status post pericardiocentesis, 900 ml of bloody fluid drained. Chest x-ray from 08/19/2022 showed left pleural effusion but ultrasound lungs showed minimal left pleural effusion which cannot be drained at all.  Limited echocardiogram from 08/19/2022 showed no effusion anterior to the right ventricle but there was a large pericardial effusion lateral to the left ventricle.  Patient reports SOB at nighttime and also breathing hard at night. Otherwise he did not ambulate much since admission.  Telemetry reviewed, sinus tachycardia, heart rate in 100s.  Inpatient Medications    Scheduled Meds:  aspirin  81 mg Oral q AM   atorvastatin  20 mg Oral QPM   Chlorhexidine Gluconate Cloth  6 each Topical Daily   ezetimibe  10 mg Oral Daily   folic acid  1 mg Oral Daily   lisinopril  20 mg Oral q AM   metoprolol tartrate  50 mg Oral BID   multivitamin with minerals  1 tablet Oral q AM   predniSONE  10 mg Oral 3 x daily with food   [START ON 08/21/2022] predniSONE  10 mg Oral 4X daily taper   predniSONE  20 mg Oral Nightly   rOPINIRole  0.5 mg Oral QHS   thiamine  100 mg Oral Daily   Or   thiamine  100 mg Intravenous Daily   Continuous Infusions:  PRN Meds: ALPRAZolam, ibuprofen, loratadine, LORazepam **OR** LORazepam, mouth rinse   Vital Signs    Vitals:   08/20/22 0200 08/20/22 0300 08/20/22 0400 08/20/22 0500  BP: (!) 120/90 (!) 132/97 (!) 142/93 (!) 126/97  Pulse: 99 (!) 101 94 99  Resp: (!) 21 (!) 23 (!) 23 (!) 21  Temp:   98.6 F (37 C)   TempSrc:   Oral   SpO2: 93% 93% 93% 94%  Weight:      Height:        Intake/Output Summary (Last 24 hours) at 08/20/2022 0704 Last data filed at 08/20/2022 0555 Gross per 24 hour  Intake 790 ml  Output 3150 ml  Net -2360 ml   Filed Weights   08/18/22 1411 08/19/22 0400  Weight: 99.3 kg 103.3  kg    Telemetry     Personally reviewed, sinus tachycardia, HR 90-1 100s  ECG   Sinus tachycardia  Physical Exam   GEN: No acute distress.   Neck: No JVD. Cardiac: RRR, no murmur, rub, or gallop.  Respiratory: Nonlabored. Clear to auscultation bilaterally. GI: Soft, nontender, bowel sounds present. MS: No edema; No deformity. Neuro:  Nonfocal. Psych: Alert and oriented x 3. Normal affect.  Labs    Chemistry Recent Labs  Lab 08/18/22 1421 08/19/22 0549  NA 135 136  K 4.3 4.2  CL 101 103  CO2 23 22  GLUCOSE 115* 96  BUN 24* 18  CREATININE 0.98 0.99  CALCIUM 9.3 8.7*  PROT 6.6 5.7*  ALBUMIN 3.2* 2.9*  AST 20 21  ALT 31 29  ALKPHOS 82 73  BILITOT 1.0 1.2  GFRNONAA >60 >60  ANIONGAP 11 11     Hematology Recent Labs  Lab 08/18/22 1421 08/19/22 0549  WBC 7.0 6.7  RBC 3.05* 2.78*  HGB 9.7* 8.9*  HCT 29.8* 27.1*  MCV 97.7 97.5  MCH 31.8 32.0  MCHC 32.6 32.8  RDW 13.2 13.2  PLT 532* 444*    Cardiac Enzymes Recent Labs  Lab 08/18/22  1421  TROPONINIHS 18*    BNP Recent Labs  Lab 08/18/22 1421  BNP 234.2*     DDimerNo results for input(s): "DDIMER" in the last 168 hours.   Radiology    ECHOCARDIOGRAM LIMITED  Result Date: 08/19/2022    ECHOCARDIOGRAM LIMITED REPORT   Patient Name:   Craig Alvarez Date of Exam: 08/19/2022 Medical Rec #:  [id number]    Height:       72.0 in Accession #:    [id number]   Weight:       227.7 lb Date of Birth:  1960/09/13     BSA:          2.251 m Patient Age:    62 years     BP:           116/85 mmHg Patient Gender: M            HR:           93 bpm. Exam Location:  Inpatient Procedure: Limited Echo Indications:    I31.3 Pericardial effusion  History:        Patient has prior history of Echocardiogram examinations, most                 recent 08/18/2022. Risk Factors:Hypertension and Dyslipidemia.                 Aortic Valve: 27 mm Edwards Inspiris Resilia valve is present in                 the aortic position. Procedure  Date: 08/04/22.  Sonographer:    Raquel Sarna Senior RDCS Referring Phys: Quenten Raven Cerys Winget  Sonographer Comments: Limited to recheck effusion (pericardial drain in place) IMPRESSIONS  1. Limited Echo to assess pericardial effusion post-pericardiocentesis  2. There is no pericardial effusion anterior to the right ventricle. There is a large pericardial effusion adjacent to the lateral wall of left ventricle, improved from 3.6 cm on admission to 2.5 cm now. FINDINGS  Pericardium: There is no pericardial effusion anterior to the right ventricle. There is a large pericardial effusion adjacent to the lateral wall of left ventricle, improved from 3.6 cm on admission to 2.5 cm now. Aortic Valve: Marland Kitchen Athina Fahey Priya Reta Norgren Electronically signed by Lorelee Cover Hetal Proano Signature Date/Time: 08/19/2022/2:51:39 PM    Final    [place] CHEST (PLEURAL EFFUSION)  Result Date: 08/19/2022 CLINICAL DATA:  Left effusion, assess for thoracentesis EXAM: CHEST ULTRASOUND COMPARISON:  08/19/2022 FINDINGS: Ultrasound performed of the left posterior chest. There is a very small left effusion with left lower lobe collapse/consolidation visualized. There is not enough fluid to warrant therapeutic thoracentesis. Procedure not performed. IMPRESSION: Trace left effusion by ultrasound. Electronically Signed   By: Jerilynn Mages.  Shick M.D.   On: 08/19/2022 10:37   DG Chest Port 1 View  Result Date: 08/19/2022 CLINICAL DATA:  Pleural effusion EXAM: PORTABLE CHEST 1 VIEW COMPARISON:  08/18/2022 FINDINGS: No focal consolidation. No pneumothorax. Probable small left pleural effusion and atelectasis. Stable cardiomegaly. Prior aortic valve replacement. Prior median sternotomy. No acute osseous abnormality. IMPRESSION: 1. Probable small left pleural effusion and atelectasis. Electronically Signed   By: Kathreen Devoid M.D.   On: 08/19/2022 09:21   ECHOCARDIOGRAM LIMITED  Result Date: 08/18/2022    ECHOCARDIOGRAM LIMITED REPORT   Patient Name:   Craig Alvarez Date of  Exam: 08/18/2022 Medical Rec #:  [id number]    Height:       72.0 in Accession #:    [id number]  Weight:       219.0 lb Date of Birth:  08/24/1960     BSA:          2.214 m Patient Age:    62 years     BP:           131/90 mmHg Patient Gender: M            HR:           97 bpm. Exam Location:  Inpatient Procedure: Limited Echo Indications:    Pericardial effusion  History:        Patient has prior history of Echocardiogram examinations.                 Signs/Symptoms:Shortness of Breath; Risk Factors:Hypertension,                 Dyslipidemia and Former Smoker. Edwards 27 mm Inspiris Resilia                 aortic valve                 replacement. Procedure date 08/04/22.  Sonographer:    Clayton Lefort RDCS (AE) Referring Phys: [place]  1. Limited echo for pericardiocentesis. Initial images show large circumferential pericardial effusion measuring up to 3.5 cm. Final images show significant improvement in size of effusion, localized adjacent to LV lateral wall measuring 2.2 cm FINDINGS  Pericardium: A large pericardial effusion is present. Oswaldo Milian MD Electronically signed by Oswaldo Milian MD Signature Date/Time: 08/18/2022/6:26:51 PM    Final    CARDIAC CATHETERIZATION  Result Date: 08/18/2022 Successful pericardiocentesis with removal of 900 cc bloody pericardial fluid Plan: Transfer 2H, monitor drain output, leave to suction   ECHOCARDIOGRAM COMPLETE  Result Date: 08/18/2022    ECHOCARDIOGRAM REPORT   Patient Name:   JACORIEN SIPLE Date of Exam: 08/18/2022 Medical Rec #:  [id number]    Height:       72.0 in Accession #:    [id number]   Weight:       219.0 lb Date of Birth:  04/13/1961     BSA:          2.214 m Patient Age:    76 years     BP:           126/77 mmHg Patient Gender: M            HR:           94 bpm. Exam Location:  Inpatient Procedure: 2D Echo, Cardiac Doppler and Color Doppler           REPORT CONTAINS CRITICAL RESULT  Results discussed with Dr Cyndia Bent at 16:15 on  08/18/22. Indications:    Pericardial effusion  History:        Patient has prior history of Echocardiogram examinations, most                 recent 08/04/2022. Aortic Valve Disease, Signs/Symptoms:Shortness                 of Breath; Risk Factors:Hypertension, Dyslipidemia and Former                 Smoker. Edwards 27 mm Inspiris Resilia aortic valve replacement.                 Procedure date 08/04/22.  Sonographer:    Clayton Lefort RDCS (AE) Referring Phys: [id number] Gaye Pollack  Sonographer Comments: Technically difficult study  due to poor echo windows. IMPRESSIONS  1. Large pericardial effusion, measures up to 3.5 cm. No RV collapse seen, but does have fixed/dilated IVC and significant mitral inflow respiratory variation  2. Left ventricular ejection fraction, by estimation, is 50 to 55%. The left ventricle has low normal function. The left ventricle has no regional wall motion abnormalities. There is severe left ventricular hypertrophy. Left ventricular diastolic parameters were normal.  3. Right ventricular systolic function is mildly reduced. The right ventricular size is normal. Tricuspid regurgitation signal is inadequate for assessing PA pressure.  4. Left atrial size was mildly dilated.  5. The mitral valve is normal in structure. No evidence of mitral valve regurgitation.  6. There is a 27 mm Edwards Inspiris Resilia valve present in the aortic position     Aortic valve regurgitation is not visualized. Echo findings are consistent with normal structure and function of the aortic valve prosthesis. Aortic valve mean gradient measures 3.0 mmHg.  7. The inferior vena cava is dilated in size with <50% respiratory variability, suggesting right atrial pressure of 15 mmHg. FINDINGS  Left Ventricle: Left ventricular ejection fraction, by estimation, is 50 to 55%. The left ventricle has low normal function. The left ventricle has no regional wall motion abnormalities. The left ventricular internal cavity size was normal  in size. There is severe left ventricular hypertrophy. Left ventricular diastolic parameters were normal. Right Ventricle: The right ventricular size is normal. No increase in right ventricular wall thickness. Right ventricular systolic function is mildly reduced. Tricuspid regurgitation signal is inadequate for assessing PA pressure. Left Atrium: Left atrial size was mildly dilated. Right Atrium: Right atrial size was normal in size. Pericardium: A large pericardial effusion is present. Mitral Valve: The mitral valve is normal in structure. No evidence of mitral valve regurgitation. Tricuspid Valve: The tricuspid valve is normal in structure. Tricuspid valve regurgitation is trivial. Aortic Valve: The aortic valve has been repaired/replaced. Aortic valve regurgitation is not visualized. Aortic valve mean gradient measures 3.0 mmHg. Aortic valve peak gradient measures 5.6 mmHg. Aortic valve area, by VTI measures 3.98 cm. There is a 27 mm Edwards Inspiris Resilia valve present in the aortic position. Echo findings are consistent with normal structure and function of the aortic valve prosthesis. Pulmonic Valve: The pulmonic valve was not well visualized. Pulmonic valve regurgitation is not visualized. Aorta: The aortic root is normal in size and structure. Venous: The inferior vena cava is dilated in size with less than 50% respiratory variability, suggesting right atrial pressure of 15 mmHg. IAS/Shunts: The interatrial septum was not well visualized.  LEFT VENTRICLE PLAX 2D LVIDd:         4.60 cm   Diastology LVIDs:         3.40 cm   LV e' medial:    9.25 cm/s LV PW:         1.70 cm   LV E/e' medial:  7.2 LV IVS:        1.70 cm   LV e' lateral:   10.10 cm/s LVOT diam:     2.60 cm   LV E/e' lateral: 6.6 LV SV:         73 LV SV Index:   33 LVOT Area:     5.31 cm  RIGHT VENTRICLE            IVC RV Basal diam:  3.30 cm    IVC diam: 2.30 cm RV S prime:     6.64 cm/s LEFT ATRIUM  Index        RIGHT ATRIUM            Index LA diam:        3.00 cm 1.35 cm/m   RA Area:     15.70 cm LA Vol (A2C):   46.6 ml 21.04 ml/m  RA Volume:   40.40 ml  18.24 ml/m LA Vol (A4C):   83.1 ml 37.53 ml/m LA Biplane Vol: 62.8 ml 28.36 ml/m  AORTIC VALVE AV Area (Vmax):    3.62 cm AV Area (Vmean):   3.42 cm AV Area (VTI):     3.98 cm AV Vmax:           118.00 cm/s AV Vmean:          83.700 cm/s AV VTI:            0.184 m AV Peak Grad:      5.6 mmHg AV Mean Grad:      3.0 mmHg LVOT Vmax:         80.40 cm/s LVOT Vmean:        53.900 cm/s LVOT VTI:          0.138 m LVOT/AV VTI ratio: 0.75  AORTA Ao Root diam: 3.80 cm MITRAL VALVE MV Area (PHT): 5.09 cm    SHUNTS MV Decel Time: 149 msec    Systemic VTI:  0.14 m MV E velocity: 66.90 cm/s  Systemic Diam: 2.60 cm MV A velocity: 82.50 cm/s MV E/A ratio:  0.81 Oswaldo Milian MD Electronically signed by Oswaldo Milian MD Signature Date/Time: 08/18/2022/4:26:07 PM    Final    DG Chest Port 1 View  Result Date: 08/18/2022 CLINICAL DATA:  Shortness of breath EXAM: PORTABLE CHEST 1 VIEW COMPARISON:  08/09/2022 FINDINGS: Cardiomegaly and aortic valve replacement again noted. New LEFT LOWER lung opacity noted possible LEFT effusion. There is no evidence of pneumothorax. The RIGHT lung is clear. No acute bony abnormalities are present. Remote rib fractures are present. IMPRESSION: 1. New LEFT LOWER lung opacity which may represent atelectasis, consolidation and/or effusion. 2. Cardiomegaly. Electronically Signed   By: Margarette Canada M.D.   On: 08/18/2022 14:33    Cardiac Studies      Assessment & Plan    Patient is a 62 year old M known to have severe aortic valve stenosis from bicuspid aortic valve s/p SAVR (27 mm Edwards Inspiris Resilia aortic valve) in 07/2022 with LVEF 50 to 55%, HTN, HLD presented to the ER with worsening SOB since discharge from the hospital in 08/08/2022.  Echo performed on 08/18/2022 showed a large medical effusion, s/p pericardiocentesis and drained 900 mL of  bloody pericardial fluid.  Sternotomy incision healing. Chest x-ray showed left pleural effusion with ultrasound lungs showed minimal left pleural effusion which is not amenable to thoracentesis.  Limited echocardiogram on 08/19/2022 showed no effusion anterior to the right ventricle but there was a large pericardial effusion lateral to the left ventricle.  # Post pericardiotomy syndrome with large pericardial effusion s/p pericardiocentesis on 08/18/2022 draining 900 mm of bloody pericardial fluid -Pericardial drain output was less than 5 ml.  Limited echocardiogram obtained on 08/19/2022 showed no pericardial effusion anterior to RV but there was a large pericardial effusion lateral to the left ventricle which decreased in size from 3.6 cm to 2.5 cm. Follow-up with cardiothoracic surgery for further recommendations on pericardial window. -Prednisone tapering regimen per CTS and colchicine 0.6 mg BID  # Sinus tachycardia likely secondary to large pericardial effusion lateral  to the left ventricle -Sinus tachycardia improved after pericardiocentesis but continues to have HR in 100s. Ultrasound of lungs showed minimal left pleural effusion not amenable to thoracentesis.  Limited echocardiogram from 08/19/2022 showed large pericardial fusion lateral to the left ventricle. Follow-up with cardiothoracic surgery for further recommendations on pericardial window.  # Severe aortic valve stenosis from bicuspid aortic valve s/p SAVR (27 mm Edwards Inspiris Resilia aortic valve) in 07/2022 with LVEF 50 to 55% -Continue aspirin 81 mg once daily  # HTN, controlled -Continue lisinopril 20 mg once daily -Continue metoprolol tartrate 50 mg twice daily  # HLD -Continue atorvastatin 20 mg nightly -Continue Zetia 10 mg once daily  CRITICAL CARE Performed by: Quenten Raven Carrianne Hyun   Total critical care time: 45 minutes  Critical care time was exclusive of separately billable procedures and treating other  patients.  Critical care was necessary to treat or prevent imminent or life-threatening deterioration.  Critical care was time spent personally by me on the following activities: development of treatment plan with patient and/or surrogate as well as nursing, discussions with consultants, evaluation of patient's response to treatment, examination of patient, obtaining history from patient or surrogate, ordering and performing treatments and interventions, ordering and review of laboratory studies, ordering and review of radiographic studies, pulse oximetry and re-evaluation of patient's condition.   Signed, Chalmers Guest, MD  08/20/2022, 7:04 AM

## 2022-08-21 ENCOUNTER — Encounter (HOSPITAL_COMMUNITY): Payer: Self-pay | Admitting: Cardiovascular Disease

## 2022-08-21 DIAGNOSIS — I3139 Other pericardial effusion (noninflammatory): Secondary | ICD-10-CM

## 2022-08-21 LAB — LD, BODY FLUID (OTHER): LD, Body Fluid: 975 IU/L

## 2022-08-21 LAB — GLUCOSE, BODY FLUID OTHER: Glucose, Body Fluid Other: 24 mg/dL

## 2022-08-21 LAB — PROTEIN, BODY FLUID (OTHER): Total Protein, Body Fluid Other: 5 g/dL

## 2022-08-21 MED ORDER — ENOXAPARIN SODIUM 40 MG/0.4ML IJ SOSY
40.0000 mg | PREFILLED_SYRINGE | INTRAMUSCULAR | Status: DC
  Administered 2022-08-21 – 2022-08-22 (×2): 40 mg via SUBCUTANEOUS
  Filled 2022-08-21 (×2): qty 0.4

## 2022-08-21 NOTE — Progress Notes (Signed)
3 Days Post-Op Procedure(s) (LRB): PERICARDIOCENTESIS (N/A) Subjective: He feels much better since pericardial fluid drained. Follow up echo shows some remaining fluid posterolaterally. I reviewed the echo and I think this is a relatively small residual effusion and not enough to warrant pericardial window.   Objective: Vital signs in last 24 hours: Temp:  [97.7 F (36.5 C)-98.6 F (37 C)] 97.7 F (36.5 C) (02/12 0755) Pulse Rate:  [73-113] 96 (02/12 0800) Cardiac Rhythm: Normal sinus rhythm (02/12 0800) Resp:  [16-29] 21 (02/12 0800) BP: (106-167)/(71-134) 133/83 (02/12 0800) SpO2:  [90 %-97 %] 93 % (02/12 0800) Weight:  [96.2 kg] 96.2 kg (02/12 0645)  Hemodynamic parameters for last 24 hours:    Intake/Output from previous day: 02/11 0701 - 02/12 0700 In: 640 [P.O.:640] Out: 1475 [Urine:1475] Intake/Output this shift: Total I/O In: 240 [P.O.:240] Out: -   General appearance: alert and cooperative Neurologic: intact Heart: regular rate and rhythm Lungs: diminished breath sounds bibasilar, left >right Extremities: no edema Wound: incision healing well.  Lab Results: Recent Labs    08/19/22 0549 08/20/22 0801  WBC 6.7 8.7  HGB 8.9* 10.7*  HCT 27.1* 32.1*  PLT 444* 606*   BMET:  Recent Labs    08/19/22 0549 08/20/22 0801  NA 136 136  K 4.2 3.7  CL 103 106  CO2 22 19*  GLUCOSE 96 123*  BUN 18 15  CREATININE 0.99 0.88  CALCIUM 8.7* 8.8*    PT/INR: No results for input(s): "LABPROT", "INR" in the last 72 hours. ABG    Component Value Date/Time   PHART 7.368 08/04/2022 1750   HCO3 23.1 08/04/2022 1750   TCO2 24 08/04/2022 1750   ACIDBASEDEF 2.0 08/04/2022 1750   O2SAT 97 08/04/2022 1750   CBG (last 3)  No results for input(s): "GLUCAP" in the last 72 hours.  Assessment/Plan: S/P Procedure(s) (LRB): PERICARDIOCENTESIS (N/A)  He is doing well overall. I think the residual effusion can be followed with an echo in a week or two. He still has  significant atelectasis of LLL. [place] did not show much left pleural effusion. He needs to continue IS, ambulation to resolve that.    LOS: 3 days    Gaye Pollack 08/21/2022

## 2022-08-21 NOTE — Progress Notes (Signed)
[facility] Progress Note: 3 Days Post-Op Procedure(s) (LRB): PERICARDIOCENTESIS (N/A)  LOS: 3 days   Looks well  Talkative  Sitting in chair     Latest Ref Rng & Units 08/20/2022    8:01 AM 08/19/2022    5:49 AM 08/18/2022    2:21 PM  CBC  WBC 4.0 - 10.5 K/uL 8.7  6.7  7.0   Hemoglobin 13.0 - 17.0 g/dL 10.7  8.9  9.7   Hematocrit 39.0 - 52.0 % 32.1  27.1  29.8   Platelets 150 - 400 K/uL 606  444  532        Latest Ref Rng & Units 08/20/2022    8:01 AM 08/19/2022    5:49 AM 08/18/2022    2:21 PM  CMP  Glucose 70 - 99 mg/dL 123  96  115   BUN 8 - 23 mg/dL 15  18  24   $ Creatinine 0.61 - 1.24 mg/dL 0.88  0.99  0.98   Sodium 135 - 145 mmol/L 136  136  135   Potassium 3.5 - 5.1 mmol/L 3.7  4.2  4.3   Chloride 98 - 111 mmol/L 106  103  101   CO2 22 - 32 mmol/L 19  22  23   $ Calcium 8.9 - 10.3 mg/dL 8.8  8.7  9.3   Total Protein 6.5 - 8.1 g/dL 6.5  5.7  6.6   Total Bilirubin 0.3 - 1.2 mg/dL 0.6  1.2  1.0   Alkaline Phos 38 - 126 U/L 89  73  82   AST 15 - 41 U/L 29  21  20   $ ALT 0 - 44 U/L 36  29  31     ABG    Component Value Date/Time   PHART 7.368 08/04/2022 1750   PCO2ART 40.2 08/04/2022 1750   PO2ART 93 08/04/2022 1750   HCO3 23.1 08/04/2022 1750   TCO2 24 08/04/2022 1750   ACIDBASEDEF 2.0 08/04/2022 1750   O2SAT 97 08/04/2022 1750

## 2022-08-21 NOTE — Progress Notes (Signed)
Rounding Note    Patient Name: Craig Alvarez Date of Encounter: 08/21/2022  [place] Cardiologist: None   Subjective   Feeling well this morning.  Has not been out of bed much but has ambulated in the room.  Some fatigue and shortness of breath with ambulation noted.  No chest pain.  Inpatient Medications    Scheduled Meds:  aspirin  81 mg Oral q AM   atorvastatin  20 mg Oral QPM   Chlorhexidine Gluconate Cloth  6 each Topical Daily   colchicine  0.6 mg Oral BID   enoxaparin (LOVENOX) injection  40 mg Subcutaneous Q24H   ezetimibe  10 mg Oral Daily   folic acid  1 mg Oral Daily   lisinopril  20 mg Oral q AM   metoprolol tartrate  50 mg Oral BID   multivitamin with minerals  1 tablet Oral q AM   predniSONE  10 mg Oral 4X daily taper   rOPINIRole  0.5 mg Oral QHS   thiamine  100 mg Oral Daily   Or   thiamine  100 mg Intravenous Daily   Continuous Infusions:  PRN Meds: ALPRAZolam, ibuprofen, loratadine, LORazepam **OR** LORazepam, mouth rinse, [START ON 08/22/2022] pneumococcal 20-valent conjugate vaccine   Vital Signs    Vitals:   08/21/22 0500 08/21/22 0600 08/21/22 0645 08/21/22 0700  BP: 121/74 138/85    Pulse: 73 86 89 89  Resp: (!) 21 (!) 21 (!) 29 16  Temp:      TempSrc:      SpO2: 94% 94%  94%  Weight:   96.2 kg   Height:        Intake/Output Summary (Last 24 hours) at 08/21/2022 0749 Last data filed at 08/21/2022 0600 Gross per 24 hour  Intake 640 ml  Output 1475 ml  Net -835 ml      08/21/2022    6:45 AM 08/19/2022    4:00 AM 08/18/2022    2:11 PM  Last 3 Weights  Weight (lbs) 212 lb 1.3 oz 227 lb 11.8 oz 219 lb  Weight (kg) 96.2 kg 103.3 kg 99.338 kg      Telemetry    Sinus rhythm/sinus tachycardia- Personally Reviewed   Physical Exam  Alert, oriented, no distress GEN: No acute distress.   Neck: No JVD Cardiac: RRR, no murmurs, rubs, or gallops.  Respiratory: Diminished breath sounds left lower lung fields GI: Soft,  nontender, non-distended  MS: No edema; No deformity. Neuro:  Nonfocal  Psych: Normal affect   Labs    High Sensitivity Troponin:   Recent Labs  Lab 08/18/22 1421  TROPONINIHS 18*     Chemistry Recent Labs  Lab 08/18/22 1421 08/19/22 0549 08/20/22 0801  NA 135 136 136  K 4.3 4.2 3.7  CL 101 103 106  CO2 23 22 19*  GLUCOSE 115* 96 123*  BUN 24* 18 15  CREATININE 0.98 0.99 0.88  CALCIUM 9.3 8.7* 8.8*  PROT 6.6 5.7* 6.5  ALBUMIN 3.2* 2.9* 3.1*  AST 20 21 29  $ ALT 31 29 36  ALKPHOS 82 73 89  BILITOT 1.0 1.2 0.6  GFRNONAA >60 >60 >60  ANIONGAP 11 11 11    $ Lipids No results for input(s): "CHOL", "TRIG", "HDL", "LABVLDL", "LDLCALC", "CHOLHDL" in the last 168 hours.  Hematology Recent Labs  Lab 08/18/22 1421 08/19/22 0549 08/20/22 0801  WBC 7.0 6.7 8.7  RBC 3.05* 2.78* 3.33*  HGB 9.7* 8.9* 10.7*  HCT 29.8* 27.1* 32.1*  MCV  97.7 97.5 96.4  MCH 31.8 32.0 32.1  MCHC 32.6 32.8 33.3  RDW 13.2 13.2 13.2  PLT 532* 444* 606*   Thyroid No results for input(s): "TSH", "FREET4" in the last 168 hours.  BNP Recent Labs  Lab 08/18/22 1421  BNP 234.2*    DDimer No results for input(s): "DDIMER" in the last 168 hours.   Radiology    ECHOCARDIOGRAM LIMITED  Result Date: 08/19/2022    ECHOCARDIOGRAM LIMITED REPORT   Patient Name:   Craig Alvarez Date of Exam: 08/19/2022 Medical Rec #:  [id number]    Height:       72.0 in Accession #:    [id number]   Weight:       227.7 lb Date of Birth:  22-Feb-1961     BSA:          2.251 m Patient Age:    4 years     BP:           116/85 mmHg Patient Gender: M            HR:           93 bpm. Exam Location:  Inpatient Procedure: Limited Echo Indications:    I31.3 Pericardial effusion  History:        Patient has prior history of Echocardiogram examinations, most                 recent 08/18/2022. Risk Factors:Hypertension and Dyslipidemia.                 Aortic Valve: 27 mm Edwards Inspiris Resilia valve is present in                 the aortic  position. Procedure Date: 08/04/22.  Sonographer:    Raquel Sarna Senior RDCS Referring Phys: Quenten Raven MALLIPEDDI  Sonographer Comments: Limited to recheck effusion (pericardial drain in place) IMPRESSIONS  1. Limited Echo to assess pericardial effusion post-pericardiocentesis  2. There is no pericardial effusion anterior to the right ventricle. There is a large pericardial effusion adjacent to the lateral wall of left ventricle, improved from 3.6 cm on admission to 2.5 cm now. FINDINGS  Pericardium: There is no pericardial effusion anterior to the right ventricle. There is a large pericardial effusion adjacent to the lateral wall of left ventricle, improved from 3.6 cm on admission to 2.5 cm now. Aortic Valve: Marland Kitchen Vishnu Priya Mallipeddi Electronically signed by Lorelee Cover Mallipeddi Signature Date/Time: 08/19/2022/2:51:39 PM    Final    [place] CHEST (PLEURAL EFFUSION)  Result Date: 08/19/2022 CLINICAL DATA:  Left effusion, assess for thoracentesis EXAM: CHEST ULTRASOUND COMPARISON:  08/19/2022 FINDINGS: Ultrasound performed of the left posterior chest. There is a very small left effusion with left lower lobe collapse/consolidation visualized. There is not enough fluid to warrant therapeutic thoracentesis. Procedure not performed. IMPRESSION: Trace left effusion by ultrasound. Electronically Signed   By: Jerilynn Mages.  Shick M.D.   On: 08/19/2022 10:37    Cardiac Studies   Follow-up 2D Echo: FINDINGS   Pericardium: There is no pericardial effusion anterior to the right  ventricle. There is a large pericardial effusion adjacent to the lateral  wall of left ventricle, improved from 3.6 cm on admission to 2.5 cm now.   Patient Profile     62 y.o. male known to have severe aortic valve stenosis from bicuspid aortic valve s/p SAVR (27 mm Edwards Inspiris Resilia aortic valve) in 07/2022 with LVEF 50 to 55%, HTN, HLD presented to the ER  with worsening SOB since discharge from the hospital in 08/08/2022.  Echo performed on 08/18/2022  showed a large pericardial effusion, s/p pericardiocentesis and drained 900 mL of bloody pericardial fluid.  Sternotomy incision healing. Chest x-ray showed left pleural effusion with ultrasound lungs showed minimal left pleural effusion which is not amenable to thoracentesis.  Limited echocardiogram on 08/19/2022 showed no effusion anterior to the right ventricle but there was a large pericardial effusion lateral to the left ventricle   Assessment & Plan    Post-pericardiotomy syndrome with large pericardial effusion: s/p pericardiocentesis. Residual effusion in the lateral pericardial space. No further signs of tamponade. Plan continue Rx with prednisone and colchicine. If cleared by cardiac surgery, would anticipate DC home next 24 hours with close echo/clinical follow-up.  Severe AS - s/p bioprosthetic AVR. Normal valve function. Continue ASA.  HTN - controlled on current Rx.  Dispo: Transfer to telemetry today, mobilize, anticipate hospital discharge tomorrow if clinically stable.  Will plan a follow-up echocardiogram next week in the office.  For questions or updates, please contact [facility] Please consult [email or web address] for contact info under        Signed, Sherren Mocha, MD  08/21/2022, 7:49 AM

## 2022-08-22 ENCOUNTER — Other Ambulatory Visit (HOSPITAL_COMMUNITY): Payer: Self-pay

## 2022-08-22 ENCOUNTER — Other Ambulatory Visit: Payer: Self-pay | Admitting: Cardiology

## 2022-08-22 DIAGNOSIS — I3139 Other pericardial effusion (noninflammatory): Secondary | ICD-10-CM

## 2022-08-22 LAB — CYTOLOGY - NON PAP

## 2022-08-22 MED ORDER — COLCHICINE 0.6 MG PO TABS
0.6000 mg | ORAL_TABLET | Freq: Two times a day (BID) | ORAL | 0 refills | Status: DC
  Filled 2022-08-22: qty 180, 90d supply, fill #0

## 2022-08-22 MED ORDER — PREDNISONE 10 MG PO TABS
ORAL_TABLET | ORAL | 0 refills | Status: AC
  Filled 2022-08-22: qty 5, 3d supply, fill #0

## 2022-08-22 NOTE — TOC Initial Note (Signed)
Transition of Care [facility]) - Initial/Assessment Note    Patient Details  Name: Craig Alvarez MRN: [id number] Date of Birth: [birth year]  [facility] CM/SW Contact:    Erenest Rasher, RN Phone Number: [phone] 08/22/2022, 12:07 PM  Clinical Narrative:                 Spoke to pt at bedside. States he is working on getting his disability from his job and then [employer] disability. He is independent at home. Sister lives next door and able to assist as needed. Will continue to follow for dc needs.   Expected Discharge Plan: Home/Self Care Barriers to Discharge: No Barriers Identified   Patient Goals and CMS Choice Patient states their goals for this hospitalization and ongoing recovery are:: wants to be able to pay his bills CMS Medicare.gov Compare Post Acute Care list provided to:: Patient        Expected Discharge Plan and Services   Discharge Planning Services: CM Consult   Living arrangements for the past 2 months: Single Family Home Expected Discharge Date: 08/22/22                                    Prior Living Arrangements/Services Living arrangements for the past 2 months: Single Family Home Lives with:: Self Patient language and need for interpreter reviewed:: Yes Do you feel safe going back to the place where you live?: Yes      Need for Family Participation in Patient Care: No (Comment) Care giver support system in place?: Yes (comment)   Criminal Activity/Legal Involvement Pertinent to Current Situation/Hospitalization: No - Comment as needed  Activities of Daily Living Home Assistive Devices/Equipment: Eyeglasses ADL Screening (condition at time of admission) Patient's cognitive ability adequate to safely complete daily activities?: Yes Is the patient deaf or have difficulty hearing?: No Does the patient have difficulty seeing, even when wearing glasses/contacts?: No Does the patient have difficulty concentrating, remembering, or making  decisions?: No Patient able to express need for assistance with ADLs?: Yes Does the patient have difficulty dressing or bathing?: No Independently performs ADLs?: Yes (appropriate for developmental age) Does the patient have difficulty walking or climbing stairs?: No Weakness of Legs: None Weakness of Arms/Hands: None  Permission Sought/Granted Permission sought to share information with : Case Manager, Family Supports, PCP Permission granted to share information with : Yes, Verbal Permission Granted  Share Information with NAME: Barnett Abu     Permission granted to share info w Relationship: sister  Permission granted to share info w Contact Information: [phone]  Emotional Assessment Appearance:: Appears stated age Attitude/Demeanor/Rapport: Engaged Affect (typically observed): Accepting Orientation: : Oriented to Self, Oriented to Place, Oriented to  Time, Oriented to Situation   Psych Involvement: No (comment)  Admission diagnosis:  Pericardial effusion [I31.39] Patient Active Problem List   Diagnosis Date Noted   Pericardial effusion 08/18/2022   S/P AVR (aortic valve replacement) 08/04/2022   Coronary artery disease 07/13/2022   Depression 05/24/2022   Hypercholesteremia 02/14/2021   Acute respiratory disease due to COVID-19 virus 04/26/2020   Bicuspid aortic valve 01/21/2019   Essential hypertension 01/21/2019   Mixed hyperlipidemia 01/21/2019   Insomnia 01/21/2019   Anxiety 01/21/2019   Restless legs syndrome (RLS) 01/21/2019   Tear of meniscus of knee 12/27/2018   Unstable knee 11/20/2018   Chronic bilateral low back pain without sciatica 12/12/2017   Chronic pain  of left knee 12/12/2017   Dyslipidemia 12/21/2015   Nonrheumatic aortic valve stenosis 12/21/2015   ED (erectile dysfunction) 11/04/2015   Prediabetes 11/04/2015   PCP:  Raina Mina., MD Pharmacy:   [id number] [place], [place]  [address]Phone: [phone]: [phone] [facility] [address] [place]Phone: [phone]: [phone]    Social Determinants of Health (SDOH) Social History: SDOH Screenings   Food Insecurity: No Food Insecurity (08/18/2022)  Housing: Low Risk  (08/18/2022)  Transportation Needs: No Transportation Needs (08/18/2022)  Utilities: Not At Risk (08/18/2022)  Tobacco Use: Medium Risk (08/21/2022)   SDOH Interventions:     Readmission Risk Interventions     No data to display

## 2022-08-22 NOTE — Discharge Summary (Signed)
Discharge Summary    Patient ID: Craig Alvarez MRN: [id number]; DOB: 21-Dec-1960  Admit date: 08/18/2022 Discharge date: 08/22/2022  PCP:  Raina Mina., MD   [facility] Cardiologist:  Shirlee More, MD      Discharge Diagnoses    Principal Problem:   Pericardial effusion Active Problems:   Essential hypertension   Mixed hyperlipidemia   Diagnostic Studies/Procedures    Pericardiocentesis 08/18/2022  Successful pericardiocentesis with removal of 900 cc bloody pericardial fluid   Plan: Transfer 2H, monitor drain output, leave to suction _____________   History of Present Illness     Craig Alvarez is a 62 y.o. male with PMH of AS, bicuspid aortic valve s/p surgical aortic valve replacement 07/2022, HTN, HLD who presented to the office on 2/9 with complaints of shortness of breath.  He reported since returning home he had developed increasing shortness of breath.  Took his diuretic which seemed to help a little however his weight was significantly down postoperatively.  There was concern regarding diminished breath sounds will pericardial effusion.  He was sent to the ED for further evaluation.  In the ED he was found to have a large pericardial effusion on echocardiogram.  Cardiology was consulted with recommendations for urgent pericardiocentesis.  Hospital Course     Pericardial effusion/tamponade Post pericardiectomy syndrome --Underwent urgent pericardiocentesis with drainage of 900 cc of fluid.  Did have residual fluid in the lateral pericardial space.  This was discussed with TCTS and it was not felt the patient warranted a pericardial window.  He remained hemodynamically stable without arrhythmia.  He was placed on colchicine 0.6 mg twice daily for total of 3 months and prednisone taper. -- follow up echo limited ordered 2/21  Severe aortic stenosis s/p bioprosthetic valve replacement 07/2022 -- Normal functioning valve postoperatively -- Continue  aspirin  Hypertension -- Well-controlled -- Continue metoprolol and lisinopril  Patient was seen by Dr. Burt Knack and deemed stable for discharge home. Follow up message sent to the [facility] office. Medications sent to the [facility] pharmacy. Educated by pharmD prior to discharge   Did the patient have an acute coronary syndrome (MI, NSTEMI, STEMI, etc) this admission?:  No                               Did the patient have a percutaneous coronary intervention (stent / angioplasty)?:  No.       The patient will be scheduled for a TOC follow up appointment in 10-14 days.  A message has been sent to the [facility] and Scheduling Pool at the office where the patient should be seen for follow up.  _____________  Discharge Vitals Blood pressure 130/86, pulse 82, temperature 97.8 F (36.6 C), temperature source Oral, resp. rate (!) 22, height 6' (1.829 m), weight 96.2 kg, SpO2 94 %.  Filed Weights   08/18/22 1411 08/19/22 0400 08/21/22 0645  Weight: 99.3 kg 103.3 kg 96.2 kg    Labs & Radiologic Studies    CBC Recent Labs    08/20/22 0801  WBC 8.7  HGB 10.7*  HCT 32.1*  MCV 96.4  PLT [id number]*   Basic Metabolic Panel Recent Labs    08/20/22 0801  NA 136  K 3.7  CL 106  CO2 19*  GLUCOSE 123*  BUN 15  CREATININE 0.88  CALCIUM 8.8*   Liver Function Tests Recent Labs    08/20/22 0801  AST 29  ALT 36  ALKPHOS 89  BILITOT 0.6  PROT 6.5  ALBUMIN 3.1*   No results for input(s): "LIPASE", "AMYLASE" in the last 72 hours. High Sensitivity Troponin:   Recent Labs  Lab 08/18/22 1421  TROPONINIHS 18*    BNP Invalid input(s): "POCBNP" D-Dimer No results for input(s): "DDIMER" in the last 72 hours. Hemoglobin A1C No results for input(s): "HGBA1C" in the last 72 hours. Fasting Lipid Panel No results for input(s): "CHOL", "HDL", "LDLCALC", "TRIG", "CHOLHDL", "LDLDIRECT" in the last 72 hours. Thyroid Function Tests No results for input(s): "TSH", "T4TOTAL", "T3FREE", "THYROIDAB" in  the last 72 hours.  Invalid input(s): "FREET3" _____________  ECHOCARDIOGRAM LIMITED  Result Date: 08/19/2022    ECHOCARDIOGRAM LIMITED REPORT   Patient Name:   Craig Alvarez Date of Exam: 08/19/2022 Medical Rec #:  [id number]    Height:       72.0 in Accession #:    [id number]   Weight:       227.7 lb Date of Birth:  [birth year]     BSA:          2.251 m Patient Age:    54 years     BP:           116/85 mmHg Patient Gender: M            HR:           93 bpm. Exam Location:  Inpatient Procedure: Limited Echo Indications:    I31.3 Pericardial effusion  History:        Patient has prior history of Echocardiogram examinations, most                 recent 08/18/2022. Risk Factors:Hypertension and Dyslipidemia.                 Aortic Valve: 27 mm Edwards Inspiris Resilia valve is present in                 the aortic position. Procedure Date: 08/04/22.  Sonographer:    Raquel Sarna Senior RDCS Referring Phys: Quenten Raven MALLIPEDDI  Sonographer Comments: Limited to recheck effusion (pericardial drain in place) IMPRESSIONS  1. Limited Echo to assess pericardial effusion post-pericardiocentesis  2. There is no pericardial effusion anterior to the right ventricle. There is a large pericardial effusion adjacent to the lateral wall of left ventricle, improved from 3.6 cm on admission to 2.5 cm now. FINDINGS  Pericardium: There is no pericardial effusion anterior to the right ventricle. There is a large pericardial effusion adjacent to the lateral wall of left ventricle, improved from 3.6 cm on admission to 2.5 cm now. Aortic Valve: Marland Kitchen Vishnu Priya Mallipeddi Electronically signed by Lorelee Cover Mallipeddi Signature Date/Time: 08/19/2022/2:51:39 PM    Final    [place] CHEST (PLEURAL EFFUSION)  Result Date: 08/19/2022 CLINICAL DATA:  Left effusion, assess for thoracentesis EXAM: CHEST ULTRASOUND COMPARISON:  08/19/2022 FINDINGS: Ultrasound performed of the left posterior chest. There is a very small left effusion with left lower lobe  collapse/consolidation visualized. There is not enough fluid to warrant therapeutic thoracentesis. Procedure not performed. IMPRESSION: Trace left effusion by ultrasound. Electronically Signed   By: Jerilynn Mages.  Shick M.D.   On: 08/19/2022 10:37   DG Chest Port 1 View  Result Date: 08/19/2022 CLINICAL DATA:  Pleural effusion EXAM: PORTABLE CHEST 1 VIEW COMPARISON:  08/18/2022 FINDINGS: No focal consolidation. No pneumothorax. Probable small left pleural effusion and atelectasis. Stable cardiomegaly. Prior aortic valve replacement. Prior median sternotomy. No acute osseous abnormality.  IMPRESSION: 1. Probable small left pleural effusion and atelectasis. Electronically Signed   By: Kathreen Devoid M.D.   On: 08/19/2022 09:21   ECHOCARDIOGRAM LIMITED  Result Date: 08/18/2022    ECHOCARDIOGRAM LIMITED REPORT   Patient Name:   ZHAYDEN LONGOBARDI Date of Exam: 08/18/2022 Medical Rec #:  [id number]    Height:       72.0 in Accession #:    [id number]   Weight:       219.0 lb Date of Birth:  1960/07/18     BSA:          2.214 m Patient Age:    35 years     BP:           131/90 mmHg Patient Gender: M            HR:           97 bpm. Exam Location:  Inpatient Procedure: Limited Echo Indications:    Pericardial effusion  History:        Patient has prior history of Echocardiogram examinations.                 Signs/Symptoms:Shortness of Breath; Risk Factors:Hypertension,                 Dyslipidemia and Former Smoker. Edwards 27 mm Inspiris Resilia                 aortic valve                 replacement. Procedure date 08/04/22.  Sonographer:    Clayton Lefort RDCS (AE) Referring Phys: [place]  1. Limited echo for pericardiocentesis. Initial images show large circumferential pericardial effusion measuring up to 3.5 cm. Final images show significant improvement in size of effusion, localized adjacent to LV lateral wall measuring 2.2 cm FINDINGS  Pericardium: A large pericardial effusion is present. Oswaldo Milian MD  Electronically signed by Oswaldo Milian MD Signature Date/Time: 08/18/2022/6:26:51 PM    Final    CARDIAC CATHETERIZATION  Result Date: 08/18/2022 Successful pericardiocentesis with removal of 900 cc bloody pericardial fluid Plan: Transfer 2H, monitor drain output, leave to suction   ECHOCARDIOGRAM COMPLETE  Result Date: 08/18/2022    ECHOCARDIOGRAM REPORT   Patient Name:   TRIGG BANKOWSKI Date of Exam: 08/18/2022 Medical Rec #:  [id number]    Height:       72.0 in Accession #:    [id number]   Weight:       219.0 lb Date of Birth:  1960/08/31     BSA:          2.214 m Patient Age:    51 years     BP:           126/77 mmHg Patient Gender: M            HR:           94 bpm. Exam Location:  Inpatient Procedure: 2D Echo, Cardiac Doppler and Color Doppler           REPORT CONTAINS CRITICAL RESULT  Results discussed with Dr Cyndia Bent at 16:15 on 08/18/22. Indications:    Pericardial effusion  History:        Patient has prior history of Echocardiogram examinations, most                 recent 08/04/2022. Aortic Valve Disease, Signs/Symptoms:Shortness  of Breath; Risk Factors:Hypertension, Dyslipidemia and Former                 Smoker. Edwards 27 mm Inspiris Resilia aortic valve replacement.                 Procedure date 08/04/22.  Sonographer:    Clayton Lefort RDCS (AE) Referring Phys: [id number] Gaye Pollack  Sonographer Comments: Technically difficult study due to poor echo windows. IMPRESSIONS  1. Large pericardial effusion, measures up to 3.5 cm. No RV collapse seen, but does have fixed/dilated IVC and significant mitral inflow respiratory variation  2. Left ventricular ejection fraction, by estimation, is 50 to 55%. The left ventricle has low normal function. The left ventricle has no regional wall motion abnormalities. There is severe left ventricular hypertrophy. Left ventricular diastolic parameters were normal.  3. Right ventricular systolic function is mildly reduced. The right ventricular size is normal.  Tricuspid regurgitation signal is inadequate for assessing PA pressure.  4. Left atrial size was mildly dilated.  5. The mitral valve is normal in structure. No evidence of mitral valve regurgitation.  6. There is a 27 mm Edwards Inspiris Resilia valve present in the aortic position     Aortic valve regurgitation is not visualized. Echo findings are consistent with normal structure and function of the aortic valve prosthesis. Aortic valve mean gradient measures 3.0 mmHg.  7. The inferior vena cava is dilated in size with <50% respiratory variability, suggesting right atrial pressure of 15 mmHg. FINDINGS  Left Ventricle: Left ventricular ejection fraction, by estimation, is 50 to 55%. The left ventricle has low normal function. The left ventricle has no regional wall motion abnormalities. The left ventricular internal cavity size was normal in size. There is severe left ventricular hypertrophy. Left ventricular diastolic parameters were normal. Right Ventricle: The right ventricular size is normal. No increase in right ventricular wall thickness. Right ventricular systolic function is mildly reduced. Tricuspid regurgitation signal is inadequate for assessing PA pressure. Left Atrium: Left atrial size was mildly dilated. Right Atrium: Right atrial size was normal in size. Pericardium: A large pericardial effusion is present. Mitral Valve: The mitral valve is normal in structure. No evidence of mitral valve regurgitation. Tricuspid Valve: The tricuspid valve is normal in structure. Tricuspid valve regurgitation is trivial. Aortic Valve: The aortic valve has been repaired/replaced. Aortic valve regurgitation is not visualized. Aortic valve mean gradient measures 3.0 mmHg. Aortic valve peak gradient measures 5.6 mmHg. Aortic valve area, by VTI measures 3.98 cm. There is a 27 mm Edwards Inspiris Resilia valve present in the aortic position. Echo findings are consistent with normal structure and function of the aortic  valve prosthesis. Pulmonic Valve: The pulmonic valve was not well visualized. Pulmonic valve regurgitation is not visualized. Aorta: The aortic root is normal in size and structure. Venous: The inferior vena cava is dilated in size with less than 50% respiratory variability, suggesting right atrial pressure of 15 mmHg. IAS/Shunts: The interatrial septum was not well visualized.  LEFT VENTRICLE PLAX 2D LVIDd:         4.60 cm   Diastology LVIDs:         3.40 cm   LV e' medial:    9.25 cm/s LV PW:         1.70 cm   LV E/e' medial:  7.2 LV IVS:        1.70 cm   LV e' lateral:   10.10 cm/s LVOT diam:  2.60 cm   LV E/e' lateral: 6.6 LV SV:         73 LV SV Index:   33 LVOT Area:     5.31 cm  RIGHT VENTRICLE            IVC RV Basal diam:  3.30 cm    IVC diam: 2.30 cm RV S prime:     6.64 cm/s LEFT ATRIUM             Index        RIGHT ATRIUM           Index LA diam:        3.00 cm 1.35 cm/m   RA Area:     15.70 cm LA Vol (A2C):   46.6 ml 21.04 ml/m  RA Volume:   40.40 ml  18.24 ml/m LA Vol (A4C):   83.1 ml 37.53 ml/m LA Biplane Vol: 62.8 ml 28.36 ml/m  AORTIC VALVE AV Area (Vmax):    3.62 cm AV Area (Vmean):   3.42 cm AV Area (VTI):     3.98 cm AV Vmax:           118.00 cm/s AV Vmean:          83.700 cm/s AV VTI:            0.184 m AV Peak Grad:      5.6 mmHg AV Mean Grad:      3.0 mmHg LVOT Vmax:         80.40 cm/s LVOT Vmean:        53.900 cm/s LVOT VTI:          0.138 m LVOT/AV VTI ratio: 0.75  AORTA Ao Root diam: 3.80 cm MITRAL VALVE MV Area (PHT): 5.09 cm    SHUNTS MV Decel Time: 149 msec    Systemic VTI:  0.14 m MV E velocity: 66.90 cm/s  Systemic Diam: 2.60 cm MV A velocity: 82.50 cm/s MV E/A ratio:  0.81 Oswaldo Milian MD Electronically signed by Oswaldo Milian MD Signature Date/Time: 08/18/2022/4:26:07 PM    Final    DG Chest Port 1 View  Result Date: 08/18/2022 CLINICAL DATA:  Shortness of breath EXAM: PORTABLE CHEST 1 VIEW COMPARISON:  08/09/2022 FINDINGS: Cardiomegaly and aortic valve  replacement again noted. New LEFT LOWER lung opacity noted possible LEFT effusion. There is no evidence of pneumothorax. The RIGHT lung is clear. No acute bony abnormalities are present. Remote rib fractures are present. IMPRESSION: 1. New LEFT LOWER lung opacity which may represent atelectasis, consolidation and/or effusion. 2. Cardiomegaly. Electronically Signed   By: Margarette Canada M.D.   On: 08/18/2022 14:33   DG Chest Port 1 View  Result Date: 08/09/2022 CLINICAL DATA:  Shortness of breath EXAM: PORTABLE CHEST 1 VIEW COMPARISON:  08/06/2022 FINDINGS: Cardiomegaly status post median sternotomy with aortic valve prosthesis. Both lungs are clear. The visualized skeletal structures are unremarkable. IMPRESSION: Cardiomegaly without acute abnormality of the lungs. Electronically Signed   By: Delanna Ahmadi M.D.   On: 08/09/2022 13:36   DG Chest Port 1 View  Result Date: 08/06/2022 CLINICAL DATA:  Evaluate for pneumothorax. EXAM: PORTABLE CHEST 1 VIEW COMPARISON:  Chest x-ray August 05, 2022 FINDINGS: The PA catheter has been removed. A right IJ sheath remains. No pneumothorax. Probable atelectasis in the left base, stable. The heart, hila, mediastinum, lungs, and pleura are otherwise unchanged. IMPRESSION: The PA catheter has been removed. A right IJ sheath remains. No pneumothorax. Electronically Signed  By: Dorise Bullion III M.D.   On: 08/06/2022 09:11   DG Chest Port 1 View  Result Date: 08/05/2022 CLINICAL DATA:  Postop from aortic valve replacement. EXAM: PORTABLE CHEST 1 VIEW COMPARISON:  08/04/2022 FINDINGS: Endotracheal tube and nasogastric tube have been removed. Swan-Ganz catheter and mediastinal drain remain in place. No evidence of pneumothorax. Decreased interstitial prominence consistent with a resolving interstitial edema. Subsegmental atelectasis is seen in the left lower lobe, but shows improvement since previous study. IMPRESSION: Resolving interstitial edema and left lower lobe  atelectasis. No pneumothorax visualized. Electronically Signed   By: Marlaine Hind M.D.   On: 08/05/2022 11:05   ECHO INTRAOPERATIVE TEE  Result Date: 08/04/2022  *INTRAOPERATIVE TRANSESOPHAGEAL REPORT *  Patient Name:   DIONTA SYLTE Date of Exam: 08/04/2022 Medical Rec #:  [id number]    Height:       72.0 in Accession #:    [id number]   Weight:       235.0 lb Date of Birth:  Jul 31, 1960     BSA:          2.28 m Patient Age:    41 years     BP:           120/69 mmHg Patient Gender: M            HR:           68 bpm. Exam Location:  Inpatient Transesophogeal exam was perform intraoperatively during surgical procedure. Patient was closely monitored under general anesthesia during the entirety of examination. Indications:     aortic valve replacement Performing Phys: [id number] BRYAN K BARTLE Diagnosing Phys: Belenda Cruise Stoltzfus Complications: No known complications during this procedure. POST-OP IMPRESSIONS _ Left Ventricle: The left ventricle is unchanged from pre-bypass. _ Right Ventricle: The right ventricle appears unchanged from pre-bypass. _ Aorta: The aorta appears unchanged from pre-bypass. _ Aortic Valve: A bioprosthetic valve was placed, leaflets are freely mobile Size; 31m. No regurgitation post replacement. The gradient recorded across the prosthetic valve is within the expected range. No perivalvular leak noted.mean PG 688mg. _ Mitral Valve: There is mild regurgitation. _ Tricuspid Valve: The tricuspid valve appears unchanged from pre-bypass. _ Pulmonic Valve: The pulmonic valve appears unchanged from pre-bypass. _ Interatrial Septum: The interatrial septum appears unchanged from pre-bypass. _ Pericardium: The pericardium appears unchanged from pre-bypass. PRE-OP FINDINGS  Left Ventricle: The left ventricle has normal systolic function, with an ejection fraction of 55-60%. The cavity size was normal. No evidence of left ventricular regional wall motion abnormalities. There is moderate concentric left ventricular  hypertrophy. Left ventricular diastolic parameters were normal. Right Ventricle: The right ventricle has normal systolic function. The cavity was normal. There is no increase in right ventricular wall thickness. Right ventricular systolic pressure is normal. Left Atrium: Left atrial size was normal in size. No left atrial/left atrial appendage thrombus was detected. Left atrial appendage velocity is normal at greater than 40 cm/s. Right Atrium: Right atrial size was normal in size. Prominent Eustachian valve. Interatrial Septum: No atrial level shunt detected by color flow Doppler. The interatrial septum appears to be lipomatous. There is no evidence of a patent foramen ovale. Pericardium: There is no evidence of pericardial effusion. There is no pleural effusion. Mitral Valve: The mitral valve is normal in structure. Mitral valve regurgitation mild to moderate. The MR jet is centrally-directed. There is no evidence of mitral valve vegetation. Pulmonary venous flow is blunted (decreased). There is mild mitral valve regurgitation by PISA measuring 2.26. There  is No evidence of mitral stenosis. A2/P2 poor coaptation. PISA radius .6cm, EROA .12cm2, RV 32cc. Tricuspid Valve: The tricuspid valve was normal in structure. Tricuspid valve regurgitation is mild by color flow Doppler. No evidence of tricuspid stenosis is present. There is no evidence of tricuspid valve vegetation. 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 The jet is posteriorly-directed. 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 There is no evidence of aortic valve vegetation. There is moderate thickening and severe calcifcation present on the aortic valve left coronary and right coronary cusps with severely decreased mobility. Pulmonic Valve: The pulmonic valve was normal in structure, with normal. No evidence of pumonic stenosis. Pulmonic valve regurgitation is  trivial by color flow Doppler. Aorta: The aortic root and ascending aorta are normal in size and structure. Pulmonary Artery: Gordy Councilman catheter present on the right. The pulmonary artery is of normal size. Shunts: There is a small secundum atrial septal defect with predominantly left to right shunting across the atrial septum. +--------------+--------++ LEFT VENTRICLE          +----------------+---------++ +--------------+--------++  Diastology                PLAX 2D                 +----------------+---------++ +--------------+--------++  LV e' lateral:  7.82 cm/s LV PW:        1.40 cm   +----------------+---------++ +--------------+--------++  LV E/e' lateral:10.1      LV IVS:       1.70 cm   +----------------+---------++ +--------------+--------++  LV e' medial:   7.20 cm/s LVOT diam:    2.70 cm   +----------------+---------++ +--------------+--------++  LV E/e' medial: 11.0      LVOT Area:    5.73 cm  +----------------+---------++ +--------------+--------++                         +----------------------+-------++ +--------------+--------++  2D Longitudinal Strain                                    +----------------------+-------++                             2D Strain GLS (A2C):  -13.9 %                             +----------------------+-------++                             2D Strain GLS (A3C):  -11.1 %                             +----------------------+-------++                             2D Strain GLS (A4C):  -9.9 %                              +----------------------+-------++  2D Strain GLS Avg:    -11.6 %                             +----------------------+-------++                              +------------+---------++                             3D Volume EF                                      +------------+---------++                             LV 3D EDV:  126.46 ml                              +------------+---------++                             LV 3D ESV:  54.22 ml                              +------------+---------++ +---------------+------+-------+ RIGHT VENTRICLE              +---------------+------+-------+ TAPSE (M-mode):1.6 cm2.37 cm +---------------+------+-------+ +------------------+------------++ AORTIC VALVE                   +------------------+------------++ AV Area (Vmax):   1.28 cm     +------------------+------------++ AV Area (Vmean):  1.29 cm     +------------------+------------++ AV Area (VTI):    1.48 cm     +------------------+------------++ AV Vmax:          390.00 cm/s  +------------------+------------++ AV Vmean:         267.500 cm/s +------------------+------------++ AV VTI:           1.075 m      +------------------+------------++ AV Peak Grad:     60.8 mmHg    +------------------+------------++ AV Mean Grad:     33.5 mmHg    +------------------+------------++ LVOT Vmax:        86.90 cm/s   +------------------+------------++ LVOT Vmean:       60.500 cm/s  +------------------+------------++ LVOT VTI:         0.278 m      +------------------+------------++ LVOT/AV VTI ratio:0.26         +------------------+------------++ AR PHT:           485 msec     +------------------+------------++  +--------------+-------++ AORTA                 +--------------+-------++ Ao Sinus diam:4.10 cm +--------------+-------++ Ao STJ diam:  2.9 cm  +--------------+-------++ Ao Asc diam:  3.70 cm +--------------+-------++ +--------------+----------++ MITRAL VALVE                 +--------------+-------+ +--------------+----------++     SHUNTS                MV Area (PHT):3.33 cm       +--------------+-------+ +--------------+----------++     Systemic VTI: 0.28 m  MV Peak grad: 3.1  mmHg       +--------------+-------+ +--------------+----------++     Systemic Diam:2.70 cm MV Mean grad: 1.0  mmHg       +--------------+-------+ +--------------+----------++ MV Vmax:      0.87 m/s   +--------------+----------++ MV Vmean:     51.0 cm/s  +--------------+----------++ MV VTI:       0.21 m     +--------------+----------++ MV PHT:       66.12 msec +--------------+----------++ MV Decel Time:228 msec   +--------------+----------++ +----------------+-----------++ MR Peak grad:   206.2 mmHg  +----------------+-----------++ MR Mean grad:   127.0 mmHg  +----------------+-----------++ MR Vmax:        718.00 cm/s +----------------+-----------++ MR Vmean:       523.0 cm/s  +----------------+-----------++ MR PISA:        2.26 cm    +----------------+-----------++ MR PISA Eff ROA:12 mm      +----------------+-----------++ MR PISA Radius: 0.60 cm     +----------------+-----------++ +--------------+----------++ MV E velocity:79.00 cm/s +--------------+----------++ MV A velocity:85.10 cm/s +--------------+----------++ MV E/A ratio: 0.93       +--------------+----------++  Rochele Pages Electronically signed by Rochele Pages Signature Date/Time: 08/04/2022/8:49:58 PM    Final    DG Chest 2 View  Result Date: 08/04/2022 CLINICAL DATA:  [id number] Pre-op chest exam [id number] EXAM: CHEST - 2 VIEW COMPARISON:  April 25, 2020 FINDINGS: The cardiomediastinal silhouette is unchanged in contour. No pleural effusion. No pneumothorax. No acute pleuroparenchymal abnormality. Visualized abdomen is unremarkable. Multilevel degenerative changes of the thoracic spine. Remote LEFT-sided rib fractures. IMPRESSION: No acute cardiopulmonary abnormality. Electronically Signed   By: Valentino Saxon M.D.   On: 08/04/2022 17:32   DG Chest Port 1 View  Result Date: 08/04/2022 CLINICAL DATA:  Status post aortic valve replacement. EXAM: PORTABLE CHEST 1 VIEW COMPARISON:  Chest radiograph 08/02/2022. FINDINGS: Interval postoperative changes of median sternotomy and aortic valve  replacement. Right IJ approach pulmonary artery catheter projects over the right main pulmonary artery. Endotracheal tube tip projects over the midthoracic trachea. Enteric tube side port projects over the stomach. Two mediastinal chest tubes in place. Low lung volumes accentuate the pulmonary vasculature and cardiomediastinal silhouette. Superimposed moderate pulmonary edema with small left pleural effusion. Expected postoperative enlargement of the cardiomediastinal silhouette. Old rib fracture deformities of the left posterior chest wall. IMPRESSION: 1. Expected postoperative changes of median sternotomy aortic valve replacement. Moderate pulmonary edema with small left pleural effusion. 2. Support apparatus in good position, as above. Electronically Signed   By: Emmit Alexanders M.D.   On: 08/04/2022 13:37   EP STUDY  Result Date: 08/04/2022 See surgical note for result.  VAS US DOPPLER PRE CABG  Result Date: 08/02/2022 PREOPERATIVE VASCULAR EVALUATION Patient Name:  SAMUEL PISCHEL  Date of Exam:   08/02/2022 Medical Rec #: [id number]     Accession #:    [id number] Date of Birth: 1960-08-27      Patient Gender: M Patient Age:   88 years Exam Location:  [facility] Procedure:      VAS US DOPPLER PRE CABG Referring Phys: Gilford Raid --------------------------------------------------------------------------------  Indications:  Aorta Valve Replacement. Risk Factors: Hypertension, hyperlipidemia, coronary artery disease. Performing Technologist: Oda Cogan RDMS, RVT  Examination Guidelines: A complete evaluation includes B-mode imaging, spectral Doppler, color Doppler, and power Doppler as needed of all accessible portions of each vessel. Bilateral testing is considered an integral part of a complete examination. Limited examinations for reoccurring indications may be performed as noted.  Right Carotid Findings: +----------+--------+--------+--------+----------------------+--------+  PSV  cm/sEDV cm/sStenosisDescribe              Comments +----------+--------+--------+--------+----------------------+--------+ CCA Prox  88      22                                             +----------+--------+--------+--------+----------------------+--------+ CCA Distal77      27                                             +----------+--------+--------+--------+----------------------+--------+ ICA Prox  118     35      1-39%   irregular and calcific         +----------+--------+--------+--------+----------------------+--------+ ICA Mid   120     40                                             +----------+--------+--------+--------+----------------------+--------+ ICA Distal87      29                                             +----------+--------+--------+--------+----------------------+--------+ ECA       90      18                                             +----------+--------+--------+--------+----------------------+--------+ +----------+--------+-------+----------------+------------+           PSV cm/sEDV cmsDescribe        Arm Pressure +----------+--------+-------+----------------+------------+ Subclavian127            Multiphasic, WNL             +----------+--------+-------+----------------+------------+ +---------+--------+--+--------+--+---------+ VertebralPSV cm/s38EDV cm/s14Antegrade +---------+--------+--+--------+--+---------+ Left Carotid Findings: +----------+--------+--------+--------+--------+--------+           PSV cm/sEDV cm/sStenosisDescribeComments +----------+--------+--------+--------+--------+--------+ CCA Prox  110     26                               +----------+--------+--------+--------+--------+--------+ CCA Distal90      27                               +----------+--------+--------+--------+--------+--------+ ICA Prox  83      35      1-39%                     +----------+--------+--------+--------+--------+--------+ ICA Mid   88      36                               +----------+--------+--------+--------+--------+--------+ ICA Distal70      26                               +----------+--------+--------+--------+--------+--------+ ECA  70      13                               +----------+--------+--------+--------+--------+--------+ +----------+--------+--------+----------------+------------+ SubclavianPSV cm/sEDV cm/sDescribe        Arm Pressure +----------+--------+--------+----------------+------------+           89              Multiphasic, WNL             +----------+--------+--------+----------------+------------+ +---------+--------+--+--------+--+---------+ VertebralPSV cm/s24EDV cm/s11Antegrade +---------+--------+--+--------+--+---------+  ABI Findings: +--------+------------------+-----+---------+--------+ Right   Rt Pressure (mmHg)IndexWaveform Comment  +--------+------------------+-----+---------+--------+ Brachial120                    triphasic         +--------+------------------+-----+---------+--------+ +--------+------------------+-----+---------+-------+ Left    Lt Pressure (mmHg)IndexWaveform Comment +--------+------------------+-----+---------+-------+ [id number]                    triphasic        +--------+------------------+-----+---------+-------+  Right Doppler Findings: +--------+--------+-----+---------+--------+ Site    PressureIndexDoppler  Comments +--------+--------+-----+---------+--------+ [id number]          triphasic         +--------+--------+-----+---------+--------+ Radial               biphasic          +--------+--------+-----+---------+--------+ Ulnar                triphasic         +--------+--------+-----+---------+--------+  Left Doppler Findings: +--------+--------+-----+---------+--------+ Site    PressureIndexDoppler   Comments +--------+--------+-----+---------+--------+ [id number]          triphasic         +--------+--------+-----+---------+--------+ Radial               triphasic         +--------+--------+-----+---------+--------+ Ulnar                triphasic         +--------+--------+-----+---------+--------+   Summary: Right Carotid: Velocities in the right ICA are consistent with a 1-39% stenosis.                Velocities suggest upper end of scale. Left Carotid: Velocities in the left ICA are consistent with a 1-39% stenosis. Vertebrals:  Bilateral vertebral arteries demonstrate antegrade flow. Subclavians: Normal flow hemodynamics were seen in bilateral subclavian              arteries. Right Upper Extremity: Doppler waveforms remain within normal limits with right radial compression. Doppler waveforms decrease <50% with right ulnar compression. Left Upper Extremity: Doppler waveforms remain within normal limits with left radial compression. Doppler waveforms remain within normal limits with left ulnar compression.  Electronically signed by Harold Barban MD on 08/02/2022 at 8:57:41 PM.    Final    Disposition   Pt is being discharged home today in good condition.  Follow-up Plans & Appointments     Follow-up Information     Richardo Priest, MD Follow up.   Specialties: Cardiology, Radiology Why: Office will call you with a follow up appt Contact information: [place] [address] [phone]         [facility] [place] Follow up on 08/30/2022.   Specialty: Cardiology Why: at 2pm for your follow up echo Contact information: [place] [phone]  Discharge Instructions     Call MD for:  difficulty breathing, headache or visual disturbances   Complete by: As directed    Call MD for:  persistant dizziness or light-headedness   Complete by: As directed    Call MD for:  redness,  tenderness, or signs of infection (pain, swelling, redness, odor or green/yellow discharge around incision site)   Complete by: As directed    Diet - low sodium heart healthy   Complete by: As directed    Increase activity slowly   Complete by: As directed         Discharge Medications   Allergies as of 08/22/2022   No Known Allergies      Medication List     TAKE these medications    ALPRAZolam 1 MG tablet Commonly known as: XANAX Take 1 mg by mouth at bedtime as needed for sleep.   amoxicillin 500 MG tablet Commonly known as: AMOXIL Take 2,000 mg by mouth See admin instructions. Take 2000 mg by mouth1 hour before dental procedures   aspirin 81 MG chewable tablet Chew 81 mg by mouth in the morning.   atorvastatin 20 MG tablet Commonly known as: LIPITOR Take 20 mg by mouth every evening.   CALCIUM-VITAMIN D PO Take 1 tablet by mouth daily.   colchicine 0.6 MG tablet Take 1 tablet (0.6 mg total) by mouth 2 (two) times daily.   ezetimibe 10 MG tablet Commonly known as: ZETIA Take 1 tablet (10 mg total) by mouth daily.   ibuprofen 200 MG tablet Commonly known as: ADVIL Take 400 mg by mouth every 8 (eight) hours as needed for moderate pain.   lisinopril 20 MG tablet Commonly known as: ZESTRIL Take 20 mg by mouth in the morning.   loratadine 10 MG tablet Commonly known as: CLARITIN Take 10 mg by mouth daily as needed for allergies.   metoprolol tartrate 50 MG tablet Commonly known as: LOPRESSOR Take 1 tablet (50 mg total) by mouth 2 (two) times daily.   multivitamin tablet Take 1 tablet by mouth daily.   Omega-3 1000 MG Caps Take 1,000 mg by mouth in the morning.   OVER THE COUNTER MEDICATION Take 2 capsules by mouth at bedtime. Medication:Relaxium   predniSONE 10 MG tablet Commonly known as: DELTASONE Take 1 tablet (10 mg total) by mouth 2 (two) times daily for 2 days, THEN 1 tablet (10 mg total) daily for 1 day. Start taking on: August 22, 2022   rOPINIRole 0.5 MG tablet Commonly known as: REQUIP Take 0.5 mg by mouth at bedtime.   sildenafil 20 MG tablet Commonly known as: REVATIO Take 60-80 mg by mouth daily as needed (erectile dysfunction).           Outstanding Labs/Studies   Limited echo  Duration of Discharge Encounter   Greater than 30 minutes including physician time.  Signed, Reino Bellis, NP 08/22/2022, 11:37 AM

## 2022-08-22 NOTE — TOC Progression Note (Signed)
Transition of Care [facility]) - Progression Note    Patient Details  Name: Craig Alvarez MRN: [id number] Date of Birth: 01/01/1961  [facility] CM/SW Contact  Erenest Rasher, RN Phone Number: [phone] 08/22/2022, 12:33 PM  Clinical Narrative:    Spoke to pt and friend will provide transportation. Provided pt with SS Red book to review information on SS disability. Educated pt it is important that he follow up with his Human Resources to discuss his benefits before applying for SS disability or SS retirement.    Expected Discharge Plan: Home/Self Care Barriers to Discharge: No Barriers Identified  Expected Discharge Plan and Services   Discharge Planning Services: CM Consult   Living arrangements for the past 2 months: Single Family Home Expected Discharge Date: 08/22/22                                     Social Determinants of Health (SDOH) Interventions SDOH Screenings   Food Insecurity: No Food Insecurity (08/18/2022)  Housing: Low Risk  (08/18/2022)  Transportation Needs: No Transportation Needs (08/18/2022)  Utilities: Not At Risk (08/18/2022)  Tobacco Use: Medium Risk (08/21/2022)    Readmission Risk Interventions     No data to display

## 2022-08-22 NOTE — Progress Notes (Signed)
Discharge instructions given to patient. Verbalized understanding. Discharged to discharge lounge.

## 2022-08-22 NOTE — Progress Notes (Signed)
Rounding Note    Patient Name: Craig Alvarez Date of Encounter: 08/22/2022  [place] Cardiologist: None   Subjective   Doing well today.  Working on incentive spirometry.  No chest pain or shortness of breath.  Inpatient Medications    Scheduled Meds:  aspirin  81 mg Oral q AM   atorvastatin  20 mg Oral QPM   Chlorhexidine Gluconate Cloth  6 each Topical Daily   colchicine  0.6 mg Oral BID   enoxaparin (LOVENOX) injection  40 mg Subcutaneous Q24H   ezetimibe  10 mg Oral Daily   folic acid  1 mg Oral Daily   lisinopril  20 mg Oral q AM   metoprolol tartrate  50 mg Oral BID   multivitamin with minerals  1 tablet Oral q AM   predniSONE  10 mg Oral 4X daily taper   rOPINIRole  0.5 mg Oral QHS   thiamine  100 mg Oral Daily   Or   thiamine  100 mg Intravenous Daily   Continuous Infusions:  PRN Meds: ALPRAZolam, ibuprofen, loratadine, mouth rinse, pneumococcal 20-valent conjugate vaccine   Vital Signs    Vitals:   08/22/22 0000 08/22/22 0400 08/22/22 0735 08/22/22 0808  BP: 137/86 136/88 131/86   Pulse: 82 74 (!) 102   Resp: 15 (!) 21 18   Temp: 99.7 F (37.6 C) 97.6 F (36.4 C)  97.8 F (36.6 C)  TempSrc: Axillary Oral  Oral  SpO2: 93% 91% 94%   Weight:      Height:        Intake/Output Summary (Last 24 hours) at 08/22/2022 0830 Last data filed at 08/22/2022 0800 Gross per 24 hour  Intake 960 ml  Output 1125 ml  Net -165 ml      08/21/2022    6:45 AM 08/19/2022    4:00 AM 08/18/2022    2:11 PM  Last 3 Weights  Weight (lbs) 212 lb 1.3 oz 227 lb 11.8 oz 219 lb  Weight (kg) 96.2 kg 103.3 kg 99.338 kg      Telemetry    Sinus rhythm/sinus tachycardia- Personally Reviewed    Physical Exam  Alert, oriented, NAD GEN: No acute distress.   Neck: No JVD Cardiac: RRR, no murmurs, rubs, or gallops.  Respiratory: Decreased breath sounds in the bases bilaterally, left worse than right. GI: Soft, nontender, non-distended  MS: No edema; No  deformity. Neuro:  Nonfocal  Psych: Normal affect   Labs    High Sensitivity Troponin:   Recent Labs  Lab 08/18/22 1421  TROPONINIHS 18*     Chemistry Recent Labs  Lab 08/18/22 1421 08/19/22 0549 08/20/22 0801  NA 135 136 136  K 4.3 4.2 3.7  CL 101 103 106  CO2 23 22 19*  GLUCOSE 115* 96 123*  BUN 24* 18 15  CREATININE 0.98 0.99 0.88  CALCIUM 9.3 8.7* 8.8*  PROT 6.6 5.7* 6.5  ALBUMIN 3.2* 2.9* 3.1*  AST 20 21 29  $ ALT 31 29 36  ALKPHOS 82 73 89  BILITOT 1.0 1.2 0.6  GFRNONAA >60 >60 >60  ANIONGAP 11 11 11    $ Lipids No results for input(s): "CHOL", "TRIG", "HDL", "LABVLDL", "LDLCALC", "CHOLHDL" in the last 168 hours.  Hematology Recent Labs  Lab 08/18/22 1421 08/19/22 0549 08/20/22 0801  WBC 7.0 6.7 8.7  RBC 3.05* 2.78* 3.33*  HGB 9.7* 8.9* 10.7*  HCT 29.8* 27.1* 32.1*  MCV 97.7 97.5 96.4  MCH 31.8 32.0 32.1  MCHC 32.6  32.8 33.3  RDW 13.2 13.2 13.2  PLT 532* 444* 606*   Thyroid No results for input(s): "TSH", "FREET4" in the last 168 hours.  BNP Recent Labs  Lab 08/18/22 1421  BNP 234.2*    DDimer No results for input(s): "DDIMER" in the last 168 hours.   Radiology    No results found.   Patient Profile     62 y.o. male  known to have severe aortic valve stenosis from bicuspid aortic valve s/p SAVR (27 mm Edwards Inspiris Resilia aortic valve) in 07/2022 with LVEF 50 to 55%, HTN, HLD presented to the ER with worsening SOB since discharge from the hospital in 08/08/2022.  Echo performed on 08/18/2022 showed a large pericardial effusion, s/p pericardiocentesis and drained 900 mL of bloody pericardial fluid.  Sternotomy incision healing. Chest x-ray showed left pleural effusion with ultrasound lungs showed minimal left pleural effusion which is not amenable to thoracentesis.  Limited echocardiogram on 08/19/2022 showed no effusion anterior to the right ventricle but there was a large pericardial effusion lateral to the left ventricle    Assessment & Plan     1.  Post pericardiotomy syndrome with pericardial effusion/tamponade: Patient status post pericardiocentesis with drainage of 900 cc of fluid.  Residual effusion in the lateral pericardial space noted.  No further signs of tamponade.  He continues on prednisone and colchicine.  Remains hemodynamically stable with no arrhythmia. 2.  Severe aortic stenosis: Status post bioprosthetic aortic valve with normal valve function postoperatively.  He continues on aspirin. 3.  Hypertension: Controlled on current therapy Which includes lisinopril and metoprolol.  Hospital blood pressures reviewed and in range.  Will continue current medications.  Disposition: Medically stable for discharge home today.  Will arrange cardiology follow-up and a surveillance echocardiogram in about 1 week.  For questions or updates, please contact [place] Please consult [email or web address] for contact info under        Signed, Sherren Mocha, MD  08/22/2022, 8:30 AM

## 2022-08-23 LAB — CULTURE, BODY FLUID W GRAM STAIN -BOTTLE: Culture: NO GROWTH

## 2022-08-26 ENCOUNTER — Emergency Department (HOSPITAL_COMMUNITY): Payer: 59

## 2022-08-26 ENCOUNTER — Encounter (HOSPITAL_COMMUNITY): Payer: Self-pay

## 2022-08-26 ENCOUNTER — Emergency Department (EMERGENCY_DEPARTMENT_HOSPITAL): Payer: 59

## 2022-08-26 ENCOUNTER — Observation Stay (HOSPITAL_COMMUNITY)
Admission: EM | Admit: 2022-08-26 | Discharge: 2022-08-28 | DRG: 176 | Disposition: A | Discharge status: Home / Self Care | Payer: 59 | Attending: Internal Medicine | Admitting: Internal Medicine

## 2022-08-26 ENCOUNTER — Other Ambulatory Visit: Payer: Self-pay

## 2022-08-26 DIAGNOSIS — Z79899 Other long term (current) drug therapy: Secondary | ICD-10-CM

## 2022-08-26 DIAGNOSIS — I2699 Other pulmonary embolism without acute cor pulmonale: Secondary | ICD-10-CM

## 2022-08-26 DIAGNOSIS — I2694 Multiple subsegmental pulmonary emboli without acute cor pulmonale: Principal | ICD-10-CM

## 2022-08-26 DIAGNOSIS — G4700 Insomnia, unspecified: Secondary | ICD-10-CM | POA: Diagnosis present

## 2022-08-26 DIAGNOSIS — I3139 Other pericardial effusion (noninflammatory): Secondary | ICD-10-CM | POA: Insufficient documentation

## 2022-08-26 DIAGNOSIS — Z953 Presence of xenogenic heart valve: Secondary | ICD-10-CM

## 2022-08-26 DIAGNOSIS — Z8249 Family history of ischemic heart disease and other diseases of the circulatory system: Secondary | ICD-10-CM

## 2022-08-26 DIAGNOSIS — I2510 Atherosclerotic heart disease of native coronary artery without angina pectoris: Secondary | ICD-10-CM | POA: Insufficient documentation

## 2022-08-26 DIAGNOSIS — Z7982 Long term (current) use of aspirin: Secondary | ICD-10-CM

## 2022-08-26 DIAGNOSIS — G2581 Restless legs syndrome: Secondary | ICD-10-CM | POA: Diagnosis not present

## 2022-08-26 DIAGNOSIS — X58XXXA Exposure to other specified factors, initial encounter: Secondary | ICD-10-CM | POA: Insufficient documentation

## 2022-08-26 DIAGNOSIS — E782 Mixed hyperlipidemia: Secondary | ICD-10-CM | POA: Diagnosis not present

## 2022-08-26 DIAGNOSIS — Z825 Family history of asthma and other chronic lower respiratory diseases: Secondary | ICD-10-CM

## 2022-08-26 DIAGNOSIS — Z952 Presence of prosthetic heart valve: Secondary | ICD-10-CM | POA: Insufficient documentation

## 2022-08-26 DIAGNOSIS — S2231XA Fracture of one rib, right side, initial encounter for closed fracture: Secondary | ICD-10-CM | POA: Diagnosis present

## 2022-08-26 DIAGNOSIS — J9811 Atelectasis: Secondary | ICD-10-CM | POA: Diagnosis present

## 2022-08-26 DIAGNOSIS — Z8679 Personal history of other diseases of the circulatory system: Secondary | ICD-10-CM

## 2022-08-26 DIAGNOSIS — R079 Chest pain, unspecified: Secondary | ICD-10-CM | POA: Diagnosis present

## 2022-08-26 DIAGNOSIS — R7303 Prediabetes: Secondary | ICD-10-CM | POA: Diagnosis present

## 2022-08-26 DIAGNOSIS — Z8616 Personal history of COVID-19: Secondary | ICD-10-CM

## 2022-08-26 DIAGNOSIS — I10 Essential (primary) hypertension: Secondary | ICD-10-CM | POA: Insufficient documentation

## 2022-08-26 DIAGNOSIS — Z87891 Personal history of nicotine dependence: Secondary | ICD-10-CM

## 2022-08-26 DIAGNOSIS — Z833 Family history of diabetes mellitus: Secondary | ICD-10-CM

## 2022-08-26 DIAGNOSIS — F419 Anxiety disorder, unspecified: Secondary | ICD-10-CM | POA: Diagnosis present

## 2022-08-26 HISTORY — DX: Other pulmonary embolism without acute cor pulmonale: I26.99

## 2022-08-26 LAB — HEPATIC FUNCTION PANEL
ALT: 42 U/L (ref 0–44)
AST: 26 U/L (ref 15–41)
Albumin: 3.7 g/dL (ref 3.5–5.0)
Alkaline Phosphatase: 96 U/L (ref 38–126)
Bilirubin, Direct: 0.2 mg/dL (ref 0.0–0.2)
Indirect Bilirubin: 0.6 mg/dL (ref 0.3–0.9)
Total Bilirubin: 0.8 mg/dL (ref 0.3–1.2)
Total Protein: 6.6 g/dL (ref 6.5–8.1)

## 2022-08-26 LAB — BASIC METABOLIC PANEL
Anion gap: 10 (ref 5–15)
BUN: 26 mg/dL — ABNORMAL HIGH (ref 8–23)
CO2: 23 mmol/L (ref 22–32)
Calcium: 9.6 mg/dL (ref 8.9–10.3)
Chloride: 98 mmol/L (ref 98–111)
Creatinine, Ser: 1.01 mg/dL (ref 0.61–1.24)
GFR, Estimated: >60 mL/min (ref >60)
Glucose, Bld: 106 mg/dL — ABNORMAL HIGH (ref 70–99)
Potassium: 4.2 mmol/L (ref 3.5–5.1)
Sodium: 131 mmol/L — ABNORMAL LOW (ref 135–145)

## 2022-08-26 LAB — CBG MONITORING, ED: Glucose-Capillary: 108 mg/dL — ABNORMAL HIGH (ref 70–99)

## 2022-08-26 LAB — CBC WITH DIFFERENTIAL/PLATELET
Abs Immature Granulocytes: 0.05 10*3/uL (ref 0.00–0.07)
Basophils Absolute: 0 10*3/uL (ref 0.0–0.1)
Basophils Relative: 1 %
Eosinophils Absolute: 0.1 10*3/uL (ref 0.0–0.5)
Eosinophils Relative: 1 %
HCT: 38.1 % — ABNORMAL LOW (ref 39.0–52.0)
Hemoglobin: 12.3 g/dL — ABNORMAL LOW (ref 13.0–17.0)
Immature Granulocytes: 1 %
Lymphocytes Relative: 18 %
Lymphs Abs: 1.4 10*3/uL (ref 0.7–4.0)
MCH: 30.3 pg (ref 26.0–34.0)
MCHC: 32.3 g/dL (ref 30.0–36.0)
MCV: 93.8 fL (ref 80.0–100.0)
Monocytes Absolute: 0.9 10*3/uL (ref 0.1–1.0)
Monocytes Relative: 11 %
Neutro Abs: 5.4 10*3/uL (ref 1.7–7.7)
Neutrophils Relative %: 68 %
Platelets: 538 10*3/uL — ABNORMAL HIGH (ref 150–400)
RBC: 4.06 MIL/uL — ABNORMAL LOW (ref 4.22–5.81)
RDW: 13.3 % (ref 11.5–15.5)
WBC: 7.9 10*3/uL (ref 4.0–10.5)
nRBC: 0 % (ref 0.0–0.2)

## 2022-08-26 LAB — MAGNESIUM: Magnesium: 2.2 mg/dL (ref 1.7–2.4)

## 2022-08-26 LAB — TROPONIN I (HIGH SENSITIVITY)
Troponin I (High Sensitivity): 14 ng/L (ref <18)
Troponin I (High Sensitivity): 22 ng/L — ABNORMAL HIGH (ref <18)

## 2022-08-26 LAB — BRAIN NATRIURETIC PEPTIDE: B Natriuretic Peptide: 89.4 pg/mL (ref 0.0–100.0)

## 2022-08-26 LAB — ECHOCARDIOGRAM LIMITED
Height: 72 in
Weight: 3264 oz

## 2022-08-26 LAB — LIPASE, BLOOD: Lipase: 40 U/L (ref 11–51)

## 2022-08-26 LAB — PROTIME-INR
INR: 1.1 (ref 0.8–1.2)
Prothrombin Time: 13.9 seconds (ref 11.4–15.2)

## 2022-08-26 MED ORDER — ALPRAZOLAM 0.5 MG PO TABS
1.0000 mg | ORAL_TABLET | Freq: Every evening | ORAL | Status: DC | PRN
  Administered 2022-08-26 – 2022-08-28 (×2): 1 mg via ORAL
  Filled 2022-08-26 (×2): qty 2

## 2022-08-26 MED ORDER — FENTANYL CITRATE PF 50 MCG/ML IJ SOSY: 50.0000 ug | PREFILLED_SYRINGE | INTRAMUSCULAR | Status: DC | PRN

## 2022-08-26 MED ORDER — EZETIMIBE 10 MG PO TABS
10.0000 mg | ORAL_TABLET | Freq: Every day | ORAL | Status: DC
  Administered 2022-08-27 – 2022-08-28 (×2): 10 mg via ORAL
  Filled 2022-08-26 (×2): qty 1

## 2022-08-26 MED ORDER — ONDANSETRON HCL 4 MG PO TABS
4.0000 mg | ORAL_TABLET | Freq: Four times a day (QID) | ORAL | Status: DC | PRN
  Administered 2022-08-28: 4 mg via ORAL
  Filled 2022-08-26: qty 1

## 2022-08-26 MED ORDER — ASPIRIN 81 MG PO CHEW
81.0000 mg | CHEWABLE_TABLET | Freq: Every morning | ORAL | Status: DC
  Administered 2022-08-27 – 2022-08-28 (×2): 81 mg via ORAL
  Filled 2022-08-26 (×2): qty 1

## 2022-08-26 MED ORDER — IOHEXOL 350 MG/ML SOLN
73.0000 mL | Freq: Once | INTRAVENOUS | Status: AC | PRN
  Administered 2022-08-26: 73 mL via INTRAVENOUS

## 2022-08-26 MED ORDER — COLCHICINE 0.6 MG PO TABS: 0.6000 mg | ORAL_TABLET | Freq: Two times a day (BID) | ORAL | Status: DC

## 2022-08-26 MED ORDER — ACETAMINOPHEN 650 MG RE SUPP: 650.0000 mg | Freq: Four times a day (QID) | RECTAL | Status: DC | PRN

## 2022-08-26 MED ORDER — ROPINIROLE HCL 0.5 MG PO TABS
0.5000 mg | ORAL_TABLET | Freq: Every day | ORAL | Status: DC
  Administered 2022-08-26 – 2022-08-27 (×2): 0.5 mg via ORAL
  Filled 2022-08-26 (×3): qty 1

## 2022-08-26 MED ORDER — LISINOPRIL 20 MG PO TABS
20.0000 mg | ORAL_TABLET | Freq: Every morning | ORAL | Status: DC
  Administered 2022-08-27 – 2022-08-28 (×2): 20 mg via ORAL
  Filled 2022-08-26 (×2): qty 1

## 2022-08-26 MED ORDER — COLCHICINE 0.6 MG PO TABS
0.6000 mg | ORAL_TABLET | Freq: Every day | ORAL | Status: DC
  Administered 2022-08-27 – 2022-08-28 (×2): 0.6 mg via ORAL
  Filled 2022-08-26 (×2): qty 1

## 2022-08-26 MED ORDER — ONDANSETRON HCL 4 MG/2ML IJ SOLN: 4.0000 mg | Freq: Four times a day (QID) | INTRAMUSCULAR | Status: DC | PRN

## 2022-08-26 MED ORDER — METOPROLOL TARTRATE 50 MG PO TABS
50.0000 mg | ORAL_TABLET | Freq: Two times a day (BID) | ORAL | Status: DC
  Administered 2022-08-27 – 2022-08-28 (×3): 50 mg via ORAL
  Filled 2022-08-26 (×3): qty 1

## 2022-08-26 MED ORDER — SODIUM CHLORIDE 0.9% FLUSH: 3.0000 mL | Freq: Two times a day (BID) | INTRAVENOUS | Status: DC

## 2022-08-26 MED ORDER — ASPIRIN 81 MG PO CHEW
243.0000 mg | CHEWABLE_TABLET | Freq: Once | ORAL | Status: AC
  Administered 2022-08-26: 243 mg via ORAL
  Filled 2022-08-26: qty 3

## 2022-08-26 MED ORDER — LACTATED RINGERS IV BOLUS
500.0000 mL | Freq: Once | INTRAVENOUS | Status: AC
  Administered 2022-08-26: 500 mL via INTRAVENOUS

## 2022-08-26 MED ORDER — OXYCODONE HCL 5 MG PO TABS
5.0000 mg | ORAL_TABLET | ORAL | Status: DC | PRN
  Administered 2022-08-26 – 2022-08-28 (×3): 5 mg via ORAL
  Filled 2022-08-26 (×3): qty 1

## 2022-08-26 MED ORDER — ACETAMINOPHEN 325 MG PO TABS
650.0000 mg | ORAL_TABLET | Freq: Four times a day (QID) | ORAL | Status: DC | PRN
  Administered 2022-08-26: 650 mg via ORAL
  Filled 2022-08-26: qty 2

## 2022-08-26 MED ORDER — ATORVASTATIN CALCIUM 10 MG PO TABS
20.0000 mg | ORAL_TABLET | Freq: Every evening | ORAL | Status: DC
  Administered 2022-08-26 – 2022-08-28 (×3): 20 mg via ORAL
  Filled 2022-08-26 (×3): qty 2

## 2022-08-26 MED ORDER — HEPARIN (PORCINE) 25000 UT/250ML-% IV SOLN
1500.0000 [IU]/h | INTRAVENOUS | Status: DC
  Administered 2022-08-26: 1600 [IU]/h via INTRAVENOUS
  Administered 2022-08-27: 1800 [IU]/h via INTRAVENOUS
  Administered 2022-08-28: 1700 [IU]/h via INTRAVENOUS
  Filled 2022-08-26 (×3): qty 250

## 2022-08-26 NOTE — H&P (Signed)
History and Physical    Weaver Secrist [id number] DOB: [birth year] DOA: 08/26/2022  PCP: Raina Mina., MD  Patient coming from: Home  I have personally briefly reviewed patient's old medical records in [place]  Chief Complaint: Shortness of breath  HPI: Craig Alvarez is a 62 y.o. male with medical history significant for severe aortic stenosis s/p bioprosthetic AVR 08/04/2022, pericardial effusion s/p pericardiocentesis w/ 900 cc bloody pericardial fluid removed, HTN, HLD who presented to the ED for evaluation of shortness of breath.  Patient recently underwent aortic valve replacement with bioprosthetic valve on 08/04/2022.  He was readmitted 2/9-2/13 for a large pericardial effusion.  He underwent pericardiocentesis on 2/9 with removal of 900 cc bloody fluid.  Fluid culture was no growth and cytology negative for malignant cells.  Patient was discharged on colchicine 0.6 mg twice daily for 3 months and prednisone taper.  Patient states that he was feeling very well after discharge however over the last 3 days has been having progressively worsening shortness of breath and fatigue which prompted him to return to the ED.  He has had some palpitations but denies any significant chest pain.  He denies any cough, abdominal pain, swelling of his legs.  ED Course  Labs/Imaging on admission: I have personally reviewed following labs and imaging studies.  Labs show WBC 7.9, hemoglobin 12.3, platelets 538,000, sodium 131, potassium 4.2, bicarb 23, BUN 26, creatinine 1.01, serum glucose 106, lipase 40, magnesium 2.2, troponin 14, INR 1.1.  Portable chest x-ray showed small residual left pleural effusion with basilar atelectasis.  Limited echo showed EF 45-50% with abnormal septal motion ?  From LBBB.  Previously seen pericardial effusion is improved, now appears trivial.  Bioprosthetic valve seen in place.  CTA chest shows small volume pulmonary emboli within the right upper and middle  lobe with minimal clot burden.  No right ventricular strain.  No pulmonary infarct.  Groundglass density in right middle lobe seen and favored to be inflammatory.  Fracture of posterior medial right first rib noted and favored to be subacute.  Cardiology were consulted and patient was seen by Dr. Stanford Breed.  Initially felt stable to discharge to home given improved echocardiogram.  However after CTA chest revealed pulmonary emboli, EDP discussed again with on-call cardiology, Dr. Ples Specter, who recommended starting IV heparin and admission for observation overnight.  The hospitalist service was consulted to admit for further evaluation and management.  Review of Systems: All systems reviewed and are negative except as documented in history of present illness above.   Past Medical History:  Diagnosis Date   Acute respiratory disease due to COVID-19 virus 04/26/2020   Anxiety 01/21/2019   Bicuspid aortic valve    Chronic bilateral low back pain without sciatica 12/12/2017   Depression    Dyslipidemia 12/21/2015   ED (erectile dysfunction) 11/04/2015   Essential hypertension 01/21/2019   Hypercholesteremia    Insomnia 01/21/2019   Mixed hyperlipidemia 01/21/2019   Nonrheumatic aortic valve stenosis 12/21/2015   Prediabetes 11/04/2015   Restless legs syndrome (RLS) 01/21/2019   Tear of meniscus of knee 12/27/2018    Past Surgical History:  Procedure Laterality Date   AORTIC VALVE REPLACEMENT N/A 08/04/2022   Procedure: AORTIC VALVE REPLACEMENT (AVR) USING EDWARDS 27 MM INSPIRIS RESILIA AORTIC VALVE;  Surgeon: Gaye Pollack, MD;  Location: [facility];  Service: Open Heart Surgery;  Laterality: N/A;  Median sternotomy   CARDIAC CATHETERIZATION     KNEE ARTHROSCOPY WITH MENISCAL REPAIR Right 2020  PERICARDIOCENTESIS N/A 08/18/2022   Procedure: PERICARDIOCENTESIS;  Surgeon: Sherren Mocha, MD;  Location: [facility];  Service: Cardiovascular;  Laterality: N/A;   RIGHT HEART CATH AND CORONARY  ANGIOGRAPHY N/A 07/13/2022   Procedure: RIGHT HEART CATH AND CORONARY ANGIOGRAPHY;  Surgeon: Early Osmond, MD;  Location: [facility];  Service: Cardiovascular;  Laterality: N/A;   TEE WITHOUT CARDIOVERSION N/A 08/04/2022   Procedure: TRANSESOPHAGEAL ECHOCARDIOGRAM (TEE);  Surgeon: Gaye Pollack, MD;  Location: [facility];  Service: Open Heart Surgery;  Laterality: N/A;    Social History:  reports that he quit smoking about 25 years ago. His smoking use included cigarettes. He has never used smokeless tobacco. He reports current alcohol use of about 33.0 - 35.0 standard drinks of alcohol per week. He reports current drug use. Drug: Marijuana.  No Known Allergies  Family History  Problem Relation Age of Onset   COPD Mother    Heart attack Father    Diabetes Brother    Cancer Maternal Grandmother      Prior to Admission medications   Medication Sig Start Date End Date Taking? Authorizing Provider  ALPRAZolam Duanne Moron) 1 MG tablet Take 1 mg by mouth at bedtime as needed for sleep. 04/06/22   [provider]  amoxicillin (AMOXIL) 500 MG tablet Take 2,000 mg by mouth See admin instructions. Take 2000 mg by mouth1 hour before dental procedures    [provider]  aspirin 81 MG chewable tablet Chew 81 mg by mouth in the morning.    [provider]  atorvastatin (LIPITOR) 20 MG tablet Take 20 mg by mouth every evening. 12/20/18   [provider]  CALCIUM-VITAMIN D PO Take 1 tablet by mouth daily.    [provider]  colchicine 0.6 MG tablet Take 1 tablet (0.6 mg total) by mouth 2 (two) times daily. 08/22/22   Cheryln Manly, NP  ezetimibe (ZETIA) 10 MG tablet Take 1 tablet (10 mg total) by mouth daily. 06/06/22   Richardo Priest, MD  ibuprofen (ADVIL) 200 MG tablet Take 400 mg by mouth every 8 (eight) hours as needed for moderate pain.    [provider]  lisinopril (ZESTRIL) 20 MG tablet Take 20 mg by mouth in the morning. 12/07/21  12/07/22  [provider]  loratadine (CLARITIN) 10 MG tablet Take 10 mg by mouth daily as needed for allergies.    [provider]  metoprolol tartrate (LOPRESSOR) 50 MG tablet Take 1 tablet (50 mg total) by mouth 2 (two) times daily. 08/10/22   Antony Odea, PA-C  Multiple Vitamin (MULTIVITAMIN) tablet Take 1 tablet by mouth daily.    [provider]  Omega-3 1000 MG CAPS Take 1,000 mg by mouth in the morning.    [provider]  OVER THE COUNTER MEDICATION Take 2 capsules by mouth at bedtime. Medication:Relaxium    [provider]  rOPINIRole (REQUIP) 0.5 MG tablet Take 0.5 mg by mouth at bedtime.    [provider]  sildenafil (REVATIO) 20 MG tablet Take 60-80 mg by mouth daily as needed (erectile dysfunction).    [provider]    Physical Exam: Vitals:   08/26/22 1656 08/26/22 1806 08/26/22 1815 08/26/22 1845  BP:  114/76 124/76 117/67  Pulse:  (!) 102 (!) 103 97  Resp:  17 (!) 25 (!) 26  Temp: 98.2 F (36.8 C)     TempSrc: Oral     SpO2:  97% 97% 97%  Weight:      Height:       Constitutional: Resting in bed, NAD, calm, comfortable Eyes: EOMI, lids and conjunctivae normal ENMT: Mucous membranes are moist. Posterior pharynx clear of any exudate or lesions.Normal dentition.  Neck: normal, supple, no masses. Respiratory: clear to auscultation bilaterally, no wheezing, no crackles. Normal respiratory effort. No accessory muscle use.  Cardiovascular: Tachycardic, no murmurs / rubs / gallops. No extremity edema. 2+ pedal pulses. Abdomen: no tenderness, no masses palpated.  Musculoskeletal: no clubbing / cyanosis. No joint deformity upper and lower extremities. Good ROM, no contractures. Normal muscle tone.  Skin: Well-healed sternotomy surgical incision. No induration Neurologic: Sensation intact. Strength 5/5 in all 4.  Psychiatric: Normal judgment and insight. Alert and oriented x 3. Normal mood.   EKG:  Personally reviewed. Sinus tachycardia, rate 104, T wave inversion inferior lateral leads.  T wave changes more pronounced when compared to prior.  Assessment/Plan Principal Problem:   Pulmonary emboli (HCC) Active Problems:   Essential hypertension   Mixed hyperlipidemia   Coronary artery disease   S/P AVR (aortic valve replacement)   Pericardial effusion   Devendra Feuerhelm is a 62 y.o. male with medical history significant for severe aortic stenosis s/p bioprosthetic AVR 08/04/2022, pericardial effusion s/p pericardiocentesis w/ 900 cc bloody pericardial fluid removed, HTN, HLD who is admitted with right-sided pulmonary emboli.  Assessment and Plan: Pulmonary emboli of right upper lobe and right middle lobe: CTA chest shows small volume PE right upper lobe and right middle lobe.  No evidence of pulmonary infarct or right heart strain.  Considered provoked in setting of recent hospitalizations.  EDP discussed with on-call cardiologist, Dr. Ples Specter, who recommended IV heparin and overnight admission given recent bloody pericardial effusion. -Continue IV heparin, potentially transition to [place] on telemetry, supplemental O2 if needed  Recent pericardial effusion s/p pericardiocentesis 08/18/2022: 900 cc of bloody pericardial fluid removed per documentation.  Fluid culture no growth and cytology negative for malignant cells.  Limited echo today (2/17) shows improvement in effusion now being trivial. -Decrease colchicine to 0.6 mg daily per cardiology recommendation -Follow-up with cardiology scheduled 2/26  Severe aortic stenosis s/p bioprosthetic AVR 08/04/2022: Stable by echo.  Follow-up with Dr. Cyndia Bent 2/28.  CAD/HLD: Denies chest pain.  Troponin negative.  Continue aspirin, atorvastatin, Zetia, Lopressor.  Subacute right first rib fracture: Incidental finding on CT imaging.  He is asymptomatic, denies any recent trauma.   -Supplemental O2, incentive spirometer, analgesics as  needed.  Hypertension: Continue Lopressor 50 mg twice daily and lisinopril 20 mg daily.   DVT prophylaxis: Heparin drip Code Status: Full code, confirmed with patient on admission Family Communication: Discussed with patient, he has discussed with family Disposition Plan: From home and likely discharge to home in 1-2 days Consults called: Cardiology Severity of Illness: The appropriate patient status for this patient is OBSERVATION. Observation status is judged to be reasonable and necessary in order to provide the required intensity of service to ensure the patient's safety. The patient's presenting symptoms, physical exam findings, and initial radiographic and laboratory data in the context of their medical condition is felt to place them at decreased risk for further clinical deterioration. Furthermore, it is anticipated that the patient will be medically stable for discharge from the hospital within 2 midnights of admission.   Zada Finders MD [facility]  If 7PM-7AM, please contact night-coverage [email or web address]  08/26/2022, 7:17 PM

## 2022-08-26 NOTE — Hospital Course (Signed)
Craig Alvarez is a 62 y.o. male with medical history significant for severe aortic stenosis s/p bioprosthetic AVR 08/04/2022, pericardial effusion s/p pericardiocentesis w/ 900 cc bloody pericardial fluid removed, HTN, HLD who is admitted with right-sided pulmonary emboli.

## 2022-08-26 NOTE — ED Triage Notes (Signed)
Pt arrived POV from home c/o right sided chest pressure that started on Thursday and is causing him to feel fatigued and SHOB, lightheadedness and nausea.

## 2022-08-26 NOTE — ED Provider Notes (Signed)
[facility] Provider Note   CSN: [id number] Arrival date & time: 08/26/22  1110     History  Chief Complaint  Patient presents with   Chest Pain    Sufiyan Mcgaffigan is a 62 y.o. male.   Chest Pain Associated symptoms: fatigue, nausea and shortness of breath   Patient presents for chest pain.  Medical history includes HTN, HLD, anxiety, RLS, aortic valve stenosis s/p valve replacement, prediabetes, CAD, and pericardial effusion s/p pericardiocentesis.  He underwent aortic replacement 3 weeks ago.  After that, he was found to have a pericardial effusion.   On 2/9, he underwent pericardiocentesis, during which 900 cc of bloody pericardial fluid was removed.  He was discharged 4 days ago.  He states that he felt very well at time of discharge.  2 days ago, he developed similar symptoms to that he was having prior to his recent hospitalization.  He describes a left-sided chest pain, shortness of breath, nausea, and lightheadedness.  He has been taking his home medications as prescribed.  Per chart review, he was prescribed colchicine and ibuprofen for help with pericarditis.  He did take one 81 mg aspirin today.     Home Medications Prior to Admission medications   Medication Sig Start Date End Date Taking? Authorizing Provider  ALPRAZolam Duanne Moron) 1 MG tablet Take 1 mg by mouth at bedtime as needed for sleep. 04/06/22   [provider]  amoxicillin (AMOXIL) 500 MG tablet Take 2,000 mg by mouth See admin instructions. Take 2000 mg by mouth1 hour before dental procedures    [provider]  aspirin 81 MG chewable tablet Chew 81 mg by mouth in the morning.    [provider]  atorvastatin (LIPITOR) 20 MG tablet Take 20 mg by mouth every evening. 12/20/18   [provider]  CALCIUM-VITAMIN D PO Take 1 tablet by mouth daily.    [provider]  colchicine 0.6 MG tablet Take 1 tablet (0.6 mg total) by mouth 2  (two) times daily. 08/22/22   Cheryln Manly, NP  ezetimibe (ZETIA) 10 MG tablet Take 1 tablet (10 mg total) by mouth daily. 06/06/22   Richardo Priest, MD  ibuprofen (ADVIL) 200 MG tablet Take 400 mg by mouth every 8 (eight) hours as needed for moderate pain.    [provider]  lisinopril (ZESTRIL) 20 MG tablet Take 20 mg by mouth in the morning. 12/07/21 12/07/22  [provider]  loratadine (CLARITIN) 10 MG tablet Take 10 mg by mouth daily as needed for allergies.    [provider]  metoprolol tartrate (LOPRESSOR) 50 MG tablet Take 1 tablet (50 mg total) by mouth 2 (two) times daily. 08/10/22   Antony Odea, PA-C  Multiple Vitamin (MULTIVITAMIN) tablet Take 1 tablet by mouth daily.    [provider]  Omega-3 1000 MG CAPS Take 1,000 mg by mouth in the morning.    [provider]  OVER THE COUNTER MEDICATION Take 2 capsules by mouth at bedtime. Medication:Relaxium    [provider]  rOPINIRole (REQUIP) 0.5 MG tablet Take 0.5 mg by mouth at bedtime.    [provider]  sildenafil (REVATIO) 20 MG tablet Take 60-80 mg by mouth daily as needed (erectile dysfunction).    [provider]      Allergies    Patient has no known allergies.    Review of Systems   Review of Systems  Constitutional:  Positive  for fatigue.  Respiratory:  Positive for shortness of breath.   Cardiovascular:  Positive for chest pain.  Gastrointestinal:  Positive for nausea.  Neurological:  Positive for light-headedness.  All other systems reviewed and are negative.   Physical Exam Updated Vital Signs BP 116/75   Pulse (!) 102   Temp 98.2 F (36.8 C) (Oral)   Resp 14   Ht 6' (1.829 m)   Wt 92.5 kg   SpO2 97%   BMI 27.67 kg/m  Physical Exam Vitals and nursing note reviewed.  Constitutional:      General: He is not in acute distress.    Appearance: He is well-developed. He is not ill-appearing, toxic-appearing or diaphoretic.   HENT:     Head: Normocephalic and atraumatic.  Eyes:     Conjunctiva/sclera: Conjunctivae normal.  Cardiovascular:     Rate and Rhythm: Regular rhythm. Tachycardia present.     Heart sounds: No murmur heard. Pulmonary:     Effort: Pulmonary effort is normal. No tachypnea or respiratory distress.     Breath sounds: Normal breath sounds. No decreased breath sounds, wheezing, rhonchi or rales.  Chest:     Chest wall: No tenderness.  Abdominal:     Palpations: Abdomen is soft.     Tenderness: There is no abdominal tenderness.  Musculoskeletal:        General: No swelling. Normal range of motion.     Cervical back: Normal range of motion and neck supple.     Right lower leg: No edema.     Left lower leg: No edema.  Skin:    General: Skin is warm and dry.     Capillary Refill: Capillary refill takes less than 2 seconds.     Coloration: Skin is not cyanotic or pale.  Neurological:     General: No focal deficit present.     Mental Status: He is alert and oriented to person, place, and time.  Psychiatric:        Mood and Affect: Mood normal.        Behavior: Behavior normal.     ED Results / Procedures / Treatments   Labs (all labs ordered are listed, but only abnormal results are displayed) Labs Reviewed  BASIC METABOLIC PANEL - Abnormal; Notable for the following components:      Result Value   Sodium 131 (*)    Glucose, Bld 106 (*)    BUN 26 (*)    All other components within normal limits  CBC WITH DIFFERENTIAL/PLATELET - Abnormal; Notable for the following components:   RBC 4.06 (*)    Hemoglobin 12.3 (*)    HCT 38.1 (*)    Platelets 538 (*)    All other components within normal limits  CBG MONITORING, ED - Abnormal; Notable for the following components:   Glucose-Capillary 108 (*)    All other components within normal limits  MAGNESIUM  LIPASE, BLOOD  HEPATIC FUNCTION PANEL  BRAIN NATRIURETIC PEPTIDE  PROTIME-INR  TROPONIN I (HIGH SENSITIVITY)  TROPONIN I  (HIGH SENSITIVITY)    EKG EKG Interpretation  Date/Time:  Saturday August 26 2022 11:24:03 EST Ventricular Rate:  104 PR Interval:  80 QRS Duration: 105 QT Interval:  330 QTC Calculation: 434 R Axis:   84 Text Interpretation: Sinus tachycardia Probable left atrial enlargement Borderline right axis deviation LVH with secondary repolarization abnormality Confirmed by Godfrey Pick ([id number]) on 08/26/2022 11:26:56 AM  Radiology CT Angio Chest PE W and/or Wo Contrast  Result Date: 08/26/2022  CLINICAL DATA:  Short of breath, RIGHT chest pain EXAM: CT ANGIOGRAPHY CHEST WITH CONTRAST TECHNIQUE: Multidetector CT imaging of the chest was performed using the standard protocol during bolus administration of intravenous contrast. Multiplanar CT image reconstructions and MIPs were obtained to evaluate the vascular anatomy. RADIATION DOSE REDUCTION: This exam was performed according to the departmental dose-optimization program which includes automated exposure control, adjustment of the mA and/or kV according to patient size and/or use of iterative reconstruction technique. CONTRAST:  64m OMNIPAQUE IOHEXOL 350 MG/ML SOLN COMPARISON:  None Available. FINDINGS: Cardiovascular: Small intraluminal filling defect within the posterior RIGHT upper lobe pulmonary artery (image 124/series 6). Filling defect within the proximal RIGHT middle lobe pulmonary artery (image 135/6). Overall clot burden is minimal. No evidence of RIGHT ventricular strain. Minimal pericardial thickening.  No measurable pericardial fluid. Midline sternotomy post CABG. Mediastinum/Nodes: Esophagus and trachea normal. Lungs/Pleura: No pulmonary infarction. Ground-glass geographic region in the RIGHT middle lobe (image 78/7) mild LEFT basilar atelectasis. Upper Abdomen: Limited view of the liver, kidneys, pancreas are unremarkable. Normal adrenal glands. Musculoskeletal: There is a fracture through the medial aspect of the strands of fracture the  posteromedial aspect of the RIGHT first rib (image 79/8 and image 13/5. Mild periosteal reaction suggest subacute fracture Review of the MIP images confirms the above findings. IMPRESSION: 1. Small volume thrombo pulmonary emboli within the RIGHT upper lobe and RIGHT middle lobe. No RIGHT ventricular strain. Overall clot burden is minimal. 2. No pulmonary infarction. Ground-glass density in the RIGHT middle lobe is favored inflammatory. 3. Fracture of the posterior medial RIGHT first rib. Favor subacute fracture. Recommend clinical correlation with chest trauma. Electronically Signed   By: SSuzy BouchardM.D.   On: 08/26/2022 16:56   ECHOCARDIOGRAM LIMITED  Result Date: 08/26/2022    ECHOCARDIOGRAM LIMITED REPORT   Patient Name:   MLAMON HAGADate of Exam: 08/26/2022 Medical Rec #:  [id number]   Height:       72.0 in Accession #:    [id number]  Weight:       204.0 lb Date of Birth:  801-Feb-1962    BSA:          2.149 m Patient Age:    89years     BP:           113/78 mmHg Patient Gender: M            HR:           99 bpm. Exam Location:  Inpatient Procedure: Limited Echo and Limited Color Doppler Indications:    Pericardial effusion I31.3  History:        Patient has prior history of Echocardiogram examinations, most                 recent 08/19/2022. CAD; Risk Factors:Hypertension and                 Dyslipidemia. Bicuspid Aortic Valve.  Sonographer:    SRonny FlurryReferring Phys: [id number] KocherIMPRESSIONS  1. EF appears to be 45-50% with abnormal septal motion ? from LBBB.  2. Limited echo for pericardial effuson the previously seen moderate posterior lateral effusion on TTE 08/19/22 is improved now being trivial.  3. 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 FINDINGS  Additional Comments: Limited echo for pericardial effuson the previously seen moderate posterior lateral effusion on TTE 08/19/22 is improved now being trivial. Patient has a pericardial 27 mm  Inspiris  Resilia bioprosthetic valve in place. EF appears to be 45-50% with abnormal septal motion ? from LBBB.  Jenkins Rouge MD Electronically signed by Jenkins Rouge MD Signature Date/Time: 08/26/2022/3:09:59 PM    Final    DG Chest Portable 1 View  Result Date: 08/26/2022 CLINICAL DATA:  Chest pain EXAM: PORTABLE CHEST 1 VIEW COMPARISON:  08/19/2022 FINDINGS: Stable surgical changes with prosthetic aortic valve. The heart is normal in size. The mediastinal and hilar contours are within normal limits and unchanged. Small residual left pleural effusion with overlying basilar atelectasis. IMPRESSION: Small residual left pleural effusion with overlying basilar atelectasis. Electronically Signed   By: Marijo Sanes M.D.   On: 08/26/2022 12:20    Procedures Procedures    Medications Ordered in ED Medications  ondansetron (ZOFRAN) injection 4 mg (has no administration in time range)  fentaNYL (SUBLIMAZE) injection 50 mcg (has no administration in time range)  aspirin chewable tablet 243 mg (243 mg Oral Given 08/26/22 1217)  lactated ringers bolus 500 mL (500 mLs Intravenous New Bag/Given 08/26/22 1657)  iohexol (OMNIPAQUE) 350 MG/ML injection 73 mL (73 mLs Intravenous Contrast Given 08/26/22 1632)    ED Course/ Medical Decision Making/ A&P                             Medical Decision Making Amount and/or Complexity of Data Reviewed Labs: ordered. Radiology: ordered.  Risk OTC drugs. Prescription drug management.   This patient presents to the ED for concern of chest pain, shortness of breath, and fatigue, this involves an extensive number of treatment options, and is a complaint that carries with it a high risk of complications and morbidity.  The differential diagnosis includes recurrence of pericardial effusion, ACS, PE, pneumonia   Co morbidities that complicate the patient evaluation  HTN, HLD, anxiety, RLS, aortic valve stenosis s/p valve replacement, prediabetes, CAD, and  pericardial effusion s/p pericardiocentesis   Additional history obtained:  Additional history obtained from patient's wife External records from outside source obtained and reviewed including EMR   Lab Tests:  I Ordered, and personally interpreted labs.  The pertinent results include: Increased hemoglobin from baseline, no leukocytosis, hide abnormal troponin with downtrend on repeat, normal lipase, normal BNP, normal kidney function and normal electrolytes   Imaging Studies ordered:  I ordered imaging studies including chest x-ray, echocardiogram, CTA chest I independently visualized and interpreted imaging which showed no acute findings on chest x-ray.  Per cardiologist, no worsening pericardial effusion on echocardiogram.  CTA chest showed right-sided middle and upper lobe PEs. I agree with the radiologist interpretation   Cardiac Monitoring: / EKG:  The patient was maintained on a cardiac monitor.  I personally viewed and interpreted the cardiac monitored which showed an underlying rhythm of: Sinus rhythm   Consultations Obtained:  I requested consultation with the neurologist, Dr. Stanford Breed,  and discussed lab and imaging findings as well as pertinent plan - they recommend: Stat echocardiogram.  Following echocardiogram, patient was cleared from cardiology standpoint.  Following CTA, I did discuss initiation of anticoagulation with subsequent cardiologist on-call, Dr. Ples Specter, who agrees with heparin and admission.   Problem List / ED Course / Critical interventions / Medication management  Patient presents for right-sided chest pain, shortness of breath, nausea, fatigue, and lightheadedness for the past 2 days.  He states that symptoms are very similar to symptoms that he had prior to his recent hospitalization, during which she underwent pericardiocentesis with removal of 900  cc of pericardial fluid.  Vital signs on arrival are notable for tachycardia in the range of 105.  He  states that he has had persistent tachycardia for the past 2 days up to 125 bpm.  He is normotensive at this time.  SpO2 is normal on room air.  On exam, his breathing is unlabored.  I do not appreciate any cardiac rubs or murmurs.  He currently endorses some mild pain and nausea but declines any antiemetics or pain medications at this time.  As needed fentanyl and Zofran were ordered.  Laboratory workup was initiated.  EKG shows sinus rhythm.  Chest x-ray shows small residual left pleural effusion.  On bedside ultrasound, only trace pericardial effusion is identified, however, subxiphoid and apical views are severely limited.  Of note, on his most recent formal echocardiogram he was noted to have residual pericardial fusion to the lateral wall of left ventricle.  Patient was kept on bedside cardiac monitor.  243 mg of ASA was given.  Cardiology was consulted.  I spoke with cardiologist, Dr. Stanford Breed, who recommends stat echocardiogram.  This was ordered.  Patient's lab work is unremarkable.  Initial troponin is normal.  BNP is not elevated.  Echocardiogram was reviewed by cardiologist on-call, Dr. Stanford Breed.  Results are reassuring.  Dr. Stanford Breed evaluated the patient in the ED and does feel comfortable with discharge home.  Currently, he has follow-up appointment scheduled in 1.5 weeks.  Patient's heart rate remained elevated in the range of 105.  CT of chest was ordered to evaluate for possible PE causing his recent symptoms.  CTA did show pulmonary emboli in right middle and upper lobes.  This does correlate with his right-sided chest pain.  Currently, patient remains normotensive with normal SpO2 on room air.  He does remain tachycardic.  Given his recent history, I feel that it would be prudent to initiate heparin and admit for observation.  I discussed this with cardiologist on-call, Dr. Ples Specter, who agrees.  Heparin was ordered.  Patient was admitted for further management. I ordered medication including IV  fluids for hydration; ASA for chest pain; heparin for PE Reevaluation of the patient after these medicines showed that the patient improved I have reviewed the patients home medicines and have made adjustments as needed   Social Determinants of Health:  Has access to outpatient care         Final Clinical Impression(s) / ED Diagnoses Final diagnoses:  Multiple subsegmental pulmonary emboli without acute cor pulmonale ([place])    Rx / DC Orders ED Discharge Orders     None         Godfrey Pick, MD 08/26/22 1755

## 2022-08-26 NOTE — Consult Note (Addendum)
Cardiology Consultation   Patient ID: Nohl Trzcinski MRN: [id number]; DOB: Jan 07, 1961  Admit date: 08/26/2022 Date of Consult: 08/26/2022  PCP:  Raina Mina., MD   [place] [facility] Cardiologist:  Shirlee More, MD        Patient Profile:   Craig Alvarez is a 62 y.o. male with a hx of AS, bicuspid aortic valve s/p surgical aortic valve replacement 07/2022, HTN, HLD who is being seen 08/26/2022 for the evaluation of chest pressure, dyspnea at the request of Dr. Doren Custard.  History of Present Illness:   Craig Alvarez presented to the ED today due to ongoing chest pressure/tightness, dyspnea with accompanying nausea, malaise, chills. Dyspnea reported as worse when supine. These symptoms began this past Thursday and have persisted since. Patient was discharged on Tuesday 2/13 following admission on 2/9 for shortness of breath. Symptoms today remind patient of what he felt last week. During last week's admission, he was found with large pericardial effusion/tamponade and underwent pericardiocentesis with drainage of 966m of bloody fluid. This effusion occurred following surgical aortic valve replacement 08/04/22. Post pericardiocentesis, patient did have residual fluid in the lateral pericardial space, not felt to warrant a pericardial window per discussion with TCTS. Patient discharged on prednisone taper and colchicine x3 months. Reports that he felt "on top of the world" for 2 days following discharge before recurrence of symptoms on Thursday.   Past Medical History:  Diagnosis Date   Acute respiratory disease due to COVID-19 virus 04/26/2020   Anxiety 01/21/2019   Bicuspid aortic valve    Chronic bilateral low back pain without sciatica 12/12/2017   Depression    Dyslipidemia 12/21/2015   ED (erectile dysfunction) 11/04/2015   Essential hypertension 01/21/2019   Hypercholesteremia    Insomnia 01/21/2019   Mixed hyperlipidemia 01/21/2019   Nonrheumatic aortic valve stenosis  12/21/2015   Prediabetes 11/04/2015   Restless legs syndrome (RLS) 01/21/2019   Tear of meniscus of knee 12/27/2018    Past Surgical History:  Procedure Laterality Date   AORTIC VALVE REPLACEMENT N/A 08/04/2022   Procedure: AORTIC VALVE REPLACEMENT (AVR) USING EDWARDS 27 MM INSPIRIS RESILIA AORTIC VALVE;  Surgeon: BGaye Pollack MD;  [facility]  Service: Open Heart Surgery;  Laterality: N/A;  Median sternotomy   CARDIAC CATHETERIZATION     KNEE ARTHROSCOPY WITH MENISCAL REPAIR Right 2020   PERICARDIOCENTESIS N/A 08/18/2022   Procedure: PERICARDIOCENTESIS;  Surgeon: CSherren Mocha MD;  [facility];  Service: Cardiovascular;  Laterality: N/A;   RIGHT HEART CATH AND CORONARY ANGIOGRAPHY N/A 07/13/2022   Procedure: RIGHT HEART CATH AND CORONARY ANGIOGRAPHY;  Surgeon: TEarly Osmond MD;  [facility];  Service: Cardiovascular;  Laterality: N/A;   TEE WITHOUT CARDIOVERSION N/A 08/04/2022   Procedure: TRANSESOPHAGEAL ECHOCARDIOGRAM (TEE);  Surgeon: BGaye Pollack MD;  [facility]  Service: Open Heart Surgery;  Laterality: N/A;     Home Medications:  Prior to Admission medications   Medication Sig Start Date End Date Taking? Authorizing Provider  ALPRAZolam (Duanne Moron 1 MG tablet Take 1 mg by mouth at bedtime as needed for sleep. 04/06/22   [provider]  amoxicillin (AMOXIL) 500 MG tablet Take 2,000 mg by mouth See admin instructions. Take 2000 mg by mouth1 hour before dental procedures    [provider]  aspirin 81 MG chewable tablet Chew 81 mg by mouth in the morning.    [provider]  atorvastatin (LIPITOR) 20 MG tablet Take 20 mg  by mouth every evening. 12/20/18   [provider]  CALCIUM-VITAMIN D PO Take 1 tablet by mouth daily.    [provider]  colchicine 0.6 MG tablet Take 1 tablet (0.6 mg total) by mouth 2 (two) times daily. 08/22/22   Cheryln Manly, NP  ezetimibe (ZETIA) 10 MG tablet  Take 1 tablet (10 mg total) by mouth daily. 06/06/22   Richardo Priest, MD  ibuprofen (ADVIL) 200 MG tablet Take 400 mg by mouth every 8 (eight) hours as needed for moderate pain.    [provider]  lisinopril (ZESTRIL) 20 MG tablet Take 20 mg by mouth in the morning. 12/07/21 12/07/22  [provider]  loratadine (CLARITIN) 10 MG tablet Take 10 mg by mouth daily as needed for allergies.    [provider]  metoprolol tartrate (LOPRESSOR) 50 MG tablet Take 1 tablet (50 mg total) by mouth 2 (two) times daily. 08/10/22   Antony Odea, PA-C  Multiple Vitamin (MULTIVITAMIN) tablet Take 1 tablet by mouth daily.    [provider]  Omega-3 1000 MG CAPS Take 1,000 mg by mouth in the morning.    [provider]  OVER THE COUNTER MEDICATION Take 2 capsules by mouth at bedtime. Medication:Relaxium    [provider]  rOPINIRole (REQUIP) 0.5 MG tablet Take 0.5 mg by mouth at bedtime.    [provider]  sildenafil (REVATIO) 20 MG tablet Take 60-80 mg by mouth daily as needed (erectile dysfunction).    [provider]    Inpatient Medications: Scheduled Meds:  Continuous Infusions:  PRN Meds:   Allergies:   No Known Allergies  Social History:   Social History   Socioeconomic History   Marital status: Single    Spouse name: Not on file   Number of children: Not on file   Years of education: Not on file   Highest education level: Not on file  Occupational History   Not on file  Tobacco Use   Smoking status: Former    Types: Cigarettes    Quit date: 1999    Years since quitting: 25.1   Smokeless tobacco: Never  Vaping Use   Vaping Use: Never used  Substance and Sexual Activity   Alcohol use: Yes    Alcohol/week: 33.0 - 35.0 standard drinks of alcohol    Types: 12 - 14 Glasses of wine, 21 Cans of beer per week   Drug use: Yes    Types: Marijuana    Comment: As of 08/02/22 last marijuana use: 08/01/22   Sexual  activity: Not on file  Other Topics Concern   Not on file  Social History Narrative   Not on file   Social Determinants of Health   Financial Resource Strain: Not on file  Food Insecurity: No Food Insecurity (08/18/2022)   Hunger Vital Sign    Worried About Running Out of Food in the Last Year: Never true    Ran Out of Food in the Last Year: Never true  Transportation Needs: No Transportation Needs (08/18/2022)   PRAPARE - Hydrologist (Medical): No    Lack of Transportation (Non-Medical): No  Physical Activity: Not on file  Stress: Not on file  Social Connections: Not on file  Intimate Partner Violence: Not At Risk (08/18/2022)   Humiliation, Afraid, Rape, and Kick questionnaire    Fear of Current or Ex-Partner: No    Emotionally Abused: No    Physically Abused: No  Sexually Abused: No    Family History:    Family History  Problem Relation Age of Onset   COPD Mother    Heart attack Father    Diabetes Brother    Cancer Maternal Grandmother      ROS:  Please see the history of present illness.   All other ROS reviewed and negative.     Physical Exam/Data:   Vitals:   08/26/22 1118 08/26/22 1121  BP:  113/78  Pulse:  (!) 107  Resp:  18  Temp:  (!) 97.2 F (36.2 C)  TempSrc:  Axillary  SpO2:  94%  Weight: 92.5 kg   Height: 6' (1.829 m)    No intake or output data in the 24 hours ending 08/26/22 1303    08/26/2022   11:18 AM 08/21/2022    6:45 AM 08/19/2022    4:00 AM  Last 3 Weights  Weight (lbs) 204 lb 212 lb 1.3 oz 227 lb 11.8 oz  Weight (kg) 92.534 kg 96.2 kg 103.3 kg     Body mass index is 27.67 kg/m.  General:  Well nourished, well developed, in no acute distress HEENT: normal Neck: no JVD Vascular: No carotid bruits; Distal pulses 2+ bilaterally Cardiac:  normal S1, S2; RRR; no murmur  Lungs:  clear to auscultation bilaterally, no wheezing, rhonchi or rales  Abd: soft, nontender, no hepatomegaly  Ext: no  edema Musculoskeletal:  No deformities, BUE and BLE strength normal and equal Skin: warm and dry  Neuro:  CNs 2-12 intact, no focal abnormalities noted Psych:  Normal affect   EKG:  The EKG was personally reviewed and demonstrates:  sinus tachycardia, LVH, with repol abnormalities Telemetry:  Telemetry was personally reviewed and demonstrates:  sinus rhythm, tachycardic rate  Relevant CV Studies:  08/19/22 Limited TTE  IMPRESSIONS     1. Limited Echo to assess pericardial effusion post-pericardiocentesis   2. There is no pericardial effusion anterior to the right ventricle.  There is a large pericardial effusion adjacent to the lateral wall of left  ventricle, improved from 3.6 cm on admission to 2.5 cm now.   FINDINGS   Pericardium: There is no pericardial effusion anterior to the right  ventricle. There is a large pericardial effusion adjacent to the lateral  wall of left ventricle, improved from 3.6 cm on admission to 2.5 cm now.   08/18/22 TTE complete  IMPRESSIONS     1. Large pericardial effusion, measures up to 3.5 cm. No RV collapse  seen, but does have fixed/dilated IVC and significant mitral inflow  respiratory variation   2. Left ventricular ejection fraction, by estimation, is 50 to 55%. The  left ventricle has low normal function. The left ventricle has no regional  wall motion abnormalities. There is severe left ventricular hypertrophy.  Left ventricular diastolic  parameters were normal.   3. Right ventricular systolic function is mildly reduced. The right  ventricular size is normal. Tricuspid regurgitation signal is inadequate  for assessing PA pressure.   4. Left atrial size was mildly dilated.   5. The mitral valve is normal in structure. No evidence of mitral valve  regurgitation.   6. There is a 27 mm Edwards Inspiris Resilia valve present in the aortic  position     Aortic valve regurgitation is not visualized. Echo findings are  consistent with  normal structure and function of the aortic valve  prosthesis. Aortic valve mean gradient measures 3.0 mmHg.   7. The inferior vena cava is  dilated in size with <50% respiratory  variability, suggesting right atrial pressure of 15 mmHg.   FINDINGS   Left Ventricle: Left ventricular ejection fraction, by estimation, is 50  to 55%. The left ventricle has low normal function. The left ventricle has  no regional wall motion abnormalities. The left ventricular internal  cavity size was normal in size.  There is severe left ventricular hypertrophy. Left ventricular diastolic  parameters were normal.   Right Ventricle: The right ventricular size is normal. No increase in  right ventricular wall thickness. Right ventricular systolic function is  mildly reduced. Tricuspid regurgitation signal is inadequate for assessing  PA pressure.   Left Atrium: Left atrial size was mildly dilated.   Right Atrium: Right atrial size was normal in size.   Pericardium: A large pericardial effusion is present.   Mitral Valve: The mitral valve is normal in structure. No evidence of  mitral valve regurgitation.   Tricuspid Valve: The tricuspid valve is normal in structure. Tricuspid  valve regurgitation is trivial.   Aortic Valve: The aortic valve has been repaired/replaced. Aortic valve  regurgitation is not visualized. Aortic valve mean gradient measures 3.0  mmHg. Aortic valve peak gradient measures 5.6 mmHg. Aortic valve area, by  VTI measures 3.98 cm. There is a  27 mm Edwards Inspiris Resilia valve present in the aortic position. Echo  findings are consistent with normal structure and function of the aortic  valve prosthesis.   Pulmonic Valve: The pulmonic valve was not well visualized. Pulmonic valve  regurgitation is not visualized.   Aorta: The aortic root is normal in size and structure.   Venous: The inferior vena cava is dilated in size with less than 50%  respiratory variability,  suggesting right atrial pressure of 15 mmHg.   IAS/Shunts: The interatrial septum was not well visualized.   Laboratory Data:  High Sensitivity Troponin:   Recent Labs  Lab 08/18/22 1421  TROPONINIHS 18*     Chemistry Recent Labs  Lab 08/20/22 0801  NA 136  K 3.7  CL 106  CO2 19*  GLUCOSE 123*  BUN 15  CREATININE 0.88  CALCIUM 8.8*  GFRNONAA >60  ANIONGAP 11    Recent Labs  Lab 08/20/22 0801  PROT 6.5  ALBUMIN 3.1*  AST 29  ALT 36  ALKPHOS 89  BILITOT 0.6   Lipids No results for input(s): "CHOL", "TRIG", "HDL", "LABVLDL", "LDLCALC", "CHOLHDL" in the last 168 hours.  Hematology Recent Labs  Lab 08/20/22 0801 08/26/22 1206  WBC 8.7 7.9  RBC 3.33* 4.06*  HGB 10.7* 12.3*  HCT 32.1* 38.1*  MCV 96.4 93.8  MCH 32.1 30.3  MCHC 33.3 32.3  RDW 13.2 13.3  PLT 606* 538*   Thyroid No results for input(s): "TSH", "FREET4" in the last 168 hours.  BNPNo results for input(s): "BNP", "PROBNP" in the last 168 hours.  DDimer No results for input(s): "DDIMER" in the last 168 hours.   Radiology/Studies:  DG Chest Portable 1 View  Result Date: 08/26/2022 CLINICAL DATA:  Chest pain EXAM: PORTABLE CHEST 1 VIEW COMPARISON:  08/19/2022 FINDINGS: Stable surgical changes with prosthetic aortic valve. The heart is normal in size. The mediastinal and hilar contours are within normal limits and unchanged. Small residual left pleural effusion with overlying basilar atelectasis. IMPRESSION: Small residual left pleural effusion with overlying basilar atelectasis. Electronically Signed   By: Marijo Sanes M.D.   On: 08/26/2022 12:20     Assessment and Plan:   Chest tightness Dyspnea  Patient presented to the ED today with symptoms of chest tightness and dyspnea, resembling those experienced on 2/9 when patient was found with pericardial effusion. He underwent pericardiocentesis with ~977m removed and was doing well until symptoms recurred on 2/15.  Patient appears euvolemic on  exam. Lungs clear to auscultation. Mild tachycardia.  STAT limited echo requested to assess for recurrent pericardial effusion.  If recurrent effusion, would need to consider pericardial window Continue colchicine 0.67mBID  Hypertension  Patient with BP 113/78 mmHg. Would hold home Lisinopril until TTE can be completed to rule in/out recurrent pericardial effusion. Okay to continue home metoprolol 5062mID at this time.  Bicuspic AV, S/P replacement  Prosthetic valve with normal structure/function as of 08/18/22 TTE. No murmur noted on exam today.   CAD Hyperlipidemia  Patient with 20% mid LM stenosis on 07/13/22 LHC. Continue ASA 17m10mtorvastatin 20mg35mRisk Assessment/Risk Scores:            For questions or updates, please contact [facility] consult [email or web address] for contact info under    Signed, Evan Lily KocherC  08/26/2022 1:03 PM As above, patient seen and examined.  Briefly he is a 61 ye1 old male with past medical history of hypertension, hyperlipidemia, bicuspid aortic valve status post AVR, recent pericardiocentesis for pericardial effusion for evaluation of dyspnea, fatigue and symptoms similar to those prior to recent pericardiocentesis.  Patient has a history of bicuspid aortic valve and underwent aortic valve replacement with a 27 mm Edwards Inspiris Resilia aortic valve on August 04, 2022.  Patient was subsequently readmitted February 9 with large pericardial effusion.  He underwent pericardiocentesis and follow-up echocardiogram showed small effusion in the posterior lateral distribution.  He was treated with prednisone and colchicine and discharged.  For the first 2 days he felt well.  However over the following 2 days he complained of fatigue, chest heaviness that had some increase with lying flat, nausea, chills.  He states the symptoms are similar to those prior to recent pericardiocentesis.  Cardiology now asked to evaluate.  Chest x-ray  shows small left effusion.  Sodium 131, BUN 26, creatinine 1.01, liver functions normal, hemoglobin 12.3, white blood cell count 7.9.  Electrocardiogram shows sinus tachycardia, left ventricular hypertrophy with repolarization abnormality.  1 fatigue/dyspnea-same symptoms as prior to recent pericardiocentesis.  Will plan repeat limited echocardiogram to reassess pericardial effusion.  If recurrent may need to consider window.  For now we will continue colchicine.  2 status post recent aortic valve replacement-continue SBE prophylaxis.  3 hypertension-will hold lisinopril.  Continue metoprolol.  4 hyperlipidemia-continue preadmission medications.  BrianKirk RuthsAddendum-I have personally reviewed the patient's follow-up echocardiogram.  There is trivial pericardial effusion.  Will cancel echocardiogram for Wednesday of the coming week and repeat in 4 weeks to follow-up effusion.  We have arranged [facility].  Patient is also complaining of nausea.  I have asked him to decrease colchicine to 0.6 mg daily to see if his symptoms improve.  Patient can be discharged from a cardiac standpoint and follow-up as scheduled. BrianKirk Ruths

## 2022-08-26 NOTE — ED Notes (Signed)
ED TO INPATIENT HANDOFF REPORT  ED Nurse Name and Phone #: Albina Billet [phone]  S Name/Age/Gender Craig Alvarez 62 y.o. male Room/Bed: 005C/005C  Code Status   Code Status: Full Code  Home/SNF/Other Home Patient oriented to: self, place, time, and situation Is this baseline? Yes   Triage Complete: Triage complete  Chief Complaint Pulmonary emboli [facility]) [I26.99]  Triage Note Pt arrived POV from home c/o right sided chest pressure that started on Thursday and is causing him to feel fatigued and SHOB, lightheadedness and nausea.    Allergies No Known Allergies  Level of Care/Admitting Diagnosis ED Disposition   ED Disposition: Admit Condition: None Comment: Hospital Area: [place] [[id number]]  Level of Care: Telemetry Cardiac [103]  May place patient in observation at [facility] or [place] if equivalent level of care is available:: No  Covid Evaluation: Asymptomatic - no recent exposure (last 10 days) testing not required  Diagnosis: Pulmonary emboli [facility]) [id number]  Admitting Physician: Lenore Cordia [id number]  Attending Physician: Lenore Cordia [id number]      B Medical/Surgery History Past Medical History:  Diagnosis Date   Acute respiratory disease due to COVID-19 virus 04/26/2020   Anxiety 01/21/2019   Bicuspid aortic valve    Chronic bilateral low back pain without sciatica 12/12/2017   Depression    Dyslipidemia 12/21/2015   ED (erectile dysfunction) 11/04/2015   Essential hypertension 01/21/2019   Hypercholesteremia    Insomnia 01/21/2019   Mixed hyperlipidemia 01/21/2019   Nonrheumatic aortic valve stenosis 12/21/2015   Prediabetes 11/04/2015   Restless legs syndrome (RLS) 01/21/2019   Tear of meniscus of knee 12/27/2018   Past Surgical History:  Procedure Laterality Date   AORTIC VALVE REPLACEMENT N/A 08/04/2022   Procedure: AORTIC VALVE REPLACEMENT (AVR) USING EDWARDS 27 MM INSPIRIS RESILIA AORTIC VALVE;  Surgeon: Gaye Pollack, MD;  Location: [place];  Service: Open Heart Surgery;  Laterality: N/A;  Median sternotomy   CARDIAC CATHETERIZATION     KNEE ARTHROSCOPY WITH MENISCAL REPAIR Right 2020   PERICARDIOCENTESIS N/A 08/18/2022   Procedure: PERICARDIOCENTESIS;  Surgeon: Sherren Mocha, MD;  Location: [place] [facility];  Service: Cardiovascular;  Laterality: N/A;   RIGHT HEART CATH AND CORONARY ANGIOGRAPHY N/A 07/13/2022   Procedure: RIGHT HEART CATH AND CORONARY ANGIOGRAPHY;  Surgeon: Early Osmond, MD;  Location: [place] [facility];  Service: Cardiovascular;  Laterality: N/A;   TEE WITHOUT CARDIOVERSION N/A 08/04/2022   Procedure: TRANSESOPHAGEAL ECHOCARDIOGRAM (TEE);  Surgeon: Gaye Pollack, MD;  Location: [place];  Service: Open Heart Surgery;  Laterality: N/A;     A IV Location/Drains/Wounds Patient Lines/Drains/Airways Status     Active Line/Drains/Airways     Name Placement date Placement time Site Days   Peripheral IV 08/26/22 20 G Anterior;Distal;Right;Upper Arm 08/26/22  1211  Arm  less than 1            Intake/Output Last 24 hours No intake or output data in the 24 hours ending 08/26/22 1937  Labs/Imaging Results for orders placed or performed during the hospital encounter of 08/26/22 (from the past 48 hour(s))  Basic metabolic panel     Status: Abnormal   Collection Time: 08/26/22 12:06 PM  Result Value Ref Range   Sodium 131 (L) 135 - 145 mmol/L   Potassium 4.2 3.5 - 5.1 mmol/L   Chloride 98 98 - 111 mmol/L   CO2 23 22 - 32 mmol/L   Glucose, Bld 106 (H) 70 - 99 mg/dL  Comment: Glucose reference range applies only to samples taken after fasting for at least 8 hours.   BUN 26 (H) 8 - 23 mg/dL   Creatinine, Ser 1.01 0.61 - 1.24 mg/dL   Calcium 9.6 8.9 - 10.3 mg/dL   GFR, Estimated >60 >60 mL/min    Comment: (NOTE) Calculated using the CKD-EPI Creatinine Equation (2021)    Anion gap 10 5 - 15    Comment: Performed at [facility] [address] [place], [place]  [address]  Troponin I (High Sensitivity)     Status: None   Collection Time: 08/26/22 12:06 PM  Result Value Ref Range   Troponin I (High Sensitivity) 14 <18 ng/L    Comment: (NOTE) Elevated high sensitivity troponin I (hsTnI) values and significant  changes across serial measurements may suggest ACS but many other  chronic and acute conditions are known to elevate hsTnI results.  Refer to the "Links" section for chest pain algorithms and additional  guidance. Performed at [facility], [place] [address], [place], [place] [address]   Magnesium     Status: None   Collection Time: 08/26/22 12:06 PM  Result Value Ref Range   Magnesium 2.2 1.7 - 2.4 mg/dL    Comment: Performed at [facility], [place] [address] [place], [place] [address]  Lipase, blood     Status: None   Collection Time: 08/26/22 12:06 PM  Result Value Ref Range   Lipase 40 11 - 51 U/L    Comment: Performed at [facility], [place] [address] [place], [place] [address]  Hepatic function panel     Status: None   Collection Time: 08/26/22 12:06 PM  Result Value Ref Range   Total Protein 6.6 6.5 - 8.1 g/dL   Albumin 3.7 3.5 - 5.0 g/dL   AST 26 15 - 41 U/L   ALT 42 0 - 44 U/L   Alkaline Phosphatase 96 38 - 126 U/L   Total Bilirubin 0.8 0.3 - 1.2 mg/dL   Bilirubin, Direct 0.2 0.0 - 0.2 mg/dL   Indirect Bilirubin 0.6 0.3 - 0.9 mg/dL    Comment: Performed at [facility] [address] [place], [place] [address]  Brain natriuretic peptide (order if patient c/o SOB ONLY)     Status: None   Collection Time: 08/26/22 12:06 PM  Result Value Ref Range   B Natriuretic Peptide 89.4 0.0 - 100.0 pg/mL    Comment: Performed at [facility] [address], [place], [place] [address]  CBC with Differential/Platelet     Status: Abnormal   Collection Time: 08/26/22 12:06 PM  Result Value Ref Range   WBC 7.9 4.0 - 10.5 K/uL   RBC 4.06 (L) 4.22 - 5.81 MIL/uL   Hemoglobin 12.3 (L) 13.0 - 17.0 g/dL   HCT 38.1  (L) 39.0 - 52.0 %   MCV 93.8 80.0 - 100.0 fL   MCH 30.3 26.0 - 34.0 pg   MCHC 32.3 30.0 - 36.0 g/dL   RDW 13.3 11.5 - 15.5 %   Platelets 538 (H) 150 - 400 K/uL   nRBC 0.0 0.0 - 0.2 %   Neutrophils Relative % 68 %   Neutro Abs 5.4 1.7 - 7.7 K/uL   Lymphocytes Relative 18 %   Lymphs Abs 1.4 0.7 - 4.0 K/uL   Monocytes Relative 11 %   Monocytes Absolute 0.9 0.1 - 1.0 K/uL   Eosinophils Relative 1 %   Eosinophils  Absolute 0.1 0.0 - 0.5 K/uL   Basophils Relative 1 %   Basophils Absolute 0.0 0.0 - 0.1 K/uL   Immature Granulocytes 1 %   Abs Immature Granulocytes 0.05 0.00 - 0.07 K/uL    Comment: Performed at [facility] [address], [place], [place] [address]  Protime-INR     Status: None   Collection Time: 08/26/22 12:06 PM  Result Value Ref Range   Prothrombin Time 13.9 11.4 - 15.2 seconds   INR 1.1 0.8 - 1.2    Comment: (NOTE) INR goal varies based on device and disease states. Performed at [facility], [place] [address], [place], [place] [address]   CBG monitoring, ED     Status: Abnormal   Collection Time: 08/26/22 12:23 PM  Result Value Ref Range   Glucose-Capillary 108 (H) 70 - 99 mg/dL    Comment: Glucose reference range applies only to samples taken after fasting for at least 8 hours.   Comment 1 Notify RN    Comment 2 Document in Chart    CT Angio Chest PE W and/or Wo Contrast  Result Date: 08/26/2022 CLINICAL DATA:  Short of breath, RIGHT chest pain EXAM: CT ANGIOGRAPHY CHEST WITH CONTRAST TECHNIQUE: Multidetector CT imaging of the chest was performed using the standard protocol during bolus administration of intravenous contrast. Multiplanar CT image reconstructions and MIPs were obtained to evaluate the vascular anatomy. RADIATION DOSE REDUCTION: This exam was performed according to the departmental dose-optimization program which includes automated exposure control, adjustment of the mA and/or kV according to patient size and/or use of iterative  reconstruction technique. CONTRAST:  70m OMNIPAQUE IOHEXOL 350 MG/ML SOLN COMPARISON:  None Available. FINDINGS: Cardiovascular: Small intraluminal filling defect within the posterior RIGHT upper lobe pulmonary artery (image 124/series 6). Filling defect within the proximal RIGHT middle lobe pulmonary artery (image 135/6). Overall clot burden is minimal. No evidence of RIGHT ventricular strain. Minimal pericardial thickening.  No measurable pericardial fluid. Midline sternotomy post CABG. Mediastinum/Nodes: Esophagus and trachea normal. Lungs/Pleura: No pulmonary infarction. Ground-glass geographic region in the RIGHT middle lobe (image 78/7) mild LEFT basilar atelectasis. Upper Abdomen: Limited view of the liver, kidneys, pancreas are unremarkable. Normal adrenal glands. Musculoskeletal: There is a fracture through the medial aspect of the strands of fracture the posteromedial aspect of the RIGHT first rib (image 79/8 and image 13/5. Mild periosteal reaction suggest subacute fracture Review of the MIP images confirms the above findings. IMPRESSION: 1. Small volume thrombo pulmonary emboli within the RIGHT upper lobe and RIGHT middle lobe. No RIGHT ventricular strain. Overall clot burden is minimal. 2. No pulmonary infarction. Ground-glass density in the RIGHT middle lobe is favored inflammatory. 3. Fracture of the posterior medial RIGHT first rib. Favor subacute fracture. Recommend clinical correlation with chest trauma. Electronically Signed   By: SSuzy BouchardM.D.   On: 08/26/2022 16:56   ECHOCARDIOGRAM LIMITED  Result Date: 08/26/2022    ECHOCARDIOGRAM LIMITED REPORT   Patient Name:   Craig MATTINSONDate of Exam: 08/26/2022 Medical Rec #:  [id number]   Height:       72.0 in Accession #:    [id number]  Weight:       204.0 lb Date of Birth:  [birth year]    BSA:          2.149 m Patient Age:    89years     BP:           113/78 mmHg Patient Gender:  M            HR:           99 bpm. Exam Location:  Inpatient  Procedure: Limited Echo and Limited Color Doppler Indications:    Pericardial effusion I31.3  History:        Patient has prior history of Echocardiogram examinations, most                 recent 08/19/2022. CAD; Risk Factors:Hypertension and                 Dyslipidemia. Bicuspid Aortic Valve.  Sonographer:    Ronny Flurry Referring Phys: [id number] Lily Kocher IMPRESSIONS  1. EF appears to be 45-50% with abnormal septal motion ? from LBBB.  2. Limited echo for pericardial effuson the previously seen moderate posterior lateral effusion on TTE 08/19/22 is improved now being trivial.  3. 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 FINDINGS  Additional Comments: Limited echo for pericardial effuson the previously seen moderate posterior lateral effusion on TTE 08/19/22 is improved now being trivial. 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 EF appears to be 45-50% with abnormal septal motion ? from LBBB.  Jenkins Rouge MD Electronically signed by Jenkins Rouge MD Signature Date/Time: 08/26/2022/3:09:59 PM    Final    DG Chest Portable 1 View  Result Date: 08/26/2022 CLINICAL DATA:  Chest pain EXAM: PORTABLE CHEST 1 VIEW COMPARISON:  08/19/2022 FINDINGS: Stable surgical changes with prosthetic aortic valve. The heart is normal in size. The mediastinal and hilar contours are within normal limits and unchanged. Small residual left pleural effusion with overlying basilar atelectasis. IMPRESSION: Small residual left pleural effusion with overlying basilar atelectasis. Electronically Signed   By: Marijo Sanes M.D.   On: 08/26/2022 12:20    Pending Labs Unresulted Labs (From admission, onward)     Start     Ordered   08/28/22 0500  Heparin level (unfractionated)  Daily,   R      08/26/22 1819   08/27/22 0500  HIV Antibody (routine testing w rflx)  (HIV Antibody (Routine testing w reflex) panel)  Tomorrow morning,   R        08/26/22 1910    08/27/22 0500  CBC  Tomorrow morning,   R        08/26/22 1910   08/27/22 [id number]  Basic metabolic panel  Tomorrow morning,   R        08/26/22 1910   08/27/22 0100  Heparin level (unfractionated)  Once-Timed,   URGENT        08/26/22 1819            Vitals/Pain Today's Vitals   08/26/22 1656 08/26/22 1806 08/26/22 1815 08/26/22 1845  BP:  114/76 124/76 117/67  Pulse:  (Abnormal) 102 (Abnormal) 103 97  Resp:  17 (Abnormal) 25 (Abnormal) 26  Temp: 98.2 F (36.8 C)     TempSrc: Oral     SpO2:  97% 97% 97%  Weight:      Height:      PainSc:  0-No pain      Isolation Precautions No active isolations  Medications Medications  fentaNYL (SUBLIMAZE) injection 50 mcg (has no administration in time range)  heparin ADULT infusion 100 units/mL (25000 units/260m) (1,600 Units/hr Intravenous New Bag/Given 08/26/22 1841)  sodium chloride flush (NS) 0.9 % injection 3 mL (has no administration in time range)  acetaminophen (TYLENOL) tablet  650 mg (has no administration in time range)    Or  acetaminophen (TYLENOL) suppository 650 mg (has no administration in time range)  oxyCODONE (Oxy IR/ROXICODONE) immediate release tablet 5 mg (has no administration in time range)  ondansetron (ZOFRAN) tablet 4 mg (has no administration in time range)    Or  ondansetron (ZOFRAN) injection 4 mg (has no administration in time range)  aspirin chewable tablet 81 mg (has no administration in time range)  atorvastatin (LIPITOR) tablet 20 mg (has no administration in time range)  ezetimibe (ZETIA) tablet 10 mg (has no administration in time range)  lisinopril (ZESTRIL) tablet 20 mg (has no administration in time range)  metoprolol tartrate (LOPRESSOR) tablet 50 mg (has no administration in time range)  rOPINIRole (REQUIP) tablet 0.5 mg (has no administration in time range)  ALPRAZolam (XANAX) tablet 1 mg (has no administration in time range)  colchicine tablet 0.6 mg (has no administration in time range)   aspirin chewable tablet 243 mg (243 mg Oral Given 08/26/22 1217)  lactated ringers bolus 500 mL (500 mLs Intravenous New Bag/Given 08/26/22 1657)  iohexol (OMNIPAQUE) 350 MG/ML injection 73 mL (73 mLs Intravenous Contrast Given 08/26/22 1632)    Mobility walks     Focused Assessments Pulmonary Assessment Handoff:  Lung sounds: Bilateral Breath Sounds: Clear O2 Device: Room Air      R Recommendations: See Admitting Provider Note  Report given to:   Additional Notes: A&Ox4. Heparin running 70m/hr. Uses urinal or walks to bathroom.

## 2022-08-26 NOTE — ED Notes (Signed)
Ct came to get patient and patient refused and wanted to go AMA. RN notified EDP. EDP going to speak with patient.

## 2022-08-26 NOTE — Progress Notes (Signed)
ANTICOAGULATION CONSULT NOTE - Initial Consult  Pharmacy Consult for Heparin Indication: pulmonary embolus  No Known Allergies  Patient Measurements: Height: 6' (182.9 cm) Weight: 92.5 kg (204 lb) IBW/kg (Calculated) : 77.6 Heparin Dosing Weight: 92.5kg  Vital Signs: Temp: 98.2 F (36.8 C) (02/17 1656) Temp Source: Oral (02/17 1656) BP: 114/76 (02/17 1806) Pulse Rate: 102 (02/17 1806)  Labs: Recent Labs    08/26/22 1206  HGB 12.3*  HCT 38.1*  PLT 538*  LABPROT 13.9  INR 1.1  CREATININE 1.01  TROPONINIHS 14    Estimated Creatinine Clearance: 84.3 mL/min (by C-G formula based on SCr of 1.01 mg/dL).   Medical History: Past Medical History:  Diagnosis Date   Acute respiratory disease due to COVID-19 virus 04/26/2020   Anxiety 01/21/2019   Bicuspid aortic valve    Chronic bilateral low back pain without sciatica 12/12/2017   Depression    Dyslipidemia 12/21/2015   ED (erectile dysfunction) 11/04/2015   Essential hypertension 01/21/2019   Hypercholesteremia    Insomnia 01/21/2019   Mixed hyperlipidemia 01/21/2019   Nonrheumatic aortic valve stenosis 12/21/2015   Prediabetes 11/04/2015   Restless legs syndrome (RLS) 01/21/2019   Tear of meniscus of knee 12/27/2018    Medications:  (Not in a hospital admission)  Scheduled:  Infusions:   Assessment: 35 yom with a history of AS, bicuspid aortic valve s/p surgical aortic valve replacement 07/2022, HTN, HLD. Patient also with recent history of bloody pericardial effusion s/p pericardiocentesis. Patient is presenting with chest pain. Heparin per pharmacy consult placed for pulmonary embolus.  CTA PE "Small volume thrombo pulmonary emboli within the RIGHT upper lobe and RIGHT middle lobe. No RIGHT ventricular strain. Overall clot burden is minimal."  Patient is not on anticoagulation prior to arrival.  Hgb 12.3; plt 538 PT/INR 13.9/1.1  Goal of Therapy:  Heparin level 0.3-0.7 units/ml Monitor platelets by  anticoagulation protocol: Yes   Plan:  No initial heparin bolus Start heparin infusion at 1600 units/hr Check anti-Xa level in 6 hours and daily while on heparin Continue to monitor H&H and platelets  Lorelei Pont, PharmD, BCPS 08/26/2022 6:16 PM ED Clinical Pharmacist -  [phone]

## 2022-08-26 NOTE — Progress Notes (Signed)
Echocardiogram 2D Echocardiogram has been performed.  Craig Alvarez 08/26/2022, 2:53 PM

## 2022-08-26 NOTE — ED Notes (Signed)
Patient transported to CT 

## 2022-08-26 NOTE — Progress Notes (Unsigned)
  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 male with the above problem list.  He has progressive shortness of breath after DC from his AVR. He was readmitted 2/9-2/13 and noted to have a large pericardial effusion. He underwent pericardiocentesis (neg 900 mL). He had residual fluid in the lateral pericardial space but it was determined he did not need a pericardial window. He was DC on colchicine and prednisone taper. He presented to the ED again on 2/17 with chest pain and shortness of breath. His symptoms reminded him of how he felt when he presented on 2/9. A f/u limited echocardiogram showed just trivial pericardial effusion. His colchicine was reduced to once daily due to nausea. Plan is to f/u in the office today and get a f/u limited echocardiogram again in 2-3 weeks. 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  ***  {EKG done?:28833::"EKG: ***"}  Subjective    Reviewed and updated this encounter:***     ROS  Objective   Labs/Other Test Reviewed:   Recent Labs: 05/26/2022: NT-Pro BNP 469 08/26/2022: ALT 42; B Natriuretic Peptide 89.4; BUN 26; Creatinine, Ser  1.01; Hemoglobin 12.3; Magnesium 2.2; Platelets 538; Potassium 4.2; Sodium 131   Recent Lipid Panel Recent Labs    05/26/22 1444  CHOL 181  TRIG 252*  HDL 39*  LDLCALC 99     Risk Assessment/Calculations/Metrics:   {Does this patient have ATRIAL FIBRILLATION?:[phone]}         Physical Exam:   VS:  There were no vitals taken for this visit.   Wt Readings from Last 3 Encounters:  08/26/22 204 lb (92.5 kg)  08/21/22 212 lb 1.3 oz (96.2 kg)  08/18/22 224 lb (101.6 kg)    Physical Exam *** Assessment & Plan    ASSESSMENT & PLAN:   No problem-specific Assessment & Plan notes found for this encounter.       {      :1}   {Are you ordering a CV Procedure (e.g. stress test, cath, DCCV, TEE, etc)?   Press F2        :[id number]   Dispo:  No follow-ups on file.   Signed, Richardson Dopp, PA-C  08/26/2022 3:39 PM    [place], [place],   [address] Phone: [phone]; Fax: [phone]

## 2022-08-27 DIAGNOSIS — Z8679 Personal history of other diseases of the circulatory system: Secondary | ICD-10-CM | POA: Diagnosis not present

## 2022-08-27 DIAGNOSIS — F419 Anxiety disorder, unspecified: Secondary | ICD-10-CM | POA: Diagnosis present

## 2022-08-27 DIAGNOSIS — Z952 Presence of prosthetic heart valve: Secondary | ICD-10-CM

## 2022-08-27 DIAGNOSIS — Z8616 Personal history of COVID-19: Secondary | ICD-10-CM | POA: Diagnosis not present

## 2022-08-27 DIAGNOSIS — R7303 Prediabetes: Secondary | ICD-10-CM | POA: Diagnosis present

## 2022-08-27 DIAGNOSIS — I3139 Other pericardial effusion (noninflammatory): Secondary | ICD-10-CM | POA: Diagnosis not present

## 2022-08-27 DIAGNOSIS — I2699 Other pulmonary embolism without acute cor pulmonale: Secondary | ICD-10-CM

## 2022-08-27 DIAGNOSIS — I2510 Atherosclerotic heart disease of native coronary artery without angina pectoris: Secondary | ICD-10-CM | POA: Diagnosis present

## 2022-08-27 DIAGNOSIS — I2782 Chronic pulmonary embolism: Secondary | ICD-10-CM | POA: Diagnosis not present

## 2022-08-27 DIAGNOSIS — Z7982 Long term (current) use of aspirin: Secondary | ICD-10-CM | POA: Diagnosis not present

## 2022-08-27 DIAGNOSIS — Z79899 Other long term (current) drug therapy: Secondary | ICD-10-CM | POA: Diagnosis not present

## 2022-08-27 DIAGNOSIS — E782 Mixed hyperlipidemia: Secondary | ICD-10-CM | POA: Diagnosis present

## 2022-08-27 DIAGNOSIS — G4700 Insomnia, unspecified: Secondary | ICD-10-CM | POA: Diagnosis present

## 2022-08-27 DIAGNOSIS — X58XXXA Exposure to other specified factors, initial encounter: Secondary | ICD-10-CM | POA: Diagnosis present

## 2022-08-27 DIAGNOSIS — G2581 Restless legs syndrome: Secondary | ICD-10-CM | POA: Diagnosis present

## 2022-08-27 DIAGNOSIS — J9811 Atelectasis: Secondary | ICD-10-CM | POA: Diagnosis present

## 2022-08-27 DIAGNOSIS — Z8249 Family history of ischemic heart disease and other diseases of the circulatory system: Secondary | ICD-10-CM | POA: Diagnosis not present

## 2022-08-27 DIAGNOSIS — Z87891 Personal history of nicotine dependence: Secondary | ICD-10-CM | POA: Diagnosis not present

## 2022-08-27 DIAGNOSIS — I10 Essential (primary) hypertension: Secondary | ICD-10-CM | POA: Diagnosis not present

## 2022-08-27 DIAGNOSIS — Z833 Family history of diabetes mellitus: Secondary | ICD-10-CM | POA: Diagnosis not present

## 2022-08-27 DIAGNOSIS — Z953 Presence of xenogenic heart valve: Secondary | ICD-10-CM | POA: Diagnosis not present

## 2022-08-27 DIAGNOSIS — S2231XA Fracture of one rib, right side, initial encounter for closed fracture: Secondary | ICD-10-CM | POA: Diagnosis present

## 2022-08-27 DIAGNOSIS — Z825 Family history of asthma and other chronic lower respiratory diseases: Secondary | ICD-10-CM | POA: Diagnosis not present

## 2022-08-27 HISTORY — DX: Other pulmonary embolism without acute cor pulmonale: I26.99

## 2022-08-27 LAB — CBC
HCT: 35.5 % — ABNORMAL LOW (ref 39.0–52.0)
Hemoglobin: 11.6 g/dL — ABNORMAL LOW (ref 13.0–17.0)
MCH: 30.4 pg (ref 26.0–34.0)
MCHC: 32.7 g/dL (ref 30.0–36.0)
MCV: 92.9 fL (ref 80.0–100.0)
Platelets: 464 10*3/uL — ABNORMAL HIGH (ref 150–400)
RBC: 3.82 MIL/uL — ABNORMAL LOW (ref 4.22–5.81)
RDW: 13.3 % (ref 11.5–15.5)
WBC: 7.1 10*3/uL (ref 4.0–10.5)
nRBC: 0 % (ref 0.0–0.2)

## 2022-08-27 LAB — HEPARIN LEVEL (UNFRACTIONATED)
Heparin Unfractionated: 0.24 IU/mL — ABNORMAL LOW (ref 0.30–0.70)
Heparin Unfractionated: 0.66 IU/mL (ref 0.30–0.70)
Heparin Unfractionated: <0.1 IU/mL — ABNORMAL LOW (ref 0.30–0.70)
Heparin Unfractionated: 0.72 IU/mL — ABNORMAL HIGH (ref 0.30–0.70)

## 2022-08-27 LAB — BASIC METABOLIC PANEL
Anion gap: 7 (ref 5–15)
BUN: 19 mg/dL (ref 8–23)
CO2: 26 mmol/L (ref 22–32)
Calcium: 8.9 mg/dL (ref 8.9–10.3)
Chloride: 99 mmol/L (ref 98–111)
Creatinine, Ser: 0.91 mg/dL (ref 0.61–1.24)
GFR, Estimated: >60 mL/min (ref >60)
Glucose, Bld: 131 mg/dL — ABNORMAL HIGH (ref 70–99)
Potassium: 3.6 mmol/L (ref 3.5–5.1)
Sodium: 132 mmol/L — ABNORMAL LOW (ref 135–145)

## 2022-08-27 LAB — HIV ANTIBODY (ROUTINE TESTING W REFLEX): HIV Screen 4th Generation wRfx: NONREACTIVE

## 2022-08-27 MED ORDER — HEPARIN BOLUS VIA INFUSION
3000.0000 [IU] | Freq: Once | INTRAVENOUS | Status: DC
  Filled 2022-08-27: qty 3000

## 2022-08-27 NOTE — Progress Notes (Signed)
ANTICOAGULATION CONSULT NOTE - Follow-Up Consult  Pharmacy Consult for Heparin Indication: pulmonary embolus  No Known Allergies  Patient Measurements: Height: 6' (182.9 cm) Weight: 92.5 kg (204 lb) IBW/kg (Calculated) : 77.6 Heparin Dosing Weight: 92.5kg  Vital Signs: Temp: 98.2 F (36.8 C) (02/18 1530) Temp Source: Oral (02/18 1530) BP: 109/74 (02/18 1149) Pulse Rate: 87 (02/18 1149)  Labs: Recent Labs    08/26/22 1206 08/26/22 2101 08/27/22 0110 08/27/22 0110 08/27/22 0748 08/27/22 1503 08/27/22 1732  HGB 12.3*  --  11.6*  --   --   --   --   HCT 38.1*  --  35.5*  --   --   --   --   PLT 538*  --  464*  --   --   --   --   LABPROT 13.9  --   --   --   --   --   --   INR 1.1  --   --   --   --   --   --   HEPARINUNFRC  --   --  0.24*   < > 0.66 <0.10* 0.72*  CREATININE 1.01  --  0.91  --   --   --   --   TROPONINIHS 14 22*  --   --   --   --   --    < > = values in this interval not displayed.     Estimated Creatinine Clearance: 93.6 mL/min (by C-G formula based on SCr of 0.91 mg/dL).   Medical History: Past Medical History:  Diagnosis Date   Acute respiratory disease due to COVID-19 virus 04/26/2020   Anxiety 01/21/2019   Bicuspid aortic valve    Chronic bilateral low back pain without sciatica 12/12/2017   Depression    Dyslipidemia 12/21/2015   ED (erectile dysfunction) 11/04/2015   Essential hypertension 01/21/2019   Hypercholesteremia    Insomnia 01/21/2019   Mixed hyperlipidemia 01/21/2019   Nonrheumatic aortic valve stenosis 12/21/2015   Prediabetes 11/04/2015   Restless legs syndrome (RLS) 01/21/2019   Tear of meniscus of knee 12/27/2018    Medications:  Medications Prior to Admission  Medication Sig Dispense Refill Last Dose   ALPRAZolam (XANAX) 1 MG tablet Take 1 mg by mouth at bedtime as needed for sleep.   Past Week   aspirin 81 MG chewable tablet Chew 81 mg by mouth in the morning.   08/26/2022   atorvastatin (LIPITOR) 20 MG tablet  Take 20 mg by mouth every evening.   08/25/2022   CALCIUM-VITAMIN D PO Take 1 tablet by mouth daily.   08/26/2022   colchicine 0.6 MG tablet Take 1 tablet (0.6 mg total) by mouth 2 (two) times daily. 180 tablet 0 08/26/2022   ezetimibe (ZETIA) 10 MG tablet Take 1 tablet (10 mg total) by mouth daily. 90 tablet 3 08/26/2022   ibuprofen (ADVIL) 200 MG tablet Take 400 mg by mouth every 8 (eight) hours as needed for moderate pain.   Past Month   lisinopril (ZESTRIL) 20 MG tablet Take 20 mg by mouth in the morning.   08/26/2022   loratadine (CLARITIN) 10 MG tablet Take 10 mg by mouth daily as needed for allergies.   Past Month   metoprolol tartrate (LOPRESSOR) 50 MG tablet Take 1 tablet (50 mg total) by mouth 2 (two) times daily. 60 tablet 5 08/26/2022 at 0800   Multiple Vitamin (MULTIVITAMIN) tablet Take 1 tablet by mouth daily.   08/26/2022  Omega-3 1000 MG CAPS Take 1,000 mg by mouth in the morning.   08/26/2022   OVER THE COUNTER MEDICATION Take 2 capsules by mouth at bedtime. Medication:Relaxium   08/25/2022   rOPINIRole (REQUIP) 0.5 MG tablet Take 0.5 mg by mouth at bedtime.   08/25/2022   sildenafil (REVATIO) 20 MG tablet Take 60-80 mg by mouth daily as needed (erectile dysfunction).   Past Month   amoxicillin (AMOXIL) 500 MG tablet Take 2,000 mg by mouth See admin instructions. Take 2000 mg by mouth1 hour before dental procedures      Scheduled:   aspirin  81 mg Oral q AM   atorvastatin  20 mg Oral QPM   colchicine  0.6 mg Oral Daily   ezetimibe  10 mg Oral Daily   lisinopril  20 mg Oral q AM   metoprolol tartrate  50 mg Oral BID   rOPINIRole  0.5 mg Oral QHS   sodium chloride flush  3 mL Intravenous Q12H   Infusions:   heparin 1,800 Units/hr (08/27/22 0934)    Assessment: 21 yom with a history of AS, bicuspid aortic valve s/p surgical aortic valve replacement 07/2022, HTN, HLD. Patient also with recent history of bloody pericardial effusion s/p pericardiocentesis. Patient is presenting with  chest pain. Patient is not on anticoagulation prior to arrival. Heparin per pharmacy consult placed for pulmonary embolus.  CTA PE "Small volume thrombo pulmonary emboli within the RIGHT upper lobe and RIGHT middle lobe. No RIGHT ventricular strain. Overall clot burden is minimal."  Heparin level today is therapeutic at 0.66, on 1800 units/hr. Hgb 11.6, plt 464--stable. No line issues or signs/symptoms of bleeding reported.  Level came back undetectable this PM but it was therapeutic earlier. Repeat level came back 0.72 so likely the wrong sample was run earlier. We will decrease rate slightly and check level in Am.  Goal of Therapy:  Heparin level 0.3-0.7 units/ml Monitor platelets by anticoagulation protocol: Yes   Plan:  Decrease heparin gtt to 1700 units/hr  Check level in Am Monitor heparin level, CBC, s/sx of bleeding daily    Onnie Boer, PharmD, [place], AAHIVP, CPP Infectious Disease Pharmacist 08/27/2022 6:16 PM

## 2022-08-27 NOTE — Progress Notes (Addendum)
ANTICOAGULATION CONSULT NOTE - Follow-Up Consult  Pharmacy Consult for Heparin Indication: pulmonary embolus  No Known Allergies  Patient Measurements: Height: 6' (182.9 cm) Weight: 92.5 kg (204 lb) IBW/kg (Calculated) : 77.6 Heparin Dosing Weight: 92.5kg  Vital Signs: Temp: 98 F (36.7 C) (02/18 0726) Temp Source: Oral (02/18 0726) BP: 113/73 (02/18 0834) Pulse Rate: 106 (02/18 0835)  Labs: Recent Labs    08/26/22 1206 08/26/22 2101 08/27/22 0110 08/27/22 0748  HGB 12.3*  --  11.6*  --   HCT 38.1*  --  35.5*  --   PLT 538*  --  464*  --   LABPROT 13.9  --   --   --   INR 1.1  --   --   --   HEPARINUNFRC  --   --  0.24* 0.66  CREATININE 1.01  --  0.91  --   TROPONINIHS 14 22*  --   --      Estimated Creatinine Clearance: 93.6 mL/min (by C-G formula based on SCr of 0.91 mg/dL).   Medical History: Past Medical History:  Diagnosis Date   Acute respiratory disease due to COVID-19 virus 04/26/2020   Anxiety 01/21/2019   Bicuspid aortic valve    Chronic bilateral low back pain without sciatica 12/12/2017   Depression    Dyslipidemia 12/21/2015   ED (erectile dysfunction) 11/04/2015   Essential hypertension 01/21/2019   Hypercholesteremia    Insomnia 01/21/2019   Mixed hyperlipidemia 01/21/2019   Nonrheumatic aortic valve stenosis 12/21/2015   Prediabetes 11/04/2015   Restless legs syndrome (RLS) 01/21/2019   Tear of meniscus of knee 12/27/2018    Medications:  Medications Prior to Admission  Medication Sig Dispense Refill Last Dose   ALPRAZolam (XANAX) 1 MG tablet Take 1 mg by mouth at bedtime as needed for sleep.   Past Week   aspirin 81 MG chewable tablet Chew 81 mg by mouth in the morning.   08/26/2022   atorvastatin (LIPITOR) 20 MG tablet Take 20 mg by mouth every evening.   08/25/2022   CALCIUM-VITAMIN D PO Take 1 tablet by mouth daily.   08/26/2022   colchicine 0.6 MG tablet Take 1 tablet (0.6 mg total) by mouth 2 (two) times daily. 180 tablet 0  08/26/2022   ezetimibe (ZETIA) 10 MG tablet Take 1 tablet (10 mg total) by mouth daily. 90 tablet 3 08/26/2022   ibuprofen (ADVIL) 200 MG tablet Take 400 mg by mouth every 8 (eight) hours as needed for moderate pain.   Past Month   lisinopril (ZESTRIL) 20 MG tablet Take 20 mg by mouth in the morning.   08/26/2022   loratadine (CLARITIN) 10 MG tablet Take 10 mg by mouth daily as needed for allergies.   Past Month   metoprolol tartrate (LOPRESSOR) 50 MG tablet Take 1 tablet (50 mg total) by mouth 2 (two) times daily. 60 tablet 5 08/26/2022 at 0800   Multiple Vitamin (MULTIVITAMIN) tablet Take 1 tablet by mouth daily.   08/26/2022   Omega-3 1000 MG CAPS Take 1,000 mg by mouth in the morning.   08/26/2022   OVER THE COUNTER MEDICATION Take 2 capsules by mouth at bedtime. Medication:Relaxium   08/25/2022   rOPINIRole (REQUIP) 0.5 MG tablet Take 0.5 mg by mouth at bedtime.   08/25/2022   sildenafil (REVATIO) 20 MG tablet Take 60-80 mg by mouth daily as needed (erectile dysfunction).   Past Month   amoxicillin (AMOXIL) 500 MG tablet Take 2,000 mg by mouth See admin instructions. Take  2000 mg by mouth1 hour before dental procedures      Scheduled:   aspirin  81 mg Oral q AM   atorvastatin  20 mg Oral QPM   colchicine  0.6 mg Oral Daily   ezetimibe  10 mg Oral Daily   lisinopril  20 mg Oral q AM   metoprolol tartrate  50 mg Oral BID   rOPINIRole  0.5 mg Oral QHS   sodium chloride flush  3 mL Intravenous Q12H   Infusions:   heparin 1,800 Units/hr (08/27/22 0210)    Assessment: 40 yom with a history of AS, bicuspid aortic valve s/p surgical aortic valve replacement 07/2022, HTN, HLD. Patient also with recent history of bloody pericardial effusion s/p pericardiocentesis. Patient is presenting with chest pain. Patient is not on anticoagulation prior to arrival. Heparin per pharmacy consult placed for pulmonary embolus.  CTA PE "Small volume thrombo pulmonary emboli within the RIGHT upper lobe and RIGHT  middle lobe. No RIGHT ventricular strain. Overall clot burden is minimal."  Heparin level today is therapeutic at 0.66, on 1800 units/hr. Hgb 11.6, plt 464--stable. No line issues or signs/symptoms of bleeding reported.   Goal of Therapy:  Heparin level 0.3-0.7 units/ml Monitor platelets by anticoagulation protocol: Yes   Plan:  Continue heparin gtt @1800$  units/hr  Check confirmatory heparin level in 6 hours Monitor heparin level, CBC, s/sx of bleeding daily    Billey Gosling, PharmD PGY1 Pharmacy Resident 2/18/20249:19 AM

## 2022-08-27 NOTE — Progress Notes (Signed)
Rounding Note    Patient Name: Craig Alvarez Date of Encounter: 08/27/2022  [place] Cardiologist: Shirlee More, MD   Subjective   No CP or dyspnea  Inpatient Medications    Scheduled Meds:  aspirin  81 mg Oral q AM   atorvastatin  20 mg Oral QPM   colchicine  0.6 mg Oral Daily   ezetimibe  10 mg Oral Daily   lisinopril  20 mg Oral q AM   metoprolol tartrate  50 mg Oral BID   rOPINIRole  0.5 mg Oral QHS   sodium chloride flush  3 mL Intravenous Q12H   Continuous Infusions:  heparin 1,800 Units/hr (08/27/22 0210)   PRN Meds: acetaminophen **OR** acetaminophen, ALPRAZolam, fentaNYL (SUBLIMAZE) injection, ondansetron **OR** ondansetron (ZOFRAN) IV, oxyCODONE   Vital Signs    Vitals:   08/27/22 0358 08/27/22 0726 08/27/22 0834 08/27/22 0835  BP: 95/68 109/81 113/73   Pulse: 93 94  (!) 106  Resp: 19 19  (!) 22  Temp: 98.1 F (36.7 C) 98 F (36.7 C)    TempSrc: Oral Oral    SpO2: 96% 97%    Weight:      Height:        Intake/Output Summary (Last 24 hours) at 08/27/2022 0906 Last data filed at 08/27/2022 0359 Gross per 24 hour  Intake 1376 ml  Output 600 ml  Net 776 ml      08/26/2022   11:18 AM 08/21/2022    6:45 AM 08/19/2022    4:00 AM  Last 3 Weights  Weight (lbs) 204 lb 212 lb 1.3 oz 227 lb 11.8 oz  Weight (kg) 92.534 kg 96.2 kg 103.3 kg      Telemetry    Sinus - Personally Reviewed   Physical Exam   GEN: No acute distress.   Neck: No JVD Cardiac: RRR, no murmurs, rubs, or gallops.  Respiratory: Diminished breath sounds left lower lobe; sternotomy without evidence of infection. GI: Soft, nontender, non-distended  MS: No edema Neuro:  Nonfocal  Psych: Normal affect   Labs    High Sensitivity Troponin:   Recent Labs  Lab 08/18/22 1421 08/26/22 1206 08/26/22 2101  TROPONINIHS 18* 14 22*     Chemistry Recent Labs  Lab 08/26/22 1206 08/27/22 0110  NA 131* 132*  K 4.2 3.6  CL 98 99  CO2 23 26  GLUCOSE 106* 131*  BUN  26* 19  CREATININE 1.01 0.91  CALCIUM 9.6 8.9  MG 2.2  --   PROT 6.6  --   ALBUMIN 3.7  --   AST 26  --   ALT 42  --   ALKPHOS 96  --   BILITOT 0.8  --   GFRNONAA >60 >60  ANIONGAP 10 7     Hematology Recent Labs  Lab 08/26/22 1206 08/27/22 0110  WBC 7.9 7.1  RBC 4.06* 3.82*  HGB 12.3* 11.6*  HCT 38.1* 35.5*  MCV 93.8 92.9  MCH 30.3 30.4  MCHC 32.3 32.7  RDW 13.3 13.3  PLT 538* 464*    BNP Recent Labs  Lab 08/26/22 1206  BNP 89.4      Radiology    CT Angio Chest PE W and/or Wo Contrast  Result Date: 08/26/2022 CLINICAL DATA:  Short of breath, RIGHT chest pain EXAM: CT ANGIOGRAPHY CHEST WITH CONTRAST TECHNIQUE: Multidetector CT imaging of the chest was performed using the standard protocol during bolus administration of intravenous contrast. Multiplanar CT image reconstructions and MIPs were obtained to  evaluate the vascular anatomy. RADIATION DOSE REDUCTION: This exam was performed according to the departmental dose-optimization program which includes automated exposure control, adjustment of the mA and/or kV according to patient size and/or use of iterative reconstruction technique. CONTRAST:  49m OMNIPAQUE IOHEXOL 350 MG/ML SOLN COMPARISON:  None Available. FINDINGS: Cardiovascular: Small intraluminal filling defect within the posterior RIGHT upper lobe pulmonary artery (image 124/series 6). Filling defect within the proximal RIGHT middle lobe pulmonary artery (image 135/6). Overall clot burden is minimal. No evidence of RIGHT ventricular strain. Minimal pericardial thickening.  No measurable pericardial fluid. Midline sternotomy post CABG. Mediastinum/Nodes: Esophagus and trachea normal. Lungs/Pleura: No pulmonary infarction. Ground-glass geographic region in the RIGHT middle lobe (image 78/7) mild LEFT basilar atelectasis. Upper Abdomen: Limited view of the liver, kidneys, pancreas are unremarkable. Normal adrenal glands. Musculoskeletal: There is a fracture through the  medial aspect of the strands of fracture the posteromedial aspect of the RIGHT first rib (image 79/8 and image 13/5. Mild periosteal reaction suggest subacute fracture Review of the MIP images confirms the above findings. IMPRESSION: 1. Small volume thrombo pulmonary emboli within the RIGHT upper lobe and RIGHT middle lobe. No RIGHT ventricular strain. Overall clot burden is minimal. 2. No pulmonary infarction. Ground-glass density in the RIGHT middle lobe is favored inflammatory. 3. Fracture of the posterior medial RIGHT first rib. Favor subacute fracture. Recommend clinical correlation with chest trauma. Electronically Signed   By: SSuzy BouchardM.D.   On: 08/26/2022 16:56   ECHOCARDIOGRAM LIMITED  Result Date: 08/26/2022    ECHOCARDIOGRAM LIMITED REPORT   Patient Name:   Craig ZELENAKDate of Exam: 08/26/2022 Medical Rec #:  [id number]   Height:       72.0 in Accession #:    [id number]  Weight:       204.0 lb Date of Birth:  [birth year]    BSA:          2.149 m Patient Age:    89years     BP:           113/78 mmHg Patient Gender: M            HR:           99 bpm. Exam Location:  Inpatient Procedure: Limited Echo and Limited Color Doppler Indications:    Pericardial effusion I31.3  History:        Patient has prior history of Echocardiogram examinations, most                 recent 08/19/2022. CAD; Risk Factors:Hypertension and                 Dyslipidemia. Bicuspid Aortic Valve.  Sonographer:    SRonny FlurryReferring Phys: [id number] KocherIMPRESSIONS  1. EF appears to be 45-50% with abnormal septal motion ? from LBBB.  2. Limited echo for pericardial effuson the previously seen moderate posterior lateral effusion on TTE 08/19/22 is improved now being trivial.  3. 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 FINDINGS  Additional Comments: Limited echo for pericardial effuson the previously seen moderate posterior lateral effusion on TTE 08/19/22 is improved now  being trivial. 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 EF appears to be 45-50% with abnormal septal motion ? from LBBB.  PJenkins RougeMD Electronically signed by PJenkins RougeMD Signature Date/Time: 08/26/2022/3:09:59 PM    Final    DG Chest Portable 1 View  Result Date: 08/26/2022 CLINICAL DATA:  Chest pain EXAM: PORTABLE CHEST 1 VIEW COMPARISON:  08/19/2022 FINDINGS: Stable surgical changes with prosthetic aortic valve. The heart is normal in size. The mediastinal and hilar contours are within normal limits and unchanged. Small residual left pleural effusion with overlying basilar atelectasis. IMPRESSION: Small residual left pleural effusion with overlying basilar atelectasis. Electronically Signed   By: Marijo Sanes M.D.   On: 08/26/2022 12:20      Patient Profile     62 year old male with past medical history of hypertension, hyperlipidemia, bicuspid aortic valve status post AVR, recent pericardiocentesis for pericardial effusion for evaluation of dyspnea, fatigue and symptoms similar to those prior to recent pericardiocentesis. Patient has a history of bicuspid aortic valve and underwent aortic valve replacement with a 27 mm Edwards Inspiris Resilia aortic valve on August 04, 2022. Patient was subsequently readmitted February 9 with large pericardial effusion. He underwent pericardiocentesis and follow-up echocardiogram showed small effusion in the posterior lateral distribution. He was treated with prednisone and colchicine and discharged.  Admitted with above symptoms.  Follow-up echocardiogram showed ejection fraction 45 to 50% question from left bundle branch block, trivial pericardial effusion.  CTA showed small volume pulmonary emboli in the right upper and right middle lobe.  Assessment & Plan    1 pulmonary emboli-noted on CTA.  Likely related to recent surgery/immobility.  Issue of anticoagulation is somewhat difficult.  He needs 3 months of full  anticoagulation and then likely could be discontinued.  There is some risk with this as he had a recent pericardiocentesis secondary to pericardial effusion which was felt to be inflammatory in nature related to his recent aortic valve replacement.  The risk for apixaban would be hemorrhagic effusion.  Would recommend continuing IV heparin today and repeating limited echocardiogram tomorrow to make sure he is not developing pericardial effusion.  Can transition to apixaban at that time with follow-up echoes as an outpatient.  2 status post recent pericardiocentesis for postoperative pericardial effusion-follow-up study yesterday showed trivial effusion.  3 status post aortic valve replacement-continue SBE prophylaxis.  4 hypertension-patient's blood pressure is controlled.  Continue present medical regimen.  5 recent nausea-colchicine decreased to 0.6 mg daily.  Will follow.  For questions or updates, please contact [place] Please consult [email or web address] for contact info under        Signed, Kirk Ruths, MD  08/27/2022, 9:06 AM

## 2022-08-27 NOTE — Progress Notes (Signed)
ANTICOAGULATION CONSULT NOTE - Follow Up Consult  Pharmacy Consult for heparin Indication: pulmonary embolus   Labs: Recent Labs    08/26/22 1206 08/26/22 2101 08/27/22 0110  HGB 12.3*  --  11.6*  HCT 38.1*  --  35.5*  PLT 538*  --  464*  LABPROT 13.9  --   --   INR 1.1  --   --   HEPARINUNFRC  --   --  0.24*  CREATININE 1.01  --   --   TROPONINIHS 14 22*  --     Assessment: 61yo male subtherapeutic on heparin with initial dosing for PE; no infusion issues or signs of bleeding per RN.  Goal of Therapy:  Heparin level 0.3-0.7 units/ml   Plan:  Will increase heparin infusion by 2 units/kg/hr to 1800 units/hr and check level in 6 hours.    Wynona Neat, PharmD, BCPS  08/27/2022,2:07 AM

## 2022-08-27 NOTE — Progress Notes (Signed)
Patient develop shortness of breathe and rt chest discomfort,vitals checked,placed on 2L [place],refuse to take pain medicine,MD made aware

## 2022-08-27 NOTE — Progress Notes (Signed)
PROGRESS NOTE    Craig Alvarez  [name] DOB: Apr 20, 1961 DOA: 08/26/2022 PCP: Raina Mina., MD    Brief Narrative:  62 year old with severe aortic stenosis status post bioprosthetic AVR on 1/26, 2/9-2/13 admitted with large pericardial effusion, 900 cc of bloody fluid.  Negative cultures.  Negative for malignancy.  Discharged on colchicine and prednisone taper. Comes with progressive shortness of breath and fatigue, intermittent nausea for last 3 days.  In the emergency room found to have stable ejection fraction, found to have small volume pulmonary emboli right upper and middle lobe with minimal clot burden.  With recent hemopericardium, started on heparin and needed monitoring in the hospital.   Assessment & Plan:   Acute right-sided pulmonary embolism without cor pulmonale: Patient recently suffered from hemopericardium.  Echocardiogram 2/17 without much pericardial effusion. High risk with anticoagulation for recurrence of hemopericardium.  Currently remains stable.  Monitor on heparin another 24 hours before transitioning to oral anticoagulant. Cardiology following. Limited echocardiogram tomorrow.  Recent hemopericardium: As above.  Decreasing colchicine dose to 0.6 mg daily.  Essential hypertension: Blood pressure stable on Lopressor and lisinopril.  Coronary artery disease: On aspirin, atorvastatin, Zetia and Lopressor.  Can likely come off aspirin when goes on Eliquis.    DVT prophylaxis: Heparin infusion   Code Status: Full code Family Communication: None at the bedside Disposition Plan: Status is: Observation The patient will require care spanning > 2 midnights and should be moved to inpatient because: High risk of bleeding, remains on heparin infusion for monitoring.     Consultants:  Cardiology  Procedures:  None  Antimicrobials:  None   Subjective: Patient examined in the morning rounds.  Today mostly feels better.  He had some mild nausea.   Denies any chest pain or shortness of breath.  Objective: Vitals:   08/27/22 0726 08/27/22 0834 08/27/22 0835 08/27/22 0956  BP: 109/81 113/73  105/77  Pulse: 94  (!) 106 96  Resp: 19  (!) 22 19  Temp: 98 F (36.7 C)   98.3 F (36.8 C)  TempSrc: Oral   Oral  SpO2: 97%   98%  Weight:      Height:        Intake/Output Summary (Last 24 hours) at 08/27/2022 1139 Last data filed at 08/27/2022 0830 Gross per 24 hour  Intake 1616 ml  Output 600 ml  Net 1016 ml   Filed Weights   08/26/22 1118  Weight: 92.5 kg    Examination:  General exam: Appears calm and comfortable at rest. Respiratory system: Clear to auscultation. Respiratory effort normal.  No added sounds. Cardiovascular system: S1 & S2 heard, RRR.  No pedal edema.  Midline sternotomy incision clean and dry. Gastrointestinal system: Abdomen is nondistended, soft and nontender. No organomegaly or masses felt. Normal bowel sounds heard. Central nervous system: Alert and oriented. No focal neurological deficits. Extremities: Symmetric 5 x 5 power. Skin: No rashes, lesions or ulcers Psychiatry: Judgement and insight appear normal. Mood & affect appropriate.     Data Reviewed: I have personally reviewed following labs and imaging studies  CBC: Recent Labs  Lab 08/26/22 1206 08/27/22 0110  WBC 7.9 7.1  NEUTROABS 5.4  --   HGB 12.3* 11.6*  HCT 38.1* 35.5*  MCV 93.8 92.9  PLT 538* [id number]*   Basic Metabolic Panel: Recent Labs  Lab 08/26/22 1206 08/27/22 0110  NA 131* 132*  K 4.2 3.6  CL 98 99  CO2 23 26  GLUCOSE 106* 131*  BUN 26*  19  CREATININE 1.01 0.91  CALCIUM 9.6 8.9  MG 2.2  --    GFR: Estimated Creatinine Clearance: 93.6 mL/min (by C-G formula based on SCr of 0.91 mg/dL). Liver Function Tests: Recent Labs  Lab 08/26/22 1206  AST 26  ALT 42  ALKPHOS 96  BILITOT 0.8  PROT 6.6  ALBUMIN 3.7   Recent Labs  Lab 08/26/22 1206  LIPASE 40   No results for input(s): "AMMONIA" in the last 168  hours. Coagulation Profile: Recent Labs  Lab 08/26/22 1206  INR 1.1   Cardiac Enzymes: No results for input(s): "CKTOTAL", "CKMB", "CKMBINDEX", "TROPONINI" in the last 168 hours. BNP (last 3 results) Recent Labs    05/26/22 1444  PROBNP 469*   HbA1C: No results for input(s): "HGBA1C" in the last 72 hours. CBG: Recent Labs  Lab 08/26/22 1223  GLUCAP 108*   Lipid Profile: No results for input(s): "CHOL", "HDL", "LDLCALC", "TRIG", "CHOLHDL", "LDLDIRECT" in the last 72 hours. Thyroid Function Tests: No results for input(s): "TSH", "T4TOTAL", "FREET4", "T3FREE", "THYROIDAB" in the last 72 hours. Anemia Panel: No results for input(s): "VITAMINB12", "FOLATE", "FERRITIN", "TIBC", "IRON", "RETICCTPCT" in the last 72 hours. Sepsis Labs: No results for input(s): "PROCALCITON", "LATICACIDVEN" in the last 168 hours.  Recent Results (from the past 240 hour(s))  Resp panel by RT-PCR (RSV, Flu A&B, Covid) Anterior Nasal Swab     Status: None   Collection Time: 08/18/22  2:22 PM   Specimen: Anterior Nasal Swab  Result Value Ref Range Status   SARS Coronavirus 2 by RT PCR NEGATIVE NEGATIVE Final   Influenza A by PCR NEGATIVE NEGATIVE Final   Influenza B by PCR NEGATIVE NEGATIVE Final    Comment: (NOTE) The Xpert Xpress SARS-CoV-2/FLU/RSV plus assay is intended as an aid in the diagnosis of influenza from Nasopharyngeal swab specimens and should not be used as a sole basis for treatment. Nasal washings and aspirates are unacceptable for Xpert Xpress SARS-CoV-2/FLU/RSV testing.  Fact Sheet for Patients: EntrepreneurPulse.com.au  Fact Sheet for Healthcare Providers: IncredibleEmployment.be  This test is not yet approved or cleared by the [place] FDA and has been authorized for detection and/or diagnosis of SARS-CoV-2 by FDA under an Emergency Use Authorization (EUA). This EUA will remain in effect (meaning this test can be used) for the  duration of the COVID-19 declaration under Section 564(b)(1) of the Act, 21 U.S.C. section 360bbb-3(b)(1), unless the authorization is terminated or revoked.     Resp Syncytial Virus by PCR NEGATIVE NEGATIVE Final    Comment: (NOTE) Fact Sheet for Patients: EntrepreneurPulse.com.au  Fact Sheet for Healthcare Providers: IncredibleEmployment.be  This test is not yet approved or cleared by the [place] FDA and has been authorized for detection and/or diagnosis of SARS-CoV-2 by FDA under an Emergency Use Authorization (EUA). This EUA will remain in effect (meaning this test can be used) for the duration of the COVID-19 declaration under Section 564(b)(1) of the Act, 21 U.S.C. section 360bbb-3(b)(1), unless the authorization is terminated or revoked.  Performed at [facility], [place] [address], [place], [place] [address]   Culture, body fluid w Gram Stain-bottle     Status: None   Collection Time: 08/18/22  5:34 PM   Specimen: Pericardial  Result Value Ref Range Status   Specimen Description PERICARDIAL  Final   Special Requests NONE  Final   Culture   Final    NO GROWTH 5 DAYS Performed at [facility] [address] [place], [place]  [id number]    Report Status 08/23/2022 FINAL  Final  Gram stain     Status: None   Collection Time: 08/18/22  5:34 PM   Specimen: Pericardial  Result Value Ref Range Status   Specimen Description PERICARDIAL  Final   Special Requests NONE  Final   Gram Stain   Final    NO WBC SEEN NO ORGANISMS SEEN Performed at [facility], [address], [place], [place] [address]    Report Status 08/18/2022 FINAL  Final  MRSA Next Gen by PCR, Nasal     Status: None   Collection Time: 08/18/22  6:19 PM   Specimen: Nasal Mucosa; Nasal Swab  Result Value Ref Range Status   MRSA by PCR Next Gen NOT DETECTED NOT DETECTED Final    Comment: (NOTE) The GeneXpert MRSA Assay (FDA approved for NASAL  specimens only), is one component of a comprehensive MRSA colonization surveillance program. It is not intended to diagnose MRSA infection nor to guide or monitor treatment for MRSA infections. Test performance is not FDA approved in patients less than 98 years old. Performed at [facility], [place] [address], [place], [place] [address]          Radiology Studies: CT Angio Chest PE W and/or Wo Contrast  Result Date: 08/26/2022 CLINICAL DATA:  Short of breath, RIGHT chest pain EXAM: CT ANGIOGRAPHY CHEST WITH CONTRAST TECHNIQUE: Multidetector CT imaging of the chest was performed using the standard protocol during bolus administration of intravenous contrast. Multiplanar CT image reconstructions and MIPs were obtained to evaluate the vascular anatomy. RADIATION DOSE REDUCTION: This exam was performed according to the departmental dose-optimization program which includes automated exposure control, adjustment of the mA and/or kV according to patient size and/or use of iterative reconstruction technique. CONTRAST:  44m OMNIPAQUE IOHEXOL 350 MG/ML SOLN COMPARISON:  None Available. FINDINGS: Cardiovascular: Small intraluminal filling defect within the posterior RIGHT upper lobe pulmonary artery (image 124/series 6). Filling defect within the proximal RIGHT middle lobe pulmonary artery (image 135/6). Overall clot burden is minimal. No evidence of RIGHT ventricular strain. Minimal pericardial thickening.  No measurable pericardial fluid. Midline sternotomy post CABG. Mediastinum/Nodes: Esophagus and trachea normal. Lungs/Pleura: No pulmonary infarction. Ground-glass geographic region in the RIGHT middle lobe (image 78/7) mild LEFT basilar atelectasis. Upper Abdomen: Limited view of the liver, kidneys, pancreas are unremarkable. Normal adrenal glands. Musculoskeletal: There is a fracture through the medial aspect of the strands of fracture the posteromedial aspect of the RIGHT first rib (image 79/8 and  image 13/5. Mild periosteal reaction suggest subacute fracture Review of the MIP images confirms the above findings. IMPRESSION: 1. Small volume thrombo pulmonary emboli within the RIGHT upper lobe and RIGHT middle lobe. No RIGHT ventricular strain. Overall clot burden is minimal. 2. No pulmonary infarction. Ground-glass density in the RIGHT middle lobe is favored inflammatory. 3. Fracture of the posterior medial RIGHT first rib. Favor subacute fracture. Recommend clinical correlation with chest trauma. Electronically Signed   By: SSuzy BouchardM.D.   On: 08/26/2022 16:56   ECHOCARDIOGRAM LIMITED  Result Date: 08/26/2022    ECHOCARDIOGRAM LIMITED REPORT   Patient Name:   Craig GOEBELDate of Exam: 08/26/2022 Medical Rec #:  [id number]   Height:       72.0 in Accession #:    [id number]  Weight:       204.0 lb Date of Birth:  807/24/1962    BSA:  2.149 m Patient Age:    64 years     BP:           113/78 mmHg Patient Gender: M            HR:           99 bpm. Exam Location:  Inpatient Procedure: Limited Echo and Limited Color Doppler Indications:    Pericardial effusion I31.3  History:        Patient has prior history of Echocardiogram examinations, most                 recent 08/19/2022. CAD; Risk Factors:Hypertension and                 Dyslipidemia. Bicuspid Aortic Valve.  Sonographer:    Ronny Flurry Referring Phys: [id number] Lily Kocher IMPRESSIONS  1. EF appears to be 45-50% with abnormal septal motion ? from LBBB.  2. Limited echo for pericardial effuson the previously seen moderate posterior lateral effusion on TTE 08/19/22 is improved now being trivial.  3. 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 FINDINGS  Additional Comments: Limited echo for pericardial effuson the previously seen moderate posterior lateral effusion on TTE 08/19/22 is improved now being trivial. 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 EF appears to  be 45-50% with abnormal septal motion ? from LBBB.  Jenkins Rouge MD Electronically signed by Jenkins Rouge MD Signature Date/Time: 08/26/2022/3:09:59 PM    Final    DG Chest Portable 1 View  Result Date: 08/26/2022 CLINICAL DATA:  Chest pain EXAM: PORTABLE CHEST 1 VIEW COMPARISON:  08/19/2022 FINDINGS: Stable surgical changes with prosthetic aortic valve. The heart is normal in size. The mediastinal and hilar contours are within normal limits and unchanged. Small residual left pleural effusion with overlying basilar atelectasis. IMPRESSION: Small residual left pleural effusion with overlying basilar atelectasis. Electronically Signed   By: Marijo Sanes M.D.   On: 08/26/2022 12:20        Scheduled Meds:  aspirin  81 mg Oral q AM   atorvastatin  20 mg Oral QPM   colchicine  0.6 mg Oral Daily   ezetimibe  10 mg Oral Daily   lisinopril  20 mg Oral q AM   metoprolol tartrate  50 mg Oral BID   rOPINIRole  0.5 mg Oral QHS   sodium chloride flush  3 mL Intravenous Q12H   Continuous Infusions:  heparin 1,800 Units/hr (08/27/22 0934)     LOS: 0 days    Time spent: 35 minutes    Barb Merino, MD [facility] Pager [phone]

## 2022-08-28 ENCOUNTER — Inpatient Hospital Stay (HOSPITAL_COMMUNITY): Payer: 59

## 2022-08-28 ENCOUNTER — Other Ambulatory Visit (HOSPITAL_COMMUNITY): Payer: Self-pay

## 2022-08-28 DIAGNOSIS — I3139 Other pericardial effusion (noninflammatory): Secondary | ICD-10-CM

## 2022-08-28 DIAGNOSIS — I2782 Chronic pulmonary embolism: Secondary | ICD-10-CM

## 2022-08-28 LAB — HEPARIN LEVEL (UNFRACTIONATED)
Heparin Unfractionated: 0.46 IU/mL (ref 0.30–0.70)
Heparin Unfractionated: 0.88 IU/mL — ABNORMAL HIGH (ref 0.30–0.70)
Heparin Unfractionated: 0.51 IU/mL (ref 0.30–0.70)

## 2022-08-28 LAB — ECHOCARDIOGRAM LIMITED
Height: 72 in
S' Lateral: 2.3 cm
Weight: 3264 oz

## 2022-08-28 LAB — CBC
HCT: 35.4 % — ABNORMAL LOW (ref 39.0–52.0)
Hemoglobin: 12.1 g/dL — ABNORMAL LOW (ref 13.0–17.0)
MCH: 30.9 pg (ref 26.0–34.0)
MCHC: 34.2 g/dL (ref 30.0–36.0)
MCV: 90.5 fL (ref 80.0–100.0)
Platelets: 274 10*3/uL (ref 150–400)
RBC: 3.91 MIL/uL — ABNORMAL LOW (ref 4.22–5.81)
RDW: 13.2 % (ref 11.5–15.5)
WBC: 5.4 10*3/uL (ref 4.0–10.5)
nRBC: 0 % (ref 0.0–0.2)

## 2022-08-28 MED ORDER — OXYCODONE HCL 5 MG PO TABS: 5.0000 mg | ORAL_TABLET | Freq: Four times a day (QID) | ORAL | 0 refills | Status: AC | PRN

## 2022-08-28 MED ORDER — ONDANSETRON HCL 4 MG PO TABS: 4.0000 mg | ORAL_TABLET | Freq: Four times a day (QID) | ORAL | 0 refills | Status: AC | PRN

## 2022-08-28 MED ORDER — APIXABAN 5 MG PO TABS
10.0000 mg | ORAL_TABLET | Freq: Once | ORAL | Status: AC
  Administered 2022-08-28: 10 mg via ORAL
  Filled 2022-08-28: qty 2

## 2022-08-28 MED ORDER — APIXABAN (ELIQUIS) VTE STARTER PACK (10MG AND 5MG): ORAL_TABLET | ORAL | 0 refills | Status: DC

## 2022-08-28 MED ORDER — APIXABAN (ELIQUIS) VTE STARTER PACK (10MG AND 5MG)
ORAL_TABLET | ORAL | 0 refills | Status: DC
  Filled 2022-08-28: qty 74, 30d supply, fill #0

## 2022-08-28 MED ORDER — COLCHICINE 0.6 MG PO TABS: 0.6000 mg | ORAL_TABLET | Freq: Every day | ORAL | 0 refills | Status: DC

## 2022-08-28 NOTE — TOC Initial Note (Signed)
Transition of Care [facility]) - Initial/Assessment Note    Patient Details  Name: Craig Alvarez MRN: [id number] Date of Birth: 1961-04-17  [facility]) CM/SW Contact:    Pollie Friar, RN Phone Number: 08/28/2022, 3:41 PM  Clinical Narrative:                 Pt is still on IV heparin gtt. See pharmacy note for co pays on DOAC for home.  ToC following for further d/c needs.  Expected Discharge Plan: Home/Self Care Barriers to Discharge: Continued Medical Work up   Patient Goals and CMS Choice            Expected Discharge Plan and Services       Living arrangements for the past 2 months: Single Family Home                                      Prior Living Arrangements/Services Living arrangements for the past 2 months: Single Family Home                     Activities of Daily Living      Permission Sought/Granted                  Emotional Assessment              Admission diagnosis:  Pulmonary emboli ([place]) [I26.99] Multiple subsegmental pulmonary emboli without acute cor pulmonale ([facility]) [I26.94] Pulmonary embolism ([place]) [I26.99] Patient Active Problem List   Diagnosis Date Noted   Pulmonary embolism ([place]) 08/27/2022   Pulmonary emboli ([place]) 08/26/2022   Pericardial effusion 08/18/2022   S/P AVR (aortic valve replacement) 08/04/2022   Coronary artery disease 07/13/2022   Depression 05/24/2022   Hypercholesteremia 02/14/2021   Acute respiratory disease due to COVID-19 virus 04/26/2020   Bicuspid aortic valve 01/21/2019   Essential hypertension 01/21/2019   Mixed hyperlipidemia 01/21/2019   Insomnia 01/21/2019   Anxiety 01/21/2019   Restless legs syndrome (RLS) 01/21/2019   Tear of meniscus of knee 12/27/2018   Unstable knee 11/20/2018   Chronic bilateral low back pain without sciatica 12/12/2017   Chronic pain of left knee 12/12/2017   Dyslipidemia 12/21/2015   Nonrheumatic aortic valve stenosis 12/21/2015   ED  (erectile dysfunction) 11/04/2015   Prediabetes 11/04/2015   PCP:  Raina Mina., MD Pharmacy:   [id number] [place], [place] [address]Phone: [phone]: [phone] [facility] [address]: [phone]: [phone]    Social Determinants of Health (SDOH) Social History: SDOH Screenings   Food Insecurity: No Food Insecurity (08/18/2022)  Housing: Low Risk  (08/18/2022)  Transportation Needs: No Transportation Needs (08/18/2022)  Utilities: Not At Risk (08/18/2022)  Tobacco Use: Medium Risk (08/26/2022)   SDOH Interventions:     Readmission Risk Interventions     No data to display

## 2022-08-28 NOTE — Progress Notes (Signed)
  Echocardiogram 2D Echocardiogram has been performed.  Wynelle Link 08/28/2022, 2:58 PM

## 2022-08-28 NOTE — Discharge Instructions (Signed)
Information on my medicine - ELIQUIS (apixaban)  This medication education was reviewed with me or my healthcare representative as part of my discharge preparation.    Why was Eliquis prescribed for you? Eliquis was prescribed to treat blood clots that may have been found in the veins of your legs (deep vein thrombosis) or in your lungs (pulmonary embolism) and to reduce the risk of them occurring again.  What do You need to know about Eliquis ? The starting dose is 10 mg (two 5 mg tablets) taken TWICE daily for the FIRST SEVEN (7) DAYS, then on 09/04/22  the dose is reduced to ONE 5 mg tablet taken TWICE daily.  Eliquis may be taken with or without food.   Try to take the dose about the same time in the morning and in the evening. If you have difficulty swallowing the tablet whole please discuss with your pharmacist how to take the medication safely.  Take Eliquis exactly as prescribed and DO NOT stop taking Eliquis without talking to the doctor who prescribed the medication.  Stopping may increase your risk of developing a new blood clot.  Refill your prescription before you run out.  After discharge, you should have regular check-up appointments with your healthcare provider that is prescribing your Eliquis.    What do you do if you miss a dose? If a dose of ELIQUIS is not taken at the scheduled time, take it as soon as possible on the same day and twice-daily administration should be resumed. The dose should not be doubled to make up for a missed dose.  Important Safety Information A possible side effect of Eliquis is bleeding. You should call your healthcare provider right away if you experience any of the following: Bleeding from an injury or your nose that does not stop. Unusual colored urine (red or dark brown) or unusual colored stools (red or black). Unusual bruising for unknown reasons. A serious fall or if you hit your head (even if there is no bleeding).  Some  medicines may interact with Eliquis and might increase your risk of bleeding or clotting while on Eliquis. To help avoid this, consult your healthcare provider or pharmacist prior to using any new prescription or non-prescription medications, including herbals, vitamins, non-steroidal anti-inflammatory drugs (NSAIDs) and supplements.  This website has more information on Eliquis (apixaban): http://www.eliquis.com/eliquis/home

## 2022-08-28 NOTE — Progress Notes (Signed)
Mobility Specialist Progress Note:   08/28/22 1052  Mobility  Activity Ambulated independently in hallway  Level of Assistance Modified independent, requires aide device or extra time  Assistive Device Other (Comment) (IV Pole)  Distance Ambulated (ft) 550 ft  Activity Response Tolerated well  Mobility Referral Yes  $Mobility charge 1 Mobility   Pt received in chair and agreeable. No complaints. Pt returned to chair with all needs met and call bell in reach.   Andrey Campanile Mobility Specialist Please contact via SecureChat or  Rehab office at [phone]

## 2022-08-28 NOTE — Progress Notes (Signed)
ANTICOAGULATION CONSULT NOTE - Follow Up Consult  Pharmacy Consult for heparin Indication: pulmonary embolus   Labs: Recent Labs    08/26/22 1206 08/26/22 2101 08/27/22 0110 08/27/22 0748 08/27/22 1503 08/27/22 1732 08/28/22 0114  HGB 12.3*  --  11.6*  --   --   --  12.1*  HCT 38.1*  --  35.5*  --   --   --  35.4*  PLT 538*  --  464*  --   --   --  274  LABPROT 13.9  --   --   --   --   --   --   INR 1.1  --   --   --   --   --   --   HEPARINUNFRC  --   --  0.24*   < > <0.10* 0.72* 0.46  CREATININE 1.01  --  0.91  --   --   --   --   TROPONINIHS 14 22*  --   --   --   --   --    < > = values in this interval not displayed.    Assessment/Plan:  62yo male therapeutic on heparin after rate change. Will continue infusion at current rate of 1700 units/hr and confirm stable with additional level.   Wynona Neat, PharmD, BCPS  08/28/2022,3:22 AM

## 2022-08-28 NOTE — Progress Notes (Addendum)
ANTICOAGULATION CONSULT NOTE - Follow-Up Consult  Pharmacy Consult for Heparin>>apixaban Indication: pulmonary embolus  No Known Allergies  Patient Measurements: Height: 6' (182.9 cm) Weight: 92.5 kg (204 lb) IBW/kg (Calculated) : 77.6 Heparin Dosing Weight: 92.5kg  Vital Signs: Temp: 98 F (36.7 C) (02/19 1516) Temp Source: Oral (02/19 1516) BP: 110/63 (02/19 1516) Pulse Rate: 92 (02/19 1516)  Labs: Recent Labs    08/26/22 1206 08/26/22 2101 08/27/22 0110 08/27/22 0748 08/28/22 0114 08/28/22 0733 08/28/22 1503  HGB 12.3*  --  11.6*  --  12.1*  --   --   HCT 38.1*  --  35.5*  --  35.4*  --   --   PLT 538*  --  464*  --  274  --   --   LABPROT 13.9  --   --   --   --   --   --   INR 1.1  --   --   --   --   --   --   HEPARINUNFRC  --   --  0.24*   < > 0.46 0.88* 0.51  CREATININE 1.01  --  0.91  --   --   --   --   TROPONINIHS 14 22*  --   --   --   --   --    < > = values in this interval not displayed.     Estimated Creatinine Clearance: 93.6 mL/min (by C-G formula based on SCr of 0.91 mg/dL).  Medications:  Medications Prior to Admission  Medication Sig Dispense Refill Last Dose   ALPRAZolam (XANAX) 1 MG tablet Take 1 mg by mouth at bedtime as needed for sleep.   Past Week   aspirin 81 MG chewable tablet Chew 81 mg by mouth in the morning.   08/26/2022   atorvastatin (LIPITOR) 20 MG tablet Take 20 mg by mouth every evening.   08/25/2022   CALCIUM-VITAMIN D PO Take 1 tablet by mouth daily.   08/26/2022   ezetimibe (ZETIA) 10 MG tablet Take 1 tablet (10 mg total) by mouth daily. 90 tablet 3 08/26/2022   ibuprofen (ADVIL) 200 MG tablet Take 400 mg by mouth every 8 (eight) hours as needed for moderate pain.   Past Month   lisinopril (ZESTRIL) 20 MG tablet Take 20 mg by mouth in the morning.   08/26/2022   loratadine (CLARITIN) 10 MG tablet Take 10 mg by mouth daily as needed for allergies.   Past Month   metoprolol tartrate (LOPRESSOR) 50 MG tablet Take 1 tablet (50 mg  total) by mouth 2 (two) times daily. 60 tablet 5 08/26/2022 at 0800   Multiple Vitamin (MULTIVITAMIN) tablet Take 1 tablet by mouth daily.   08/26/2022   Omega-3 1000 MG CAPS Take 1,000 mg by mouth in the morning.   08/26/2022   OVER THE COUNTER MEDICATION Take 2 capsules by mouth at bedtime. Medication:Relaxium   08/25/2022   rOPINIRole (REQUIP) 0.5 MG tablet Take 0.5 mg by mouth at bedtime.   08/25/2022   sildenafil (REVATIO) 20 MG tablet Take 60-80 mg by mouth daily as needed (erectile dysfunction).   Past Month   [DISCONTINUED] colchicine 0.6 MG tablet Take 1 tablet (0.6 mg total) by mouth 2 (two) times daily. 180 tablet 0 08/26/2022   amoxicillin (AMOXIL) 500 MG tablet Take 2,000 mg by mouth See admin instructions. Take 2000 mg by mouth1 hour before dental procedures      Scheduled:   aspirin  81  mg Oral q AM   atorvastatin  20 mg Oral QPM   colchicine  0.6 mg Oral Daily   ezetimibe  10 mg Oral Daily   lisinopril  20 mg Oral q AM   metoprolol tartrate  50 mg Oral BID   rOPINIRole  0.5 mg Oral QHS   sodium chloride flush  3 mL Intravenous Q12H   Infusions:   heparin 1,500 Units/hr (08/28/22 [id number])    Assessment: 64 yom with a history of AS, bicuspid aortic valve s/p surgical aortic valve replacement 07/2022, HTN, HLD. Patient also with recent history of bloody pericardial effusion s/p pericardiocentesis. Patient is presenting with chest pain. Patient is not on anticoagulation prior to arrival. Heparin per pharmacy consult placed for pulmonary embolus.  CTA PE "Small volume thrombo pulmonary emboli within the RIGHT upper lobe and RIGHT middle lobe. No RIGHT ventricular strain. Overall clot burden is minimal."  Heparin level today is supra-therapeutic at 0.88 on 1700 units/hr - accumulated some from prior level. Hgb 12.1, plt 464>>274 (trend down). No line issues or signs/symptoms of bleeding reported.  Heparin came back therapeutic this PM. We will continue with the current rate.    Addendum  Change to apixaban for discharge. Pt has discharge supply of apixaban with $0 copay.   Goal of Therapy:  Heparin level 0.3-0.7 units/ml Monitor platelets by anticoagulation protocol: Yes   Plan:  Dc heparin Apixaban 73m BID x7d then 577mBID Educ done  MiOnnie BoerPharmD, [email or web address], CPP Infectious Disease Pharmacist 08/28/2022 4:03 PM

## 2022-08-28 NOTE — TOC Benefit Eligibility Note (Signed)
Patient Teacher, English as a foreign language completed.    The patient is currently admitted and upon discharge could be taking Eliquis 5 mg.  The current 30 day co-pay is $0.00.   The patient is currently admitted and upon discharge could be taking Xarelto 20 mg.  The current 30 day co-pay is $0.00.   The patient is insured through [place], [place] Patient [employer] Patient Advocate Team Direct Number: [phone]  Fax: [phone]

## 2022-08-28 NOTE — Progress Notes (Signed)
Rounding Note    Patient Name: Craig Alvarez Date of Encounter: 08/28/2022  [facility] Cardiologist: Shirlee More, MD   Subjective   Denies any chest pain.  No significant dyspnea.  Inpatient Medications    Scheduled Meds:  aspirin  81 mg Oral q AM   atorvastatin  20 mg Oral QPM   colchicine  0.6 mg Oral Daily   ezetimibe  10 mg Oral Daily   lisinopril  20 mg Oral q AM   metoprolol tartrate  50 mg Oral BID   rOPINIRole  0.5 mg Oral QHS   sodium chloride flush  3 mL Intravenous Q12H   Continuous Infusions:  heparin 1,500 Units/hr (08/28/22 0923)   PRN Meds: acetaminophen **OR** acetaminophen, ALPRAZolam, fentaNYL (SUBLIMAZE) injection, ondansetron **OR** ondansetron (ZOFRAN) IV, oxyCODONE   Vital Signs    Vitals:   08/27/22 2357 08/28/22 0304 08/28/22 0605 08/28/22 0831  BP: 101/62 93/70 102/84 117/82  Pulse: 83 77 87 96  Resp: 19 18 19 20  $ Temp: 98.3 F (36.8 C) 98 F (36.7 C)  98 F (36.7 C)  TempSrc: Oral Oral  Oral  SpO2: 98% 97% 95% 99%  Weight:      Height:        Intake/Output Summary (Last 24 hours) at 08/28/2022 1102 Last data filed at 08/28/2022 1038 Gross per 24 hour  Intake 573.65 ml  Output 2475 ml  Net -1901.35 ml      08/26/2022   11:18 AM 08/21/2022    6:45 AM 08/19/2022    4:00 AM  Last 3 Weights  Weight (lbs) 204 lb 212 lb 1.3 oz 227 lb 11.8 oz  Weight (kg) 92.534 kg 96.2 kg 103.3 kg      Telemetry    Sinus rhythm 98- Personally Reviewed  ECG    No new- Personally Reviewed  Physical Exam   GEN: No acute distress.   Neck: No JVD Cardiac: RRR, no murmurs, rubs, or gallops.  CABG scar noted Respiratory: Clear to auscultation bilaterally. GI: Soft, nontender, non-distended  MS: No edema; No deformity. Neuro:  Nonfocal  Psych: Normal affect   Labs    High Sensitivity Troponin:   Recent Labs  Lab 08/18/22 1421 08/26/22 1206 08/26/22 2101  TROPONINIHS 18* 14 22*     Chemistry Recent Labs  Lab  08/26/22 1206 08/27/22 0110  NA 131* 132*  K 4.2 3.6  CL 98 99  CO2 23 26  GLUCOSE 106* 131*  BUN 26* 19  CREATININE 1.01 0.91  CALCIUM 9.6 8.9  MG 2.2  --   PROT 6.6  --   ALBUMIN 3.7  --   AST 26  --   ALT 42  --   ALKPHOS 96  --   BILITOT 0.8  --   GFRNONAA >60 >60  ANIONGAP 10 7    Lipids No results for input(s): "CHOL", "TRIG", "HDL", "LABVLDL", "LDLCALC", "CHOLHDL" in the last 168 hours.  Hematology Recent Labs  Lab 08/26/22 1206 08/27/22 0110 08/28/22 0114  WBC 7.9 7.1 5.4  RBC 4.06* 3.82* 3.91*  HGB 12.3* 11.6* 12.1*  HCT 38.1* 35.5* 35.4*  MCV 93.8 92.9 90.5  MCH 30.3 30.4 30.9  MCHC 32.3 32.7 34.2  RDW 13.3 13.3 13.2  PLT 538* 464* 274   Thyroid No results for input(s): "TSH", "FREET4" in the last 168 hours.  BNP Recent Labs  Lab 08/26/22 1206  BNP 89.4    DDimer No results for input(s): "DDIMER" in the last 168  hours.   Radiology    CT Angio Chest PE W and/or Wo Contrast  Result Date: 08/26/2022 CLINICAL DATA:  Short of breath, RIGHT chest pain EXAM: CT ANGIOGRAPHY CHEST WITH CONTRAST TECHNIQUE: Multidetector CT imaging of the chest was performed using the standard protocol during bolus administration of intravenous contrast. Multiplanar CT image reconstructions and MIPs were obtained to evaluate the vascular anatomy. RADIATION DOSE REDUCTION: This exam was performed according to the departmental dose-optimization program which includes automated exposure control, adjustment of the mA and/or kV according to patient size and/or use of iterative reconstruction technique. CONTRAST:  66m OMNIPAQUE IOHEXOL 350 MG/ML SOLN COMPARISON:  None Available. FINDINGS: Cardiovascular: Small intraluminal filling defect within the posterior RIGHT upper lobe pulmonary artery (image 124/series 6). Filling defect within the proximal RIGHT middle lobe pulmonary artery (image 135/6). Overall clot burden is minimal. No evidence of RIGHT ventricular strain. Minimal pericardial  thickening.  No measurable pericardial fluid. Midline sternotomy post CABG. Mediastinum/Nodes: Esophagus and trachea normal. Lungs/Pleura: No pulmonary infarction. Ground-glass geographic region in the RIGHT middle lobe (image 78/7) mild LEFT basilar atelectasis. Upper Abdomen: Limited view of the liver, kidneys, pancreas are unremarkable. Normal adrenal glands. Musculoskeletal: There is a fracture through the medial aspect of the strands of fracture the posteromedial aspect of the RIGHT first rib (image 79/8 and image 13/5. Mild periosteal reaction suggest subacute fracture Review of the MIP images confirms the above findings. IMPRESSION: 1. Small volume thrombo pulmonary emboli within the RIGHT upper lobe and RIGHT middle lobe. No RIGHT ventricular strain. Overall clot burden is minimal. 2. No pulmonary infarction. Ground-glass density in the RIGHT middle lobe is favored inflammatory. 3. Fracture of the posterior medial RIGHT first rib. Favor subacute fracture. Recommend clinical correlation with chest trauma. Electronically Signed   By: SSuzy BouchardM.D.   On: 08/26/2022 16:56   ECHOCARDIOGRAM LIMITED  Result Date: 08/26/2022    ECHOCARDIOGRAM LIMITED REPORT   Patient Name:   Craig GOUKERDate of Exam: 08/26/2022 Medical Rec #:  [id number]   Height:       72.0 in Accession #:    [id number]  Weight:       204.0 lb Date of Birth:  [birth year]    BSA:          2.149 m Patient Age:    89years     BP:           113/78 mmHg Patient Gender: M            HR:           99 bpm. Exam Location:  Inpatient Procedure: Limited Echo and Limited Color Doppler Indications:    Pericardial effusion I31.3  History:        Patient has prior history of Echocardiogram examinations, most                 recent 08/19/2022. CAD; Risk Factors:Hypertension and                 Dyslipidemia. Bicuspid Aortic Valve.  Sonographer:    SRonny FlurryReferring Phys: [id number] KocherIMPRESSIONS  1. EF appears to be 45-50% with  abnormal septal motion ? from LBBB.  2. Limited echo for pericardial effuson the previously seen moderate posterior lateral effusion on TTE 08/19/22 is improved now being trivial.  3. 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 FINDINGS  Additional Comments: Limited echo for pericardial effuson the previously seen  moderate posterior lateral effusion on TTE 08/19/22 is improved now being trivial. 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 EF appears to be 45-50% with abnormal septal motion ? from LBBB.  Jenkins Rouge MD Electronically signed by Jenkins Rouge MD Signature Date/Time: 08/26/2022/3:09:59 PM    Final    DG Chest Portable 1 View  Result Date: 08/26/2022 CLINICAL DATA:  Chest pain EXAM: PORTABLE CHEST 1 VIEW COMPARISON:  08/19/2022 FINDINGS: Stable surgical changes with prosthetic aortic valve. The heart is normal in size. The mediastinal and hilar contours are within normal limits and unchanged. Small residual left pleural effusion with overlying basilar atelectasis. IMPRESSION: Small residual left pleural effusion with overlying basilar atelectasis. Electronically Signed   By: Marijo Sanes M.D.   On: 08/26/2022 12:20    Cardiac Studies   New ECHO pending  Patient Profile     62 y.o. male with recent right upper and middle lobe PE with recent bicuspid aortic valve replacement on 08/04/2022 with 27 mm Edwards Inspiris Resilia aortic valve.  Small volume clot burden noted on CT scan.  Assessment & Plan    Pulmonary emboli -Postop. - Anticoagulated currently with IV heparin.  Small volume.  PE. -Repeating echocardiogram today.  We want to make sure that he does not have any worsening pericardial effusion or bloody effusion with anticoagulation.  He has had recent pericarditis/pericardiocentesis.  The concern is that with inflammatory state, he may bleed on anticoagulant. -Prior studies showed a trivial effusion.  Recent  pericarditis postoperative pericardial effusion - Repeating echocardiogram today.  Previously showed trivial effusion.  Aortic valve replacement - Prior bicuspid valve.  SBE prophylaxis.  Hypertension - Controlled.  Nausea - Colchicine decreased to 0.6 mg a day.  He is on colchicine for treatment of pericarditis.      For questions or updates, please contact [place] Please consult [email or web address] for contact info under        Signed, Candee Furbish, MD  08/28/2022, 11:02 AM

## 2022-08-28 NOTE — Progress Notes (Signed)
ANTICOAGULATION CONSULT NOTE - Follow-Up Consult  Pharmacy Consult for Heparin Indication: pulmonary embolus  No Known Allergies  Patient Measurements: Height: 6' (182.9 cm) Weight: 92.5 kg (204 lb) IBW/kg (Calculated) : 77.6 Heparin Dosing Weight: 92.5kg  Vital Signs: Temp: 98 F (36.7 C) (02/19 0304) Temp Source: Oral (02/19 0304) BP: 102/84 (02/19 0605) Pulse Rate: 87 (02/19 0605)  Labs: Recent Labs    08/26/22 1206 08/26/22 2101 08/27/22 0110 08/27/22 0748 08/27/22 1732 08/28/22 0114 08/28/22 0733  HGB 12.3*  --  11.6*  --   --  12.1*  --   HCT 38.1*  --  35.5*  --   --  35.4*  --   PLT 538*  --  464*  --   --  274  --   LABPROT 13.9  --   --   --   --   --   --   INR 1.1  --   --   --   --   --   --   HEPARINUNFRC  --   --  0.24*   < > 0.72* 0.46 0.88*  CREATININE 1.01  --  0.91  --   --   --   --   TROPONINIHS 14 22*  --   --   --   --   --    < > = values in this interval not displayed.    Estimated Creatinine Clearance: 93.6 mL/min (by C-G formula based on SCr of 0.91 mg/dL).  Medications:  Medications Prior to Admission  Medication Sig Dispense Refill Last Dose   ALPRAZolam (XANAX) 1 MG tablet Take 1 mg by mouth at bedtime as needed for sleep.   Past Week   aspirin 81 MG chewable tablet Chew 81 mg by mouth in the morning.   08/26/2022   atorvastatin (LIPITOR) 20 MG tablet Take 20 mg by mouth every evening.   08/25/2022   CALCIUM-VITAMIN D PO Take 1 tablet by mouth daily.   08/26/2022   colchicine 0.6 MG tablet Take 1 tablet (0.6 mg total) by mouth 2 (two) times daily. 180 tablet 0 08/26/2022   ezetimibe (ZETIA) 10 MG tablet Take 1 tablet (10 mg total) by mouth daily. 90 tablet 3 08/26/2022   ibuprofen (ADVIL) 200 MG tablet Take 400 mg by mouth every 8 (eight) hours as needed for moderate pain.   Past Month   lisinopril (ZESTRIL) 20 MG tablet Take 20 mg by mouth in the morning.   08/26/2022   loratadine (CLARITIN) 10 MG tablet Take 10 mg by mouth daily as  needed for allergies.   Past Month   metoprolol tartrate (LOPRESSOR) 50 MG tablet Take 1 tablet (50 mg total) by mouth 2 (two) times daily. 60 tablet 5 08/26/2022 at 0800   Multiple Vitamin (MULTIVITAMIN) tablet Take 1 tablet by mouth daily.   08/26/2022   Omega-3 1000 MG CAPS Take 1,000 mg by mouth in the morning.   08/26/2022   OVER THE COUNTER MEDICATION Take 2 capsules by mouth at bedtime. Medication:Relaxium   08/25/2022   rOPINIRole (REQUIP) 0.5 MG tablet Take 0.5 mg by mouth at bedtime.   08/25/2022   sildenafil (REVATIO) 20 MG tablet Take 60-80 mg by mouth daily as needed (erectile dysfunction).   Past Month   amoxicillin (AMOXIL) 500 MG tablet Take 2,000 mg by mouth See admin instructions. Take 2000 mg by mouth1 hour before dental procedures      Scheduled:   aspirin  81 mg Oral  q AM   atorvastatin  20 mg Oral QPM   colchicine  0.6 mg Oral Daily   ezetimibe  10 mg Oral Daily   lisinopril  20 mg Oral q AM   metoprolol tartrate  50 mg Oral BID   rOPINIRole  0.5 mg Oral QHS   sodium chloride flush  3 mL Intravenous Q12H   Infusions:   heparin 1,700 Units/hr (08/28/22 0110)    Assessment: 85 yom with a history of AS, bicuspid aortic valve s/p surgical aortic valve replacement 07/2022, HTN, HLD. Patient also with recent history of bloody pericardial effusion s/p pericardiocentesis. Patient is presenting with chest pain. Patient is not on anticoagulation prior to arrival. Heparin per pharmacy consult placed for pulmonary embolus.  CTA PE "Small volume thrombo pulmonary emboli within the RIGHT upper lobe and RIGHT middle lobe. No RIGHT ventricular strain. Overall clot burden is minimal."  Heparin level today is supra-therapeutic at 0.88 on 1700 units/hr - accumulated some from prior level. Hgb 12.1, plt 464>>274 (trend down). No line issues or signs/symptoms of bleeding reported.   Goal of Therapy:  Heparin level 0.3-0.7 units/ml Monitor platelets by anticoagulation protocol: Yes    Plan:  Decrease heparin gtt to 1500 units/hr  Recheck heparin level in 6 hours.  Monitor heparin level, CBC (watch platelet trend), s/sx of bleeding daily   Sloan Leiter, PharmD, BCPS, BCCCP Clinical Pharmacist Please refer to [facility] for [place] numbers 08/28/2022 8:30 AM

## 2022-08-28 NOTE — Discharge Summary (Signed)
Physician Discharge Summary  Craig Alvarez [id number] DOB: 09/04/1960 DOA: 08/26/2022  PCP: Raina Mina., MD  Admit date: 08/26/2022 Discharge date: 08/28/2022  Admitted From: Home Disposition: Home  Recommendations for Outpatient Follow-up:  Follow up with PCP in 1-2 weeks Cardiology to schedule follow-up   Discharge Condition: Stable CODE STATUS: Full code Diet recommendation: Low-salt diet  Discharge summary: 62 year old with severe aortic stenosis status post bioprosthetic AVR on 1/26, 2/9-2/13 admitted with large pericardial effusion, 900 cc of bloody fluid.  Negative cultures.  Negative for malignancy.  Discharged on colchicine and prednisone taper. Comes with progressive shortness of breath and fatigue, intermittent nausea for last 3 days.  In the emergency room found to have stable ejection fraction, found to have small volume pulmonary emboli right upper and middle lobe with minimal clot burden.  With recent hemopericardium, started on heparin and needed monitoring in the hospital. Patient was anticoagulated with heparin for 48 hours, repeat echocardiogram was done that did not show any evidence of recurrence of hemopericardium.  Changing to Eliquis to discharge.  Will take anticoagulation for 3 months.  # Acute right-sided pulmonary embolism without cor pulmonale: Patient recently suffered from hemopericardium.  Echocardiogram 2/17 without much pericardial effusion. Echocardiogram 2/19 with trivial pericardial effusion.  Tolerated heparin well.  Will discharge on Eliquis.   Recent hemopericardium: As above.  Decreasing colchicine dose to 0.6 mg daily.  Completed prednisone taper.   Essential hypertension: Blood pressure stable on Lopressor and lisinopril.   Coronary artery disease: On aspirin, atorvastatin, Zetia and Lopressor.   Discussed with cardiology, recommended to continue aspirin along with Eliquis due to bioprosthetic valve.  Patient was using NSAIDs for pain  relief, will prescribe a short course therapy of oxycodone as there will be risk for bleeding with NSAIDs along with Eliquis and aspirin.  Medically stable for discharge.   Discharge Diagnoses:  Principal Problem:   Pulmonary emboli [facility]) Active Problems:   Essential hypertension   Mixed hyperlipidemia   Coronary artery disease   S/P AVR (aortic valve replacement)   Pericardial effusion   Pulmonary embolism [facility])    Discharge Instructions  Discharge Instructions     Call MD for:  difficulty breathing, headache or visual disturbances   Complete by: As directed    Call MD for:  severe uncontrolled pain   Complete by: As directed    Diet - low sodium heart healthy   Complete by: As directed    Increase activity slowly   Complete by: As directed       Allergies as of 08/28/2022   No Known Allergies      Medication List     STOP taking these medications    ibuprofen 200 MG tablet Commonly known as: ADVIL       TAKE these medications    ALPRAZolam 1 MG tablet Commonly known as: XANAX Take 1 mg by mouth at bedtime as needed for sleep.   amoxicillin 500 MG tablet Commonly known as: AMOXIL Take 2,000 mg by mouth See admin instructions. Take 2000 mg by mouth1 hour before dental procedures   Apixaban Starter Pack (41m and 590m Commonly known as: ELIQUIS STARTER PACK Take as directed on package: start with two-63m43mablets twice daily for 7 days. On day 8, switch to one-63mg863mblet twice daily.   aspirin 81 MG chewable tablet Chew 81 mg by mouth in the morning.   atorvastatin 20 MG tablet Commonly known as: LIPITOR Take 20 mg by mouth every evening.  CALCIUM-VITAMIN D PO Take 1 tablet by mouth daily.   colchicine 0.6 MG tablet Take 1 tablet (0.6 mg total) by mouth daily. What changed: when to take this   ezetimibe 10 MG tablet Commonly known as: ZETIA Take 1 tablet (10 mg total) by mouth daily.   lisinopril 20 MG tablet Commonly known as:  ZESTRIL Take 20 mg by mouth in the morning.   loratadine 10 MG tablet Commonly known as: CLARITIN Take 10 mg by mouth daily as needed for allergies.   metoprolol tartrate 50 MG tablet Commonly known as: LOPRESSOR Take 1 tablet (50 mg total) by mouth 2 (two) times daily.   multivitamin tablet Take 1 tablet by mouth daily.   Omega-3 1000 MG Caps Take 1,000 mg by mouth in the morning.   ondansetron 4 MG tablet Commonly known as: ZOFRAN Take 1 tablet (4 mg total) by mouth every 6 (six) hours as needed for nausea.   OVER THE COUNTER MEDICATION Take 2 capsules by mouth at bedtime. Medication:Relaxium   oxyCODONE 5 MG immediate release tablet Commonly known as: Oxy IR/ROXICODONE Take 1 tablet (5 mg total) by mouth every 6 (six) hours as needed for up to 3 days for moderate pain.   rOPINIRole 0.5 MG tablet Commonly known as: REQUIP Take 0.5 mg by mouth at bedtime.   sildenafil 20 MG tablet Commonly known as: REVATIO Take 60-80 mg by mouth daily as needed (erectile dysfunction).         Follow-up Information     Richardson Dopp T, PA-C Follow up on 09/04/2022.   Specialties: Cardiology, Physician Assistant Why: 8:50 AM - Note this appointment is at the office in [facility] information: [place] [address] [place] [address] [place] [address] [phone]                No Known Allergies  Consultations: Cardiology   Procedures/Studies: ECHOCARDIOGRAM LIMITED  Result Date: 08/28/2022    ECHOCARDIOGRAM LIMITED REPORT   Patient Name:   Craig Alvarez Date of Exam: 08/28/2022 Medical Rec #:  [id number]    Height:       72.0 in Accession #:    [id number]   Weight:       204.0 lb Date of Birth:  09-Nov-1960     BSA:          2.149 m Patient Age:    62 years     BP:           114/80 mmHg Patient Gender: M            HR:           92 bpm. Exam Location:  Inpatient Procedure: Limited Echo, Cardiac Doppler and Limited Color Doppler Indications:    Pericardial effusion  I31.3  History:        Patient has prior history of Echocardiogram examinations, most                 recent 08/26/2022. CAD and Pericardial Disease, P>E>, Aortic                 Valve Disease; Risk Factors:Hypertension, Dyslipidemia and                 Former Smoker.                 Aortic Valve: 27 mm Edwards valve is present in the aortic                 position.  Procedure Date: 08/04/22.  Sonographer:    Greer Pickerel Referring Phys: [id number] [facility]  Sonographer Comments: Image acquisition challenging due to respiratory motion. IMPRESSIONS  1. Left ventricular ejection fraction, by estimation, is 60 to 65%. The left ventricle has normal function. The left ventricle has no regional wall motion abnormalities. There is moderate concentric left ventricular hypertrophy. Left ventricular diastolic parameters are consistent with Grade I diastolic dysfunction (impaired relaxation).  2. Right ventricular systolic function is normal. The right ventricular size is normal. Tricuspid regurgitation signal is inadequate for assessing PA pressure.  3. Bioprosthetic aortic valve. The valve was not fully assessed on this limited echo.  4. The inferior vena cava is normal in size with greater than 50% respiratory variability, suggesting right atrial pressure of 3 mmHg.  5. Trivial pericardial effusion.  6. Limited echo. FINDINGS  Left Ventricle: Left ventricular ejection fraction, by estimation, is 60 to 65%. The left ventricle has normal function. The left ventricle has no regional wall motion abnormalities. The left ventricular internal cavity size was normal in size. There is  moderate concentric left ventricular hypertrophy. Left ventricular diastolic parameters are consistent with Grade I diastolic dysfunction (impaired relaxation). Right Ventricle: The right ventricular size is normal. No increase in right ventricular wall thickness. Right ventricular systolic function is normal. Tricuspid regurgitation signal is  inadequate for assessing PA pressure. Left Atrium: Left atrial size was normal in size. Right Atrium: Right atrial size was normal in size. Pericardium: Trivial pericardial effusion is present. Tricuspid Valve: The tricuspid valve is normal in structure. Tricuspid valve regurgitation is not demonstrated. Aortic Valve: Bioprosthetic aortic valve. The valve was not fully assessed on this limited echo. There is a 27 mm Edwards valve present in the aortic position. Procedure Date: 08/04/22. Venous: The inferior vena cava is normal in size with greater than 50% respiratory variability, suggesting right atrial pressure of 3 mmHg. IAS/Shunts: No atrial level shunt detected by color flow Doppler. Additional Comments: Spectral Doppler performed. Color Doppler performed.  LEFT VENTRICLE PLAX 2D LVIDd:         4.40 cm LVIDs:         2.30 cm LV PW:         1.20 cm LV IVS:        1.20 cm  Dalton McleanMD Electronically signed by Franki Monte Signature Date/Time: 08/28/2022/4:15:41 PM    Final    CT Angio Chest PE W and/or Wo Contrast  Result Date: 08/26/2022 CLINICAL DATA:  Short of breath, RIGHT chest pain EXAM: CT ANGIOGRAPHY CHEST WITH CONTRAST TECHNIQUE: Multidetector CT imaging of the chest was performed using the standard protocol during bolus administration of intravenous contrast. Multiplanar CT image reconstructions and MIPs were obtained to evaluate the vascular anatomy. RADIATION DOSE REDUCTION: This exam was performed according to the departmental dose-optimization program which includes automated exposure control, adjustment of the mA and/or kV according to patient size and/or use of iterative reconstruction technique. CONTRAST:  55m OMNIPAQUE IOHEXOL 350 MG/ML SOLN COMPARISON:  None Available. FINDINGS: Cardiovascular: Small intraluminal filling defect within the posterior RIGHT upper lobe pulmonary artery (image 124/series 6). Filling defect within the proximal RIGHT middle lobe pulmonary artery (image  135/6). Overall clot burden is minimal. No evidence of RIGHT ventricular strain. Minimal pericardial thickening.  No measurable pericardial fluid. Midline sternotomy post CABG. Mediastinum/Nodes: Esophagus and trachea normal. Lungs/Pleura: No pulmonary infarction. Ground-glass geographic region in the RIGHT middle lobe (image 78/7) mild LEFT basilar atelectasis. Upper Abdomen: Limited view of the liver,  kidneys, pancreas are unremarkable. Normal adrenal glands. Musculoskeletal: There is a fracture through the medial aspect of the strands of fracture the posteromedial aspect of the RIGHT first rib (image 79/8 and image 13/5. Mild periosteal reaction suggest subacute fracture Review of the MIP images confirms the above findings. IMPRESSION: 1. Small volume thrombo pulmonary emboli within the RIGHT upper lobe and RIGHT middle lobe. No RIGHT ventricular strain. Overall clot burden is minimal. 2. No pulmonary infarction. Ground-glass density in the RIGHT middle lobe is favored inflammatory. 3. Fracture of the posterior medial RIGHT first rib. Favor subacute fracture. Recommend clinical correlation with chest trauma. Electronically Signed   By: Suzy Bouchard M.D.   On: 08/26/2022 16:56   ECHOCARDIOGRAM LIMITED  Result Date: 08/26/2022    ECHOCARDIOGRAM LIMITED REPORT   Patient Name:   Craig Alvarez Date of Exam: 08/26/2022 Medical Rec #:  [id number]    Height:       72.0 in Accession #:    [id number]   Weight:       204.0 lb Date of Birth:  [birth year]     BSA:          2.149 m Patient Age:    60 years     BP:           113/78 mmHg Patient Gender: M            HR:           99 bpm. Exam Location:  Inpatient Procedure: Limited Echo and Limited Color Doppler Indications:    Pericardial effusion I31.3  History:        Patient has prior history of Echocardiogram examinations, most                 recent 08/19/2022. CAD; Risk Factors:Hypertension and                 Dyslipidemia. Bicuspid Aortic Valve.  Sonographer:     Ronny Flurry Referring Phys: [id number] Lily Kocher IMPRESSIONS  1. EF appears to be 45-50% with abnormal septal motion ? from LBBB.  2. Limited echo for pericardial effuson the previously seen moderate posterior lateral effusion on TTE 08/19/22 is improved now being trivial.  3. 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 FINDINGS  Additional Comments: Limited echo for pericardial effuson the previously seen moderate posterior lateral effusion on TTE 08/19/22 is improved now being trivial. 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 EF appears to be 45-50% with abnormal septal motion ? from LBBB.  Jenkins Rouge MD Electronically signed by Jenkins Rouge MD Signature Date/Time: 08/26/2022/3:09:59 PM    Final    DG Chest Portable 1 View  Result Date: 08/26/2022 CLINICAL DATA:  Chest pain EXAM: PORTABLE CHEST 1 VIEW COMPARISON:  08/19/2022 FINDINGS: Stable surgical changes with prosthetic aortic valve. The heart is normal in size. The mediastinal and hilar contours are within normal limits and unchanged. Small residual left pleural effusion with overlying basilar atelectasis. IMPRESSION: Small residual left pleural effusion with overlying basilar atelectasis. Electronically Signed   By: Marijo Sanes M.D.   On: 08/26/2022 12:20   ECHOCARDIOGRAM LIMITED  Result Date: 08/19/2022    ECHOCARDIOGRAM LIMITED REPORT   Patient Name:   Craig Alvarez Date of Exam: 08/19/2022 Medical Rec #:  [id number]    Height:       72.0 in Accession #:    [id number]   Weight:  227.7 lb Date of Birth:  05-16-1961     BSA:          2.251 m Patient Age:    53 years     BP:           116/85 mmHg Patient Gender: M            HR:           93 bpm. Exam Location:  Inpatient Procedure: Limited Echo Indications:    I31.3 Pericardial effusion  History:        Patient has prior history of Echocardiogram examinations, most                 recent 08/18/2022. Risk  Factors:Hypertension and Dyslipidemia.                 Aortic Valve: 27 mm Edwards Inspiris Resilia valve is present in                 the aortic position. Procedure Date: 08/04/22.  Sonographer:    Raquel Sarna Senior RDCS Referring Phys: Quenten Raven MALLIPEDDI  Sonographer Comments: Limited to recheck effusion (pericardial drain in place) IMPRESSIONS  1. Limited Echo to assess pericardial effusion post-pericardiocentesis  2. There is no pericardial effusion anterior to the right ventricle. There is a large pericardial effusion adjacent to the lateral wall of left ventricle, improved from 3.6 cm on admission to 2.5 cm now. FINDINGS  Pericardium: There is no pericardial effusion anterior to the right ventricle. There is a large pericardial effusion adjacent to the lateral wall of left ventricle, improved from 3.6 cm on admission to 2.5 cm now. Aortic Valve: Marland Kitchen Vishnu Priya Mallipeddi Electronically signed by Lorelee Cover Mallipeddi Signature Date/Time: 08/19/2022/2:51:39 PM    Final    [place] CHEST (PLEURAL EFFUSION)  Result Date: 08/19/2022 CLINICAL DATA:  Left effusion, assess for thoracentesis EXAM: CHEST ULTRASOUND COMPARISON:  08/19/2022 FINDINGS: Ultrasound performed of the left posterior chest. There is a very small left effusion with left lower lobe collapse/consolidation visualized. There is not enough fluid to warrant therapeutic thoracentesis. Procedure not performed. IMPRESSION: Trace left effusion by ultrasound. Electronically Signed   By: Jerilynn Mages.  Shick M.D.   On: 08/19/2022 10:37   DG Chest Port 1 View  Result Date: 08/19/2022 CLINICAL DATA:  Pleural effusion EXAM: PORTABLE CHEST 1 VIEW COMPARISON:  08/18/2022 FINDINGS: No focal consolidation. No pneumothorax. Probable small left pleural effusion and atelectasis. Stable cardiomegaly. Prior aortic valve replacement. Prior median sternotomy. No acute osseous abnormality. IMPRESSION: 1. Probable small left pleural effusion and atelectasis. Electronically Signed   By:  Kathreen Devoid M.D.   On: 08/19/2022 09:21   ECHOCARDIOGRAM LIMITED  Result Date: 08/18/2022    ECHOCARDIOGRAM LIMITED REPORT   Patient Name:   Craig Alvarez Date of Exam: 08/18/2022 Medical Rec #:  [id number]    Height:       72.0 in Accession #:    [id number]   Weight:       219.0 lb Date of Birth:  07/20/1960     BSA:          2.214 m Patient Age:    77 years     BP:           131/90 mmHg Patient Gender: M            HR:           97 bpm. Exam Location:  Inpatient Procedure: Limited Echo Indications:    Pericardial  effusion  History:        Patient has prior history of Echocardiogram examinations.                 Signs/Symptoms:Shortness of Breath; Risk Factors:Hypertension,                 Dyslipidemia and Former Smoker. Edwards 27 mm Inspiris Resilia                 aortic valve                 replacement. Procedure date 08/04/22.  Sonographer:    Clayton Lefort RDCS (AE) Referring Phys: [place]  1. Limited echo for pericardiocentesis. Initial images show large circumferential pericardial effusion measuring up to 3.5 cm. Final images show significant improvement in size of effusion, localized adjacent to LV lateral wall measuring 2.2 cm FINDINGS  Pericardium: A large pericardial effusion is present. Oswaldo Milian MD Electronically signed by Oswaldo Milian MD Signature Date/Time: 08/18/2022/6:26:51 PM    Final    CARDIAC CATHETERIZATION  Result Date: 08/18/2022 Successful pericardiocentesis with removal of 900 cc bloody pericardial fluid Plan: Transfer 2H, monitor drain output, leave to suction   ECHOCARDIOGRAM COMPLETE  Result Date: 08/18/2022    ECHOCARDIOGRAM REPORT   Patient Name:   Craig Alvarez Date of Exam: 08/18/2022 Medical Rec #:  [id number]    Height:       72.0 in Accession #:    [id number]   Weight:       219.0 lb Date of Birth:  1961-06-17     BSA:          2.214 m Patient Age:    10 years     BP:           126/77 mmHg Patient Gender: M            HR:           94 bpm.  Exam Location:  Inpatient Procedure: 2D Echo, Cardiac Doppler and Color Doppler           REPORT CONTAINS CRITICAL RESULT  Results discussed with Dr Cyndia Bent at 16:15 on 08/18/22. Indications:    Pericardial effusion  History:        Patient has prior history of Echocardiogram examinations, most                 recent 08/04/2022. Aortic Valve Disease, Signs/Symptoms:Shortness                 of Breath; Risk Factors:Hypertension, Dyslipidemia and Former                 Smoker. Edwards 27 mm Inspiris Resilia aortic valve replacement.                 Procedure date 08/04/22.  Sonographer:    Clayton Lefort RDCS (AE) Referring Phys: [id number] Gaye Pollack  Sonographer Comments: Technically difficult study due to poor echo windows. IMPRESSIONS  1. Large pericardial effusion, measures up to 3.5 cm. No RV collapse seen, but does have fixed/dilated IVC and significant mitral inflow respiratory variation  2. Left ventricular ejection fraction, by estimation, is 50 to 55%. The left ventricle has low normal function. The left ventricle has no regional wall motion abnormalities. There is severe left ventricular hypertrophy. Left ventricular diastolic parameters were normal.  3. Right ventricular systolic function is mildly reduced. The right ventricular size is normal. Tricuspid regurgitation signal is inadequate for assessing  PA pressure.  4. Left atrial size was mildly dilated.  5. The mitral valve is normal in structure. No evidence of mitral valve regurgitation.  6. There is a 27 mm Edwards Inspiris Resilia valve present in the aortic position     Aortic valve regurgitation is not visualized. Echo findings are consistent with normal structure and function of the aortic valve prosthesis. Aortic valve mean gradient measures 3.0 mmHg.  7. The inferior vena cava is dilated in size with <50% respiratory variability, suggesting right atrial pressure of 15 mmHg. FINDINGS  Left Ventricle: Left ventricular ejection fraction, by estimation, is 50  to 55%. The left ventricle has low normal function. The left ventricle has no regional wall motion abnormalities. The left ventricular internal cavity size was normal in size. There is severe left ventricular hypertrophy. Left ventricular diastolic parameters were normal. Right Ventricle: The right ventricular size is normal. No increase in right ventricular wall thickness. Right ventricular systolic function is mildly reduced. Tricuspid regurgitation signal is inadequate for assessing PA pressure. Left Atrium: Left atrial size was mildly dilated. Right Atrium: Right atrial size was normal in size. Pericardium: A large pericardial effusion is present. Mitral Valve: The mitral valve is normal in structure. No evidence of mitral valve regurgitation. Tricuspid Valve: The tricuspid valve is normal in structure. Tricuspid valve regurgitation is trivial. Aortic Valve: The aortic valve has been repaired/replaced. Aortic valve regurgitation is not visualized. Aortic valve mean gradient measures 3.0 mmHg. Aortic valve peak gradient measures 5.6 mmHg. Aortic valve area, by VTI measures 3.98 cm. There is a 27 mm Edwards Inspiris Resilia valve present in the aortic position. Echo findings are consistent with normal structure and function of the aortic valve prosthesis. Pulmonic Valve: The pulmonic valve was not well visualized. Pulmonic valve regurgitation is not visualized. Aorta: The aortic root is normal in size and structure. Venous: The inferior vena cava is dilated in size with less than 50% respiratory variability, suggesting right atrial pressure of 15 mmHg. IAS/Shunts: The interatrial septum was not well visualized.  LEFT VENTRICLE PLAX 2D LVIDd:         4.60 cm   Diastology LVIDs:         3.40 cm   LV e' medial:    9.25 cm/s LV PW:         1.70 cm   LV E/e' medial:  7.2 LV IVS:        1.70 cm   LV e' lateral:   10.10 cm/s LVOT diam:     2.60 cm   LV E/e' lateral: 6.6 LV SV:         73 LV SV Index:   33 LVOT Area:      5.31 cm  RIGHT VENTRICLE            IVC RV Basal diam:  3.30 cm    IVC diam: 2.30 cm RV S prime:     6.64 cm/s LEFT ATRIUM             Index        RIGHT ATRIUM           Index LA diam:        3.00 cm 1.35 cm/m   RA Area:     15.70 cm LA Vol (A2C):   46.6 ml 21.04 ml/m  RA Volume:   40.40 ml  18.24 ml/m LA Vol (A4C):   83.1 ml 37.53 ml/m LA Biplane Vol: 62.8 ml 28.36 ml/m  AORTIC  VALVE AV Area (Vmax):    3.62 cm AV Area (Vmean):   3.42 cm AV Area (VTI):     3.98 cm AV Vmax:           118.00 cm/s AV Vmean:          83.700 cm/s AV VTI:            0.184 m AV Peak Grad:      5.6 mmHg AV Mean Grad:      3.0 mmHg LVOT Vmax:         80.40 cm/s LVOT Vmean:        53.900 cm/s LVOT VTI:          0.138 m LVOT/AV VTI ratio: 0.75  AORTA Ao Root diam: 3.80 cm MITRAL VALVE MV Area (PHT): 5.09 cm    SHUNTS MV Decel Time: 149 msec    Systemic VTI:  0.14 m MV E velocity: 66.90 cm/s  Systemic Diam: 2.60 cm MV A velocity: 82.50 cm/s MV E/A ratio:  0.81 Oswaldo Milian MD Electronically signed by Oswaldo Milian MD Signature Date/Time: 08/18/2022/4:26:07 PM    Final    DG Chest Port 1 View  Result Date: 08/18/2022 CLINICAL DATA:  Shortness of breath EXAM: PORTABLE CHEST 1 VIEW COMPARISON:  08/09/2022 FINDINGS: Cardiomegaly and aortic valve replacement again noted. New LEFT LOWER lung opacity noted possible LEFT effusion. There is no evidence of pneumothorax. The RIGHT lung is clear. No acute bony abnormalities are present. Remote rib fractures are present. IMPRESSION: 1. New LEFT LOWER lung opacity which may represent atelectasis, consolidation and/or effusion. 2. Cardiomegaly. Electronically Signed   By: Margarette Canada M.D.   On: 08/18/2022 14:33   DG Chest Port 1 View  Result Date: 08/09/2022 CLINICAL DATA:  Shortness of breath EXAM: PORTABLE CHEST 1 VIEW COMPARISON:  08/06/2022 FINDINGS: Cardiomegaly status post median sternotomy with aortic valve prosthesis. Both lungs are clear. The visualized skeletal  structures are unremarkable. IMPRESSION: Cardiomegaly without acute abnormality of the lungs. Electronically Signed   By: Delanna Ahmadi M.D.   On: 08/09/2022 13:36   DG Chest Port 1 View  Result Date: 08/06/2022 CLINICAL DATA:  Evaluate for pneumothorax. EXAM: PORTABLE CHEST 1 VIEW COMPARISON:  Chest x-ray August 05, 2022 FINDINGS: The PA catheter has been removed. A right IJ sheath remains. No pneumothorax. Probable atelectasis in the left base, stable. The heart, hila, mediastinum, lungs, and pleura are otherwise unchanged. IMPRESSION: The PA catheter has been removed. A right IJ sheath remains. No pneumothorax. Electronically Signed   By: Dorise Bullion III M.D.   On: 08/06/2022 09:11   DG Chest Port 1 View  Result Date: 08/05/2022 CLINICAL DATA:  Postop from aortic valve replacement. EXAM: PORTABLE CHEST 1 VIEW COMPARISON:  08/04/2022 FINDINGS: Endotracheal tube and nasogastric tube have been removed. Swan-Ganz catheter and mediastinal drain remain in place. No evidence of pneumothorax. Decreased interstitial prominence consistent with a resolving interstitial edema. Subsegmental atelectasis is seen in the left lower lobe, but shows improvement since previous study. IMPRESSION: Resolving interstitial edema and left lower lobe atelectasis. No pneumothorax visualized. Electronically Signed   By: Marlaine Hind M.D.   On: 08/05/2022 11:05   ECHO INTRAOPERATIVE TEE  Result Date: 08/04/2022  *INTRAOPERATIVE TRANSESOPHAGEAL REPORT *  Patient Name:   Craig Alvarez Date of Exam: 08/04/2022 Medical Rec #:  [id number]    Height:       72.0 in Accession #:    [id number]   Weight:  235.0 lb Date of Birth:  [birth year]     BSA:          2.28 m Patient Age:    52 years     BP:           120/69 mmHg Patient Gender: M            HR:           68 bpm. Exam Location:  Inpatient Transesophogeal exam was perform intraoperatively during surgical procedure. Patient was closely monitored under general anesthesia during the  entirety of examination. Indications:     aortic valve replacement Performing Phys: [id number] BRYAN K BARTLE Diagnosing Phys: Belenda Cruise Stoltzfus Complications: No known complications during this procedure. POST-OP IMPRESSIONS _ Left Ventricle: The left ventricle is unchanged from pre-bypass. _ Right Ventricle: The right ventricle appears unchanged from pre-bypass. _ Aorta: The aorta appears unchanged from pre-bypass. _ Aortic Valve: A bioprosthetic valve was placed, leaflets are freely mobile Size; 75m. No regurgitation post replacement. The gradient recorded across the prosthetic valve is within the expected range. No perivalvular leak noted.mean PG 652mg. _ Mitral Valve: There is mild regurgitation. _ Tricuspid Valve: The tricuspid valve appears unchanged from pre-bypass. _ Pulmonic Valve: The pulmonic valve appears unchanged from pre-bypass. _ Interatrial Septum: The interatrial septum appears unchanged from pre-bypass. _ Pericardium: The pericardium appears unchanged from pre-bypass. PRE-OP FINDINGS  Left Ventricle: The left ventricle has normal systolic function, with an ejection fraction of 55-60%. The cavity size was normal. No evidence of left ventricular regional wall motion abnormalities. There is moderate concentric left ventricular hypertrophy. Left ventricular diastolic parameters were normal. Right Ventricle: The right ventricle has normal systolic function. The cavity was normal. There is no increase in right ventricular wall thickness. Right ventricular systolic pressure is normal. Left Atrium: Left atrial size was normal in size. No left atrial/left atrial appendage thrombus was detected. Left atrial appendage velocity is normal at greater than 40 cm/s. Right Atrium: Right atrial size was normal in size. Prominent Eustachian valve. Interatrial Septum: No atrial level shunt detected by color flow Doppler. The interatrial septum appears to be lipomatous. There is no evidence of a patent foramen ovale.  Pericardium: There is no evidence of pericardial effusion. There is no pleural effusion. Mitral Valve: The mitral valve is normal in structure. Mitral valve regurgitation mild to moderate. The MR jet is centrally-directed. There is no evidence of mitral valve vegetation. Pulmonary venous flow is blunted (decreased). There is mild mitral valve regurgitation by PISA measuring 2.26. There is No evidence of mitral stenosis. A2/P2 poor coaptation. PISA radius .6cm, EROA .12cm2, RV 32cc. Tricuspid Valve: The tricuspid valve was normal in structure. Tricuspid valve regurgitation is mild by color flow Doppler. No evidence of tricuspid stenosis is present. There is no evidence of tricuspid valve vegetation. 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 The jet is posteriorly-directed. 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 There is no evidence of aortic valve vegetation. There is moderate thickening and severe calcifcation present on the aortic valve left coronary and right coronary cusps with severely decreased mobility. Pulmonic Valve: The pulmonic valve was normal in structure, with normal. No evidence of pumonic stenosis. Pulmonic valve regurgitation is trivial by color flow Doppler. Aorta: The aortic root and ascending aorta are normal in size and structure. Pulmonary Artery: Sw[place]ordy Councilmanatheter present on the right. The pulmonary artery is of normal size.  Shunts: There is a small secundum atrial septal defect with predominantly left to right shunting across the atrial septum. +--------------+--------++ LEFT VENTRICLE          +----------------+---------++ +--------------+--------++  Diastology                PLAX 2D                 +----------------+---------++ +--------------+--------++  LV e' lateral:  7.82 cm/s LV PW:        1.40 cm   +----------------+---------++ +--------------+--------++  LV E/e'  lateral:10.1      LV IVS:       1.70 cm   +----------------+---------++ +--------------+--------++  LV e' medial:   7.20 cm/s LVOT diam:    2.70 cm   +----------------+---------++ +--------------+--------++  LV E/e' medial: 11.0      LVOT Area:    5.73 cm  +----------------+---------++ +--------------+--------++                         +----------------------+-------++ +--------------+--------++  2D Longitudinal Strain                                    +----------------------+-------++                             2D Strain GLS (A2C):  -13.9 %                             +----------------------+-------++                             2D Strain GLS (A3C):  -11.1 %                             +----------------------+-------++                             2D Strain GLS (A4C):  -9.9 %                              +----------------------+-------++                             2D Strain GLS Avg:    -11.6 %                             +----------------------+-------++                              +------------+---------++                             3D Volume EF                                      +------------+---------++  LV 3D EDV:  126.46 ml                             +------------+---------++                             LV 3D ESV:  54.22 ml                              +------------+---------++ +---------------+------+-------+ RIGHT VENTRICLE              +---------------+------+-------+ TAPSE (M-mode):1.6 cm2.37 cm +---------------+------+-------+ +------------------+------------++ AORTIC VALVE                   +------------------+------------++ AV Area (Vmax):   1.28 cm     +------------------+------------++ AV Area (Vmean):  1.29 cm     +------------------+------------++ AV Area (VTI):    1.48 cm     +------------------+------------++ AV Vmax:          390.00 cm/s  +------------------+------------++  AV Vmean:         267.500 cm/s +------------------+------------++ AV VTI:           1.075 m      +------------------+------------++ AV Peak Grad:     60.8 mmHg    +------------------+------------++ AV Mean Grad:     33.5 mmHg    +------------------+------------++ LVOT Vmax:        86.90 cm/s   +------------------+------------++ LVOT Vmean:       60.500 cm/s  +------------------+------------++ LVOT VTI:         0.278 m      +------------------+------------++ LVOT/AV VTI ratio:0.26         +------------------+------------++ AR PHT:           485 msec     +------------------+------------++  +--------------+-------++ AORTA                 +--------------+-------++ Ao Sinus diam:4.10 cm +--------------+-------++ Ao STJ diam:  2.9 cm  +--------------+-------++ Ao Asc diam:  3.70 cm +--------------+-------++ +--------------+----------++ MITRAL VALVE                 +--------------+-------+ +--------------+----------++     SHUNTS                MV Area (PHT):3.33 cm       +--------------+-------+ +--------------+----------++     Systemic VTI: 0.28 m  MV Peak grad: 3.1 mmHg       +--------------+-------+ +--------------+----------++     Systemic Diam:2.70 cm MV Mean grad: 1.0 mmHg       +--------------+-------+ +--------------+----------++ MV Vmax:      0.87 m/s   +--------------+----------++ MV Vmean:     51.0 cm/s  +--------------+----------++ MV VTI:       0.21 m     +--------------+----------++ MV PHT:       66.12 msec +--------------+----------++ MV Decel Time:228 msec   +--------------+----------++ +----------------+-----------++ MR Peak grad:   206.2 mmHg  +----------------+-----------++ MR Mean grad:   127.0 mmHg  +----------------+-----------++ MR Vmax:        718.00 cm/s +----------------+-----------++ MR Vmean:       523.0 cm/s  +----------------+-----------++ MR PISA:        2.26 cm     +----------------+-----------++ MR PISA Eff ROA:12 mm      +----------------+-----------++ MR PISA Radius: 0.60 cm     +----------------+-----------++ +--------------+----------++  MV E velocity:79.00 cm/s +--------------+----------++ MV A velocity:85.10 cm/s +--------------+----------++ MV E/A ratio: 0.93       +--------------+----------++  Rochele Pages Electronically signed by Rochele Pages Signature Date/Time: 08/04/2022/8:49:58 PM    Final    DG Chest 2 View  Result Date: 08/04/2022 CLINICAL DATA:  [id number] Pre-op chest exam [id number] EXAM: CHEST - 2 VIEW COMPARISON:  April 25, 2020 FINDINGS: The cardiomediastinal silhouette is unchanged in contour. No pleural effusion. No pneumothorax. No acute pleuroparenchymal abnormality. Visualized abdomen is unremarkable. Multilevel degenerative changes of the thoracic spine. Remote LEFT-sided rib fractures. IMPRESSION: No acute cardiopulmonary abnormality. Electronically Signed   By: Valentino Saxon M.D.   On: 08/04/2022 17:32   DG Chest Port 1 View  Result Date: 08/04/2022 CLINICAL DATA:  Status post aortic valve replacement. EXAM: PORTABLE CHEST 1 VIEW COMPARISON:  Chest radiograph 08/02/2022. FINDINGS: Interval postoperative changes of median sternotomy and aortic valve replacement. Right IJ approach pulmonary artery catheter projects over the right main pulmonary artery. Endotracheal tube tip projects over the midthoracic trachea. Enteric tube side port projects over the stomach. Two mediastinal chest tubes in place. Low lung volumes accentuate the pulmonary vasculature and cardiomediastinal silhouette. Superimposed moderate pulmonary edema with small left pleural effusion. Expected postoperative enlargement of the cardiomediastinal silhouette. Old rib fracture deformities of the left posterior chest wall. IMPRESSION: 1. Expected postoperative changes of median sternotomy aortic valve replacement. Moderate pulmonary edema with small  left pleural effusion. 2. Support apparatus in good position, as above. Electronically Signed   By: Emmit Alexanders M.D.   On: 08/04/2022 13:37   EP STUDY  Result Date: 08/04/2022 See surgical note for result.  VAS US DOPPLER PRE CABG  Result Date: 08/02/2022 PREOPERATIVE VASCULAR EVALUATION Patient Name:  Craig Alvarez  Date of Exam:   08/02/2022 Medical Rec #: [id number]     Accession #:    [id number] Date of Birth: Sep 14, 1960      Patient Gender: M Patient Age:   80 years Exam Location:  [facility] Procedure:      VAS US DOPPLER PRE CABG Referring Phys: Gilford Raid --------------------------------------------------------------------------------  Indications:  Aorta Valve Replacement. Risk Factors: Hypertension, hyperlipidemia, coronary artery disease. Performing Technologist: Oda Cogan RDMS, RVT  Examination Guidelines: A complete evaluation includes B-mode imaging, spectral Doppler, color Doppler, and power Doppler as needed of all accessible portions of each vessel. Bilateral testing is considered an integral part of a complete examination. Limited examinations for reoccurring indications may be performed as noted.  Right Carotid Findings: +----------+--------+--------+--------+----------------------+--------+           PSV cm/sEDV cm/sStenosisDescribe              Comments +----------+--------+--------+--------+----------------------+--------+ CCA Prox  88      22                                             +----------+--------+--------+--------+----------------------+--------+ CCA Distal77      27                                             +----------+--------+--------+--------+----------------------+--------+ ICA Prox  118     35      1-39%   irregular and calcific         +----------+--------+--------+--------+----------------------+--------+ ICA Mid  120     40                                              +----------+--------+--------+--------+----------------------+--------+ ICA Distal87      29                                             +----------+--------+--------+--------+----------------------+--------+ ECA       90      18                                             +----------+--------+--------+--------+----------------------+--------+ +----------+--------+-------+----------------+------------+           PSV cm/sEDV cmsDescribe        Arm Pressure +----------+--------+-------+----------------+------------+ Subclavian127            Multiphasic, WNL             +----------+--------+-------+----------------+------------+ +---------+--------+--+--------+--+---------+ VertebralPSV cm/s38EDV cm/s14Antegrade +---------+--------+--+--------+--+---------+ Left Carotid Findings: +----------+--------+--------+--------+--------+--------+           PSV cm/sEDV cm/sStenosisDescribeComments +----------+--------+--------+--------+--------+--------+ CCA Prox  110     26                               +----------+--------+--------+--------+--------+--------+ CCA Distal90      27                               +----------+--------+--------+--------+--------+--------+ ICA Prox  83      35      1-39%                    +----------+--------+--------+--------+--------+--------+ ICA Mid   88      36                               +----------+--------+--------+--------+--------+--------+ ICA Distal70      26                               +----------+--------+--------+--------+--------+--------+ ECA       70      13                               +----------+--------+--------+--------+--------+--------+ +----------+--------+--------+----------------+------------+ SubclavianPSV cm/sEDV cm/sDescribe        Arm Pressure +----------+--------+--------+----------------+------------+           89              Multiphasic, WNL              +----------+--------+--------+----------------+------------+ +---------+--------+--+--------+--+---------+ VertebralPSV cm/s24EDV cm/s11Antegrade +---------+--------+--+--------+--+---------+  ABI Findings: +--------+------------------+-----+---------+--------+ Right   Rt Pressure (mmHg)IndexWaveform Comment  +--------+------------------+-----+---------+--------+ Brachial120                    triphasic         +--------+------------------+-----+---------+--------+ +--------+------------------+-----+---------+-------+ Left    Lt Pressure (mmHg)IndexWaveform Comment +--------+------------------+-----+---------+-------+ [id number]  triphasic        +--------+------------------+-----+---------+-------+  Right Doppler Findings: +--------+--------+-----+---------+--------+ Site    PressureIndexDoppler  Comments +--------+--------+-----+---------+--------+ [id number]          triphasic         +--------+--------+-----+---------+--------+ Radial               biphasic          +--------+--------+-----+---------+--------+ Ulnar                triphasic         +--------+--------+-----+---------+--------+  Left Doppler Findings: +--------+--------+-----+---------+--------+ Site    PressureIndexDoppler  Comments +--------+--------+-----+---------+--------+ [id number]          triphasic         +--------+--------+-----+---------+--------+ Radial               triphasic         +--------+--------+-----+---------+--------+ Ulnar                triphasic         +--------+--------+-----+---------+--------+   Summary: Right Carotid: Velocities in the right ICA are consistent with a 1-39% stenosis.                Velocities suggest upper end of scale. Left Carotid: Velocities in the left ICA are consistent with a 1-39% stenosis. Vertebrals:  Bilateral vertebral arteries demonstrate antegrade flow. Subclavians: Normal flow  hemodynamics were seen in bilateral subclavian              arteries. Right Upper Extremity: Doppler waveforms remain within normal limits with right radial compression. Doppler waveforms decrease <50% with right ulnar compression. Left Upper Extremity: Doppler waveforms remain within normal limits with left radial compression. Doppler waveforms remain within normal limits with left ulnar compression.  Electronically signed by Harold Barban MD on 08/02/2022 at 8:57:41 PM.    Final    (Echo, Carotid, EGD, Colonoscopy, ERCP)    Subjective: Patient seen in the morning rounds.  Occasional anterior chest wall pain but denies any other complaints.  On room air.  Ambulated around on room air.   Discharge Exam: Vitals:   08/28/22 1210 08/28/22 1516  BP: 114/80 110/63  Pulse: 95 92  Resp: 18 20  Temp: 97.8 F (36.6 C) 98 F (36.7 C)  SpO2: 96% 98%   Vitals:   08/28/22 0605 08/28/22 0831 08/28/22 1210 08/28/22 1516  BP: 102/84 117/82 114/80 110/63  Pulse: 87 96 95 92  Resp: 19 20 18 20  $ Temp:  98 F (36.7 C) 97.8 F (36.6 C) 98 F (36.7 C)  TempSrc:  Oral Oral Oral  SpO2: 95% 99% 96% 98%  Weight:      Height:        General: Pt is alert, awake, not in acute distress Cardiovascular: RRR, S1/S2 +, no rubs, no gallops, he has midline sternotomy wound looks clean and dry. Respiratory: CTA bilaterally, no wheezing, no rhonchi Abdominal: Soft, NT, ND, bowel sounds + Extremities: no edema, no cyanosis    The results of significant diagnostics from this hospitalization (including imaging, microbiology, ancillary and laboratory) are listed below for reference.     Microbiology: Recent Results (from the past 240 hour(s))  Culture, body fluid w Gram Stain-bottle     Status: None   Collection Time: 08/18/22  5:34 PM   Specimen: Pericardial  Result Value Ref Range Status   Specimen Description PERICARDIAL  Final   Special Requests NONE  Final   Culture   Final  NO GROWTH 5  DAYS Performed at [facility], [place] [address], [place], [place] [address]    Report Status 08/23/2022 FINAL  Final  Gram stain     Status: None   Collection Time: 08/18/22  5:34 PM   Specimen: Pericardial  Result Value Ref Range Status   Specimen Description PERICARDIAL  Final   Special Requests NONE  Final   Gram Stain   Final    NO WBC SEEN NO ORGANISMS SEEN Performed at [facility], [address], [place], [place] [address]    Report Status 08/18/2022 FINAL  Final  MRSA Next Gen by PCR, Nasal     Status: None   Collection Time: 08/18/22  6:19 PM   Specimen: Nasal Mucosa; Nasal Swab  Result Value Ref Range Status   MRSA by PCR Next Gen NOT DETECTED NOT DETECTED Final    Comment: (NOTE) The GeneXpert MRSA Assay (FDA approved for NASAL specimens only), is one component of a comprehensive MRSA colonization surveillance program. It is not intended to diagnose MRSA infection nor to guide or monitor treatment for MRSA infections. Test performance is not FDA approved in patients less than 78 years old. Performed at [facility], [place] [address] [place], [place] [address]      Labs: BNP (last 3 results) Recent Labs    08/18/22 1421 08/26/22 1206  BNP 234.2* 0000000   Basic Metabolic Panel: Recent Labs  Lab 08/26/22 1206 08/27/22 0110  NA 131* 132*  K 4.2 3.6  CL 98 99  CO2 23 26  GLUCOSE 106* 131*  BUN 26* 19  CREATININE 1.01 0.91  CALCIUM 9.6 8.9  MG 2.2  --    Liver Function Tests: Recent Labs  Lab 08/26/22 1206  AST 26  ALT 42  ALKPHOS 96  BILITOT 0.8  PROT 6.6  ALBUMIN 3.7   Recent Labs  Lab 08/26/22 1206  LIPASE 40   No results for input(s): "AMMONIA" in the last 168 hours. CBC: Recent Labs  Lab 08/26/22 1206 08/27/22 0110 08/28/22 0114  WBC 7.9 7.1 5.4  NEUTROABS 5.4  --   --   HGB 12.3* 11.6* 12.1*  HCT 38.1* 35.5* 35.4*  MCV 93.8 92.9 90.5  PLT 538* 464* 274   Cardiac Enzymes: No results for input(s): "CKTOTAL",  "CKMB", "CKMBINDEX", "TROPONINI" in the last 168 hours. BNP: Invalid input(s): "POCBNP" CBG: Recent Labs  Lab 08/26/22 1223  GLUCAP 108*   D-Dimer No results for input(s): "DDIMER" in the last 72 hours. Hgb A1c No results for input(s): "HGBA1C" in the last 72 hours. Lipid Profile No results for input(s): "CHOL", "HDL", "LDLCALC", "TRIG", "CHOLHDL", "LDLDIRECT" in the last 72 hours. Thyroid function studies No results for input(s): "TSH", "T4TOTAL", "T3FREE", "THYROIDAB" in the last 72 hours.  Invalid input(s): "FREET3" Anemia work up No results for input(s): "VITAMINB12", "FOLATE", "FERRITIN", "TIBC", "IRON", "RETICCTPCT" in the last 72 hours. Urinalysis    Component Value Date/Time   COLORURINE STRAW (A) 08/02/2022 1130   APPEARANCEUR CLEAR 08/02/2022 1130   LABSPEC 1.006 08/02/2022 1130   PHURINE 5.0 08/02/2022 1130   GLUCOSEU NEGATIVE 08/02/2022 1130   HGBUR NEGATIVE 08/02/2022 1130   BILIRUBINUR NEGATIVE 08/02/2022 1130   KETONESUR NEGATIVE 08/02/2022 1130   PROTEINUR NEGATIVE 08/02/2022 1130   NITRITE NEGATIVE 08/02/2022 1130   LEUKOCYTESUR NEGATIVE 08/02/2022 1130   Sepsis Labs Recent Labs  Lab 08/26/22 1206 08/27/22 0110 08/28/22 0114  WBC 7.9 7.1 5.4   Microbiology Recent  Results (from the past 240 hour(s))  Culture, body fluid w Gram Stain-bottle     Status: None   Collection Time: 08/18/22  5:34 PM   Specimen: Pericardial  Result Value Ref Range Status   Specimen Description PERICARDIAL  Final   Special Requests NONE  Final   Culture   Final    NO GROWTH 5 DAYS Performed at [facility], [address] [place], [place] [address]    Report Status 08/23/2022 FINAL  Final  Gram stain     Status: None   Collection Time: 08/18/22  5:34 PM   Specimen: Pericardial  Result Value Ref Range Status   Specimen Description PERICARDIAL  Final   Special Requests NONE  Final   Gram Stain   Final    NO WBC SEEN NO ORGANISMS SEEN Performed at [facility], [address] [place], [place] [address]    Report Status 08/18/2022 FINAL  Final  MRSA Next Gen by PCR, Nasal     Status: None   Collection Time: 08/18/22  6:19 PM   Specimen: Nasal Mucosa; Nasal Swab  Result Value Ref Range Status   MRSA by PCR Next Gen NOT DETECTED NOT DETECTED Final    Comment: (NOTE) The GeneXpert MRSA Assay (FDA approved for NASAL specimens only), is one component of a comprehensive MRSA colonization surveillance program. It is not intended to diagnose MRSA infection nor to guide or monitor treatment for MRSA infections. Test performance is not FDA approved in patients less than 16 years old. Performed at [facility], [place] [address], [place], [place] [address]      Time coordinating discharge:  32 minutes  SIGNED:   Barb Merino, MD  [facility] 08/28/2022, 4:25 PM

## 2022-08-29 NOTE — Progress Notes (Signed)
Cardiology Clinic Note   Patient Name: Craig Alvarez Date of Encounter: 09/04/2022  Primary Care Provider:  Raina Mina., MD Primary Cardiologist:  Shirlee More, MD  Patient Profile    Craig Alvarez is a 62 y.o. male with a past medical history of aortic valve stenosis s/p AVR January 2024, CAD per angiography, pericardial effusion s/p pericardiocentesis February 2024 on colchicine, PE February 2024 on anticoagulation, hypertension, hyperlipidemia who presents to the clinic today for hospital follow-up.  Past Medical History    Past Medical History:  Diagnosis Date   Acute respiratory disease due to COVID-19 virus 04/26/2020   Anxiety 01/21/2019   Bicuspid aortic valve    Chronic bilateral low back pain without sciatica 12/12/2017   Depression    Dyslipidemia 12/21/2015   ED (erectile dysfunction) 11/04/2015   Essential hypertension 01/21/2019   Hypercholesteremia    Insomnia 01/21/2019   Mixed hyperlipidemia 01/21/2019   Nonrheumatic aortic valve stenosis 12/21/2015   Prediabetes 11/04/2015   Restless legs syndrome (RLS) 01/21/2019   Tear of meniscus of knee 12/27/2018   Past Surgical History:  Procedure Laterality Date   AORTIC VALVE REPLACEMENT N/A 08/04/2022   Procedure: AORTIC VALVE REPLACEMENT (AVR) USING EDWARDS 27 MM INSPIRIS RESILIA AORTIC VALVE;  Surgeon: Gaye Pollack, MD;  Location: [facility];  Service: Open Heart Surgery;  Laterality: N/A;  Median sternotomy   CARDIAC CATHETERIZATION     KNEE ARTHROSCOPY WITH MENISCAL REPAIR Right 2020   PERICARDIOCENTESIS N/A 08/18/2022   Procedure: PERICARDIOCENTESIS;  Surgeon: Sherren Mocha, MD;  Location: [facility];  Service: Cardiovascular;  Laterality: N/A;   RIGHT HEART CATH AND CORONARY ANGIOGRAPHY N/A 07/13/2022   Procedure: RIGHT HEART CATH AND CORONARY ANGIOGRAPHY;  Surgeon: Early Osmond, MD;  Location: [facility];  Service: Cardiovascular;  Laterality: N/A;   TEE WITHOUT CARDIOVERSION N/A  08/04/2022   Procedure: TRANSESOPHAGEAL ECHOCARDIOGRAM (TEE);  Surgeon: Gaye Pollack, MD;  Location: [facility];  Service: Open Heart Surgery;  Laterality: N/A;    Allergies  No Known Allergies  History of Present Illness    Craig Alvarez has a past medical history of: CAD. R/LHC 07/13/2022: Mid LM 20%.  Fick cardiac output 8.5 L/min, cardiac index 3.7.  Mean RA pressure 9 mmHg, mean wedge pressure 15 mmHg, mean PA pressure 23 mmHg.  CTS evaluation. Aortic valve stenosis. Echo 04/28/2022: EF 50 to 55%.  Moderate LVH.  Grade I DD.  Moderate LAE.  Mild MR.  Large LVOT diameter 25 mm, gradient indicative of severe AAS.  Bicuspid aortic valve with mild calcification/moderate thickening.  Mild AR.  Moderate to severe aortic stenosis.  Mild dilatation of ascending aorta 39 mm. AVR 08/04/2022: Edwards 27 mm Inspiris Resilia aortic valve. Pericardial effusion. Echo 08/18/2022: Large pericardial effusion and 3.5 cm.  No RV collapse but fixed/dilated IVC and significant mitral inflow respiratory variation.  EF 50 to 55%.  Severe LVH.  Mildly reduced RV function.  Mild LAE.  Aortic valve present in aortic position with normal structure and function of aortic valve prosthesis.  RA pressure 15 mmHg. S/p pericardiocentesis 08/18/2022: Successful removal of 900 cc of bloody pericardial fluid. Echo 08/28/2022: EF 60 to 65%.  Moderate concentric LVH.  Grade I DD.  Trivial pericardial effusion. PE. CTA chest (PE protocol) 08/26/2022: Small volume thrombopulmonary emboli within the right upper lobe and right middle lobe.  No right ventricular strain.  Overall clot burden is minimal.  No pulmonary infarction.  Groundglass density in the right  middle lobe is favored inflammatory.  Fracture of the posterior medial right first rib, favor subacute fracture recommend clinical correlation. Hypertension. Hyperlipidemia. Lipid panel 05/26/2022: LDL 99, HDL 39, TG 252, total 181.  Craig Alvarez was first evaluated by Dr. Bettina Gavia on  01/21/2019 for preoperative cardiovascular exam prior to knee surgery.  At that time he had known bicuspid aortic valve with mild stenosis from an echo performed at [facility] in 2017.  Patient was not seen again until 04/21/2022 at the request of Dr. Bea Graff, PCP.  At that time he reported 3 months prior he noticed weight gain and breathlessness with physical activity that resolved with the loss of 15 pounds.  Echo showed bicuspid aortic valve with moderate to severe stenosis and gradient indicative of severe AS (detailed above).  Due to economic concerns patient had to delay [facility] (as detailed above) until after the first of the year.  Patient was discharged on 08/10/2022.  His amlodipine, chlorthalidone, and furosemide was stopped.  Patient was seen in the office by Dr. Bettina Gavia on 08/18/2022 with complaints of dyspnea on exertion.  He was found to have diminished breath sounds on the left and tubular breath sounds on the right posteriorly.  There was concern for PE versus severe atelectasis so patient was transported via EMS to the emergency room.  Echo showed large pericardial effusion measuring 3.5 cm and normal structure and function of aortic valve prosthesis (further details above).  Patient underwent successful pericardiocentesis with follow-up limited echos showing improvement in size of effusion with measurement of 2.5 cm.  Patient was discharged on 08/22/2022 and placed on colchicine 0.6 mg twice daily x 3 months and a prednisone taper.  Craig Alvarez returned to the emergency room on 08/26/2022 with complaints of right-sided chest pressure and associated nausea, lightheadedness, fatigue and shortness of breath.  Echo showed EF 45 to 50% with abnormal septal motion question if coming from LBBB, moderate posterior lateral effusion noted from 08/19/2022 was trivial, bioprosthetic valve in place.  CTA chest showed small volume thrombopulmonary emboli in the right upper and middle lobe (details above).  Patient was started  on heparin for 48 hours.  Limited echo was repeated prior to discharge and detailed above.  He was discharged on 08/28/2022 with p.o. Eliquis to be taken for 3 months.  Today, patient reports he is doing well. He has advanced his activity and is now cooking and cleaning along with other light activities around his home. Patient denies shortness of breath or dyspnea on exertion. No chest pain, pressure, or tightness. Denies lower extremity edema, orthopnea, or PND. His only complaint is a fast heart rate up to 125 bpm but usually in the low 100s. He states he feels that it is going fast but it never feels irregular. He reports he is not taking lopressor, as he thinks he was told not to take it but he cannot remember when that was. His BP is low today 90/60 at intake and 92/62 at recheck. He denies dizziness, lightheadedness, presyncope, or syncope. His BP at home is typically in the 120/80 range.    Home Medications    Current Meds  Medication Sig   ALPRAZolam (XANAX) 1 MG tablet Take 1 mg by mouth at bedtime as needed for sleep.   amoxicillin (AMOXIL) 500 MG tablet Take 2,000 mg by mouth See admin instructions. Take 2000 mg by mouth1 hour before dental procedures   APIXABAN (ELIQUIS) VTE STARTER PACK ('10MG'$  AND '5MG'$ ) Take as directed on package: start with  two-'5mg'$  tablets twice daily for 7 days. On day 8, switch to one-'5mg'$  tablet twice daily.   aspirin 81 MG chewable tablet Chew 81 mg by mouth in the morning.   atorvastatin (LIPITOR) 20 MG tablet Take 20 mg by mouth every evening.   CALCIUM-VITAMIN D PO Take 1 tablet by mouth daily.   colchicine 0.6 MG tablet Take 1 tablet (0.6 mg total) by mouth daily.   ezetimibe (ZETIA) 10 MG tablet Take 1 tablet (10 mg total) by mouth daily.   lisinopril (ZESTRIL) 20 MG tablet Take 20 mg by mouth in the morning.   loratadine (CLARITIN) 10 MG tablet Take 10 mg by mouth daily as needed for allergies.   metoprolol tartrate (LOPRESSOR) 50 MG tablet Take 1 tablet (50  mg total) by mouth 2 (two) times daily.   Multiple Vitamin (MULTIVITAMIN) tablet Take 1 tablet by mouth daily.   Omega-3 1000 MG CAPS Take 1,000 mg by mouth in the morning.   ondansetron (ZOFRAN) 4 MG tablet Take 1 tablet (4 mg total) by mouth every 6 (six) hours as needed for nausea.   OVER THE COUNTER MEDICATION Take 2 capsules by mouth at bedtime. Medication:Relaxium   rOPINIRole (REQUIP) 0.5 MG tablet Take 0.5 mg by mouth at bedtime.   sildenafil (REVATIO) 20 MG tablet Take 60-80 mg by mouth daily as needed (erectile dysfunction).    Family History    Family History  Problem Relation Age of Onset   COPD Mother    Heart attack Father    Diabetes Brother    Cancer Maternal Grandmother    He indicated that the status of his mother is unknown. He indicated that the status of his father is unknown. He indicated that his brother is deceased. He indicated that the status of his maternal grandmother is unknown.   Social History    Social History   Socioeconomic History   Marital status: Single    Spouse name: Not on file   Number of children: Not on file   Years of education: Not on file   Highest education level: Not on file  Occupational History   Not on file  Tobacco Use   Smoking status: Former    Types: Cigarettes    Quit date: 1999    Years since quitting: 25.1   Smokeless tobacco: Never  Vaping Use   Vaping Use: Never used  Substance and Sexual Activity   Alcohol use: Yes    Alcohol/week: 33.0 - 35.0 standard drinks of alcohol    Types: 12 - 14 Glasses of wine, 21 Cans of beer per week   Drug use: Yes    Types: Marijuana    Comment: As of 08/02/22 last marijuana use: 08/01/22   Sexual activity: Not on file  Other Topics Concern   Not on file  Social History Narrative   Not on file   Social Determinants of Health   Financial Resource Strain: Not on file  Food Insecurity: No Food Insecurity (08/18/2022)   Hunger Vital Sign    Worried About Running Out of Food  in the Last Year: Never true    Ran Out of Food in the Last Year: Never true  Transportation Needs: No Transportation Needs (08/18/2022)   PRAPARE - Hydrologist (Medical): No    Lack of Transportation (Non-Medical): No  Physical Activity: Not on file  Stress: Not on file  Social Connections: Not on file  Intimate Partner Violence: Not At Risk (  08/18/2022)   Humiliation, Afraid, Rape, and Kick questionnaire    Fear of Current or Ex-Partner: No    Emotionally Abused: No    Physically Abused: No    Sexually Abused: No     Review of Systems    General:  No chills, fever, night sweats or weight changes.  Cardiovascular:  No chest pain, dyspnea on exertion, edema, orthopnea, palpitations, paroxysmal nocturnal dyspnea. Tachycardia.  Dermatological: No rash, lesions/masses Respiratory: No cough, dyspnea Urologic: No hematuria, dysuria Abdominal:   No nausea, vomiting, diarrhea, bright red blood per rectum, melena, or hematemesis Neurologic:  No visual changes, weakness, changes in mental status. All other systems reviewed and are otherwise negative except as noted above.  Physical Exam    VS:  BP 90/60   Pulse (!) 105   Ht 6' (1.829 m)   Wt 217 lb 9.6 oz (98.7 kg)   SpO2 96%   BMI 29.51 kg/m  , BMI Body mass index is 29.51 kg/m. GEN:  Well nourished, well developed, in no acute distress. HEENT: Normal. Neck: Supple, no JVD, carotid bruits, or masses. Cardiac: Tachycardia, regular rhythm, no murmurs, rubs, or gallops. No clubbing, cyanosis, edema.  Radials/DP/PT 2+ and equal bilaterally.  Respiratory:  Respirations regular and unlabored, clear to auscultation bilaterally. GI: Soft, nontender, nondistended. MS: No deformity or atrophy. Skin: Warm and dry, no rash. Surgical incision midline sternotomy well healed without drainage, edema, erythema or warmth. Drain sites x 2 are scabbed without erythema or warmth.  Neuro: Strength and sensation are  intact. Psych: Normal affect.  Accessory Clinical Findings     Recent Labs: 05/26/2022: NT-Pro BNP 469 08/26/2022: ALT 42; B Natriuretic Peptide 89.4; Magnesium 2.2 08/27/2022: BUN 19; Creatinine, Ser 0.91; Potassium 3.6; Sodium 132 08/28/2022: Hemoglobin 12.1; Platelets 274   Recent Lipid Panel    Component Value Date/Time   CHOL 181 05/26/2022 1444   TRIG 252 (H) 05/26/2022 1444   HDL 39 (L) 05/26/2022 1444   CHOLHDL 4.6 05/26/2022 1444   LDLCALC 99 05/26/2022 1444       ECG not performed in office today. ECG from the hospital 08/26/2022 reviewed: Sinus tachycardia, rate 105 bpm, LVH.     Assessment & Plan   Tachycardia. Patient reports his heart rate has been in the low 100s since he got out of the hospital on 08/18/2022. It has been as high as 125 bpm. He feels the racing but it never feels irregular. He denies dizziness, shortness of breath or fatigue. His energy level is improving and he is advancing his activities around his home. He reports he is not taking metoprolol since last discharge from hospital. Regular rhythm to auscultation today. BP is soft today at 92/62 on my recheck. Will decrease lisinopril to 10 mg and start metoprolol tartrate at 12.5 mg bid. He is instructed to continue monitoring BP and call if he becomes symptomatic of SBP is <90.  Aortic valve stenosis s/p AVR.  Severe aortic stenosis on echo October 2023.  S/p AVR January 2024.  Echo from February 2024 showed normal structure and function of aortic valve prosthesis. Patient denies shortness of breath, DOE, orthopnea or PND. He has been able to advance his activity and is now cooking, washing dishes and doing other light activities around his house.  Pericardial effusion.  Patient was sent to emergency room from Dr. Joya Gaskins office on 08/18/2022 for DOE and found to have a large pericardial effusion.  He underwent successful pericardiocentesis.  Limited echo 08/28/2022 showed trivial  pericardial effusion. Patient  denies shortness of breath, DOE, orthopnea, PND, or chest pain. He is advancing activities at home (See #2). Continue colchicine 0.6 mg daily for total of 3 months. He is scheduled for repeat echo on 09/12/2022.  CAD.  LHC January 2024 showed mid LM 20%.  Patient denies chest pain, pressure, or tightness. Continue aspirin, Lipitor, Zetia, and metoprolol (see #3). Hypertension.  BP today 90/60 at intake and 92/62 at my recheck. Patient denies headaches, dizziness, lightheadedness, presyncope, or syncope. BP at home normally in the 120/80 range.  Continue lisinopril and metoprolol (see #1). He is provided with a BP log to continue monitoring BP.  PE.  Patient presented to the emergency room 08/26/2022 with complaints of right-sided chest pressure and associated shortness of breath.  CTA chest showed small volume PE within the right upper and middle lobe. Patient denies shortness of breath or DOE with advancement of activities around the house. PCP will manage Eliquis.       Disposition: Decrease Lisinopril to 10 mg daily. Start metoprolol tartrate at 12.5 mg twice a day. He will continue to monitor BP at home. Repeat echo scheduled for 09/12/2022. Return to Dr. Bettina Gavia in 3 months or sooner as needed.    Justice Britain. Craig Davee, DNP, NP-C     09/04/2022, 8:55 AM [address] Office [phone] Fax [phone]

## 2022-08-30 ENCOUNTER — Ambulatory Visit: Payer: 59

## 2022-09-04 ENCOUNTER — Encounter: Payer: Self-pay | Admitting: Student

## 2022-09-04 ENCOUNTER — Other Ambulatory Visit: Payer: Self-pay | Admitting: Surgery

## 2022-09-04 ENCOUNTER — Ambulatory Visit: Payer: 59 | Attending: Physician Assistant | Admitting: Student

## 2022-09-04 VITALS — BP 90/60 | HR 105 | Ht 72.0 in | Wt 217.6 lb

## 2022-09-04 DIAGNOSIS — I3139 Other pericardial effusion (noninflammatory): Secondary | ICD-10-CM | POA: Diagnosis not present

## 2022-09-04 DIAGNOSIS — Z952 Presence of prosthetic heart valve: Secondary | ICD-10-CM | POA: Diagnosis not present

## 2022-09-04 DIAGNOSIS — I2699 Other pulmonary embolism without acute cor pulmonale: Secondary | ICD-10-CM | POA: Diagnosis not present

## 2022-09-04 DIAGNOSIS — I2510 Atherosclerotic heart disease of native coronary artery without angina pectoris: Secondary | ICD-10-CM | POA: Diagnosis not present

## 2022-09-04 DIAGNOSIS — I350 Nonrheumatic aortic (valve) stenosis: Secondary | ICD-10-CM

## 2022-09-04 DIAGNOSIS — R000 Tachycardia, unspecified: Secondary | ICD-10-CM

## 2022-09-04 MED ORDER — METOPROLOL TARTRATE 25 MG PO TABS: 12.5000 mg | ORAL_TABLET | Freq: Two times a day (BID) | ORAL | 3 refills | Status: DC

## 2022-09-04 MED ORDER — LISINOPRIL 10 MG PO TABS: 10.0000 mg | ORAL_TABLET | Freq: Every day | ORAL | 3 refills | Status: DC

## 2022-09-04 NOTE — Patient Instructions (Signed)
Medication Instructions:  Your physician has recommended you make the following change in your medication:   REDUCE the Lisinopril to 10 mg taking 1 daily  START Metoprolol 25 taking 1/2 tablet twice a day  *If you need a refill on your cardiac medications before your next appointment, please call your pharmacy*   Lab Work: None ordered  If you have labs (blood work) drawn today and your tests are completely normal, you will receive your results only by: [place] (if you have MyChart) OR A paper copy in the mail If you have any lab test that is abnormal or we need to change your treatment, we will call you to review the results.   Testing/Procedures: Your physician has requested that you have an echocardiogram IN MAY 2024 [place]. Echocardiography is a painless test that uses sound waves to create images of your heart. It provides your doctor with information about the size and shape of your heart and how well your heart's chambers and valves are working. This procedure takes approximately one hour. There are no restrictions for this procedure. Please do NOT wear cologne, perfume, aftershave, or lotions (deodorant is allowed). Please arrive 15 minutes prior to your appointment time.    Follow-Up: At [facility], you and your health needs are our priority.  As part of our continuing mission to provide you with exceptional heart care, we have created designated Provider Care Teams.  These Care Teams include your primary Cardiologist (physician) and Advanced Practice Providers (APPs -  Physician Assistants and Nurse Practitioners) who all work together to provide you with the care you need, when you need it.  We recommend signing up for the patient portal called "MyChart".  Sign up information is provided on this After Visit Summary.  MyChart is used to connect with patients for Virtual Visits (Telemedicine).  Patients are able to view lab/test  results, encounter notes, upcoming appointments, etc.  Non-urgent messages can be sent to your provider as well.   To learn more about what you can do with MyChart, go to [email or web address].    Your next appointment:   3 month(s)  Provider:   Shirlee More, MD or Venia Carbon, NP [facility])    Other Instructions

## 2022-09-06 ENCOUNTER — Ambulatory Visit: Payer: 59 | Admitting: Surgery

## 2022-09-06 ENCOUNTER — Ambulatory Visit
Admission: RE | Admit: 2022-09-06 | Discharge: 2022-09-06 | Disposition: A | Discharge status: Home / Self Care | Payer: 59 | Source: Ambulatory Visit | Attending: Surgery | Admitting: Surgery

## 2022-09-06 DIAGNOSIS — I350 Nonrheumatic aortic (valve) stenosis: Secondary | ICD-10-CM

## 2022-09-07 DIAGNOSIS — F17210 Nicotine dependence, cigarettes, uncomplicated: Secondary | ICD-10-CM

## 2022-09-07 HISTORY — DX: Nicotine dependence, cigarettes, uncomplicated: F17.210

## 2022-09-12 ENCOUNTER — Other Ambulatory Visit: Payer: Self-pay | Admitting: Surgery

## 2022-09-12 ENCOUNTER — Ambulatory Visit: Payer: 59 | Attending: Cardiology

## 2022-09-12 DIAGNOSIS — I3139 Other pericardial effusion (noninflammatory): Secondary | ICD-10-CM

## 2022-09-12 DIAGNOSIS — Z951 Presence of aortocoronary bypass graft: Secondary | ICD-10-CM

## 2022-09-12 LAB — ECHOCARDIOGRAM LIMITED
AR max vel: 3.31 cm2
AV Area VTI: 3.53 cm2
AV Area mean vel: 3.55 cm2
AV Mean grad: 8 mmHg
AV Peak grad: 13 mmHg
Ao pk vel: 1.8 m/s
Area-P 1/2: 3.46 cm2
S' Lateral: 3 cm

## 2022-09-13 ENCOUNTER — Telehealth: Payer: Self-pay | Admitting: Cardiology

## 2022-09-13 ENCOUNTER — Encounter: Payer: Self-pay | Admitting: Surgery

## 2022-09-13 ENCOUNTER — Ambulatory Visit (INDEPENDENT_AMBULATORY_CARE_PROVIDER_SITE_OTHER): Payer: Self-pay | Admitting: Surgery

## 2022-09-13 VITALS — BP 146/94 | HR 92 | Resp 20 | Ht 72.0 in | Wt 220.0 lb

## 2022-09-13 DIAGNOSIS — Z952 Presence of prosthetic heart valve: Secondary | ICD-10-CM

## 2022-09-13 NOTE — Telephone Encounter (Signed)
Pt needs the forms for his short term disability and auto insurance filled out. Patient is requesting a call back in regards to getting these forms completed and picked up. Please advise

## 2022-09-13 NOTE — Progress Notes (Signed)
HPI:  Craig Alvarez returns for follow-up status post aortic valve replacement using a 27 mm Edwards INSPIRIS pericardial valve on 08/04/2022.  He had an initial uncomplicated postoperative course but then was readmitted on 08/18/2022 with shortness of breath and a large circumferential pericardial effusion.  He underwent pericardiocentesis by Dr. Burt Knack on 08/18/2022 removing 900 cc of bloody pericardial fluid.  He felt much better.  The drain stayed in for couple days and was removed and he was discharged home.  He did well until he was admitted on 08/26/2022 with shortness of breath and a CTA of the chest showing a small volume of thromboemboli in the pulmonary arteries of the right upper lobe and right middle lobe.  He was started on heparin and transition to Eliquis.  His shortness of breath resolved and he was discharged on Eliquis.  Since discharge he said that he has been feeling great.  He is walking daily without chest pain or shortness of breath.  He had a follow-up 2D echocardiogram yesterday showing normal left ventricular systolic function.  The aortic valve prosthesis was working well with a mean gradient of 8 mmHg.  The pericardial effusion completely resolved.  Current Outpatient Medications  Medication Sig Dispense Refill   ALPRAZolam (XANAX) 1 MG tablet Take 1 mg by mouth at bedtime as needed for sleep.     amoxicillin (AMOXIL) 500 MG tablet Take 2,000 mg by mouth See admin instructions. Take 2000 mg by mouth1 hour before dental procedures     APIXABAN (ELIQUIS) VTE STARTER PACK ('10MG'$  AND '5MG'$ ) Take as directed on package: start with two-'5mg'$  tablets twice daily for 7 days. On day 8, switch to one-'5mg'$  tablet twice daily. 74 each 0   aspirin 81 MG chewable tablet Chew 81 mg by mouth in the morning.     atorvastatin (LIPITOR) 20 MG tablet Take 20 mg by mouth every evening.     CALCIUM-VITAMIN D PO Take 1 tablet by mouth daily.     colchicine 0.6 MG tablet Take 1 tablet (0.6 mg total) by  mouth daily. 180 tablet 0   ezetimibe (ZETIA) 10 MG tablet Take 1 tablet (10 mg total) by mouth daily. 90 tablet 3   lisinopril (ZESTRIL) 10 MG tablet Take 1 tablet (10 mg total) by mouth daily. 90 tablet 3   loratadine (CLARITIN) 10 MG tablet Take 10 mg by mouth daily as needed for allergies.     metoprolol tartrate (LOPRESSOR) 25 MG tablet Take 0.5 tablets (12.5 mg total) by mouth 2 (two) times daily. 90 tablet 3   Multiple Vitamin (MULTIVITAMIN) tablet Take 1 tablet by mouth daily.     Omega-3 1000 MG CAPS Take 1,000 mg by mouth in the morning.     ondansetron (ZOFRAN) 4 MG tablet Take 1 tablet (4 mg total) by mouth every 6 (six) hours as needed for nausea. 20 tablet 0   OVER THE COUNTER MEDICATION Take 2 capsules by mouth at bedtime. Medication:Relaxium     rOPINIRole (REQUIP) 0.5 MG tablet Take 0.5 mg by mouth at bedtime.     sildenafil (REVATIO) 20 MG tablet Take 60-80 mg by mouth daily as needed (erectile dysfunction).     No current facility-administered medications for this visit.     Physical Exam: BP (!) 146/94 (BP Location: Right Arm, Patient Position: Sitting)   Pulse 92   Resp 20   Ht 6' (1.829 m)   Wt 220 lb (99.8 kg)   SpO2 94% Comment: RA  BMI 29.84 kg/m  He looks well. Cardiac exam shows a regular rate and rhythm with normal heart sounds.  There is no murmur. Lungs clear. The chest incision is healing well and the sternum is stable. There is no peripheral edema.  Diagnostic Tests:  None today  Impression:  He is doing well 6 weeks following his surgery.  He has already returned to driving.  I asked him not to do any heavy lifting of more than 10 pounds for 3 months postoperatively.  He has a strenuous job doing lifting and I do not think he could return to that for 3 months postoperatively.  He is going to remain on Eliquis for 3 months.  Plan:  He will continue to follow-up with Dr. Bea Graff and Dr. Bettina Gavia and will return to see me if he has any problems with  his incision.   Craig Pollack, MD [facility] [phone]

## 2022-09-13 NOTE — Telephone Encounter (Signed)
Pt states that he had forms faxed to the [place] off. Apologized that I was calling from [facility] office and I am unable to help him. Will forward to Dr. Bettina Gavia.

## 2022-09-14 NOTE — Telephone Encounter (Signed)
We have the forms for Dr. Bettina Gavia to complete.

## 2022-09-15 NOTE — Telephone Encounter (Signed)
Pt. Called inquiring about forms that needed to be filled out.  I located the forms and called the patient back telling him I would fax the one to [facility].  He stated that there was two forms to be filled out.  After finding the second form from [employer] I called patient back and advised that he needed to come by the office and sign the Authorization to release records. He stated he understood and would be by before the end of business day.

## 2022-09-15 NOTE — Telephone Encounter (Signed)
Pt is calling back stating that Craig Alvarez needed some information on the insurance claim forms, and that he has that information for her.

## 2022-09-18 NOTE — Telephone Encounter (Signed)
Pt. Came by and signed the authorization.  He did not provider the claim number or policy number.  Pt. Was told that the forms had been faxed to [facility] 3 times with only 21 pages showing successful.  He stated he would call them and find out what was going on.

## 2022-09-25 ENCOUNTER — Telehealth: Payer: Self-pay | Admitting: Cardiology

## 2022-09-25 NOTE — Telephone Encounter (Signed)
STAT if HR is under 50 or over 120 (normal HR is 60-100 beats per minute)  What is your heart rate?   91  Do you have a log of your heart rate readings (document readings)?  Yes  Do you have any other symptoms?    SOB on extra exertion  BP -  137/81 (this morning) 136/86 (yesterday) 112/71 (last night)  Patient stated his HR has been high and the bottom number of his BP has also been high.  Patient states is HR yesterday was in the 100's.  Patient states when he lays on his back to go to sleep it feels like he has SOB.

## 2022-09-26 ENCOUNTER — Telehealth: Payer: Self-pay | Admitting: Cardiology

## 2022-09-26 NOTE — Telephone Encounter (Signed)
137/81 HR 91 yesterday, HR is usually 120.  Spoke with pt who states that his BP and heart rate have been elevated the past few days. Medications were reduced on 09/04/22 due to lower BP. Advised to increase his Metoprolol from 12.5 mg to 25 mg twice daily and keep Lisinopril at 10 mg as prescribed. Pt will keep a log of BP and heart rate 2 hours after his medications and call on Friday with readings. Pt verbalized understanding and had no additional questions.

## 2022-09-26 NOTE — Telephone Encounter (Signed)
Patient states he will need his insurance forms to state unable to return to work until at least June.

## 2022-09-26 NOTE — Telephone Encounter (Signed)
Patient dropped off disability forms for car payment- to be completed by Dr. Bettina Gavia. Original forms scanned to chart/ ROI signed/ placed in Dr. Joya Gaskins box on 09/26/22. Patient will pay at pick up if Dr. Bettina Gavia will complete forms.

## 2022-09-27 NOTE — Telephone Encounter (Signed)
Called the patient and informed him that Dr. Bettina Gavia was out of the office until Monday, but when he returns I will make him aware that the insurance forms need to state that he is unable to return to work until at least June. Patient was agreeable with this plan and had no further questions at this time.

## 2022-10-19 DIAGNOSIS — Z86711 Personal history of pulmonary embolism: Secondary | ICD-10-CM

## 2022-10-19 HISTORY — DX: Personal history of pulmonary embolism: Z86.711

## 2022-10-30 ENCOUNTER — Telehealth: Payer: Self-pay | Admitting: Cardiology

## 2022-10-30 DIAGNOSIS — Z0279 Encounter for issue of other medical certificate: Secondary | ICD-10-CM

## 2022-10-30 NOTE — Telephone Encounter (Signed)
Patient came by office to pick up forms- paid $29 cash. FORMS sheet sent to billing.

## 2022-10-30 NOTE — Telephone Encounter (Signed)
Pt c/o medication issue:  1. Name of Medication:   metoprolol tartrate (LOPRESSOR) 25 MG tablet   2. How are you currently taking this medication (dosage and times per day)?   As prescribed  3. Are you having a reaction (difficulty breathing--STAT)?   No  4. What is your medication issue?   Patient stated the dosage for this medication was increased to 1 tablet, twice daily and he will need a new prescription sent to [id number] - [place], [place] - [address]   Patient stated he was recently prescribed Furosemide (1 tablet in the morning every day) and he wants to know if he is supposed to be taking this medication.

## 2022-10-31 ENCOUNTER — Emergency Department (HOSPITAL_COMMUNITY)
Admission: EM | Admit: 2022-10-31 | Discharge: 2022-11-01 | Disposition: A | Discharge status: Home / Self Care | Payer: 59 | Attending: Emergency Medicine | Admitting: Emergency Medicine

## 2022-10-31 ENCOUNTER — Other Ambulatory Visit: Payer: Self-pay

## 2022-10-31 ENCOUNTER — Emergency Department (HOSPITAL_COMMUNITY): Payer: 59

## 2022-10-31 ENCOUNTER — Telehealth: Payer: Self-pay

## 2022-10-31 DIAGNOSIS — R079 Chest pain, unspecified: Secondary | ICD-10-CM | POA: Diagnosis present

## 2022-10-31 DIAGNOSIS — Z7901 Long term (current) use of anticoagulants: Secondary | ICD-10-CM | POA: Insufficient documentation

## 2022-10-31 DIAGNOSIS — R0602 Shortness of breath: Secondary | ICD-10-CM | POA: Insufficient documentation

## 2022-10-31 DIAGNOSIS — R072 Precordial pain: Secondary | ICD-10-CM

## 2022-10-31 LAB — CBC
HCT: 44.2 % (ref 39.0–52.0)
Hemoglobin: 14.2 g/dL (ref 13.0–17.0)
MCH: 29.6 pg (ref 26.0–34.0)
MCHC: 32.1 g/dL (ref 30.0–36.0)
MCV: 92.3 fL (ref 80.0–100.0)
Platelets: 278 10*3/uL (ref 150–400)
RBC: 4.79 MIL/uL (ref 4.22–5.81)
RDW: 14.6 % (ref 11.5–15.5)
WBC: 6.3 10*3/uL (ref 4.0–10.5)
nRBC: 0 % (ref 0.0–0.2)

## 2022-10-31 LAB — BASIC METABOLIC PANEL
Anion gap: 9 (ref 5–15)
BUN: 24 mg/dL — ABNORMAL HIGH (ref 8–23)
CO2: 22 mmol/L (ref 22–32)
Calcium: 9.8 mg/dL (ref 8.9–10.3)
Chloride: 110 mmol/L (ref 98–111)
Creatinine, Ser: 0.92 mg/dL (ref 0.61–1.24)
GFR, Estimated: >60 mL/min (ref >60)
Glucose, Bld: 97 mg/dL (ref 70–99)
Potassium: 4.1 mmol/L (ref 3.5–5.1)
Sodium: 141 mmol/L (ref 135–145)

## 2022-10-31 LAB — TROPONIN I (HIGH SENSITIVITY)
Troponin I (High Sensitivity): 9 ng/L (ref <18)
Troponin I (High Sensitivity): 10 ng/L (ref <18)

## 2022-10-31 NOTE — Telephone Encounter (Signed)
Pt c/o of Chest Pain: STAT if CP now or developed within 24 hours  1. Are you having CP right now? No   2. Are you experiencing any other symptoms (ex. SOB, nausea, vomiting, sweating)? SOB with exertion   3. How long have you been experiencing CP? 2 weeks   4. Is your CP continuous or coming and going? Coming and going   5. Have you taken Nitroglycerin? No  ?   Was having chest tightness earlier today as well as yesterday. Patient denied any symptoms at time of call.

## 2022-10-31 NOTE — ED Triage Notes (Signed)
Pt with hx of valve replacement in January this year here for eval of intermittent chest pain and DOE x 1 week, worse since yesterday. Also reports some confusion about what blood pressure medication he's supposed to be taking and would like some clarification.

## 2022-10-31 NOTE — ED Provider Triage Note (Signed)
Emergency Medicine Provider Triage Evaluation Note  Craig Alvarez , a 62 y.o. male  was evaluated in triage.  Pt complains of midsternal CP intermittent since last week, worse today. With [facility], worse with exertion. Aortic valve replaced earlier this year. Hx HTN.  Review of Systems  Positive: CP, SHOB Negative: Lower ext edema  Physical Exam  BP (!) 164/116 (BP Location: Right Arm)   Pulse (!) 101   Temp 98.3 F (36.8 C)   Resp (!) 22   SpO2 96%  Gen:   Awake, appears uncomfortable  Resp:  Normal effort  MSK:   Moves extremities without difficulty  Other:    Medical Decision Making  Medically screening exam initiated at 5:38 PM.  Appropriate orders placed.  Craig Alvarez was informed that the remainder of the evaluation will be completed by another provider, this initial triage assessment does not replace that evaluation, and the importance of remaining in the ED until their evaluation is complete.     Jeannie Fend, PA-C 10/31/22 [phone]

## 2022-10-31 NOTE — Telephone Encounter (Signed)
Called patient and he reported that he had been having chest discomfort that comes and goes as well as shortness of breath. He also stated that he had been having chest tightness twice today with the latest episode around lunch. His blood pressure was 157/99 HR 93. Spoke with Dr. Dulce Sellar regarding the patient symptoms and he recommended that the patient go to the ED to be evaluated. Informed the patient of Dr. Hulen Shouts recommendation and he was agreeable with this plan and had no further questions at this time.

## 2022-10-31 NOTE — Telephone Encounter (Signed)
Called patient and he reported that he had been having chest discomfort that comes and goes as well as shortness of breath. He also stated that he had been having chest tightness twice today with the latest episode around lunch. His blood pressure was 157/99 HR 93. Spoke with Dr. Munley regarding the patient symptoms and he recommended that the patient go to the ED to be evaluated. Informed the patient of Dr. Munley's recommendation and he was agreeable with this plan and had no further questions at this time.   

## 2022-11-01 ENCOUNTER — Emergency Department (HOSPITAL_COMMUNITY): Payer: 59

## 2022-11-01 ENCOUNTER — Telehealth: Payer: Self-pay | Admitting: Cardiology

## 2022-11-01 MED ORDER — IOHEXOL 350 MG/ML SOLN
75.0000 mL | Freq: Once | INTRAVENOUS | Status: AC | PRN
  Administered 2022-11-01: 75 mL via INTRAVENOUS

## 2022-11-01 MED ORDER — METOPROLOL TARTRATE 25 MG PO TABS: 25.0000 mg | ORAL_TABLET | Freq: Two times a day (BID) | ORAL | 3 refills | Status: DC

## 2022-11-01 NOTE — Telephone Encounter (Signed)
Patient called and stated that chlorthalidone and amlodipine medications were discontinued when he was in the hospital in February. Yesterday when he was discharged from the ER they were on his medication list. He would like clarification as to whether he should be taking these medications. Please advise.

## 2022-11-01 NOTE — Telephone Encounter (Signed)
Pt states he is concerned about his BP and wants to know if he should be taking chlorthalidone and amlopidine that was discontinued in hospital in [place]. Please advise.

## 2022-11-01 NOTE — Telephone Encounter (Signed)
*  STAT* If patient is at the pharmacy, call can be transferred to refill team.   1. Which medications need to be refilled? (please list name of each medication and dose if known)   metoprolol tartrate (LOPRESSOR) 25 MG tablet   2. Which pharmacy/location (including street and city if local pharmacy) is medication to be sent to?  [id number] - [place], [place] - [address]   3. Do they need a 30 day or 90 day supply?   90 day  Patient stated he is completely out of this medication.

## 2022-11-01 NOTE — ED Provider Notes (Signed)
[place] EMERGENCY DEPARTMENT AT [facility] Provider Note   CSN: [id number] Arrival date & time: 10/31/22  1731     History  Chief Complaint  Patient presents with   Chest Pain   Shortness of Breath    Craig Alvarez is a 62 y.o. male.  The history is provided by the patient.  Chest Pain Pain location:  R chest and L chest Pain quality: dull   Pain radiates to:  Does not radiate Pain severity:  Moderate Onset quality:  Gradual Duration:  1 week Timing:  Constant Progression:  Unchanged Chronicity:  Recurrent Context: at rest   Relieved by:  Nothing Worsened by:  Nothing Ineffective treatments:  None tried Associated symptoms: shortness of breath   Associated symptoms: no fever, no nausea, no palpitations and no vomiting   Risk factors: male sex   Patient with h/o PE with one week of chest pain and SOB.       Home Medications Prior to Admission medications   Medication Sig Start Date End Date Taking? Authorizing Provider  ALPRAZolam Prudy Feeler) 1 MG tablet Take 1 mg by mouth at bedtime as needed for sleep. 04/06/22   [provider]  amoxicillin (AMOXIL) 500 MG tablet Take 2,000 mg by mouth See admin instructions. Take 2000 mg by mouth1 hour before dental procedures    [provider]  APIXABAN (ELIQUIS) VTE STARTER PACK (  AND ) Take as directed on package: start with two-5mg  tablets twice daily for 7 days. On day 8, switch to one-5mg  tablet twice daily. 08/28/22   Dorcas Carrow, MD  aspirin 81 MG chewable tablet Chew 81 mg by mouth in the morning.    [provider]  atorvastatin (LIPITOR) 20 MG tablet Take 20 mg by mouth every evening. 12/20/18   [provider]  CALCIUM-VITAMIN D PO Take 1 tablet by mouth daily.    [provider]  colchicine 0.6 MG tablet Take 1 tablet (0.6 mg total) by mouth daily. 08/28/22   Dorcas Carrow, MD  ezetimibe (ZETIA) 10 MG tablet Take 1 tablet (10 mg total) by mouth daily.  06/06/22   Baldo Daub, MD  lisinopril (ZESTRIL) 10 MG tablet Take 1 tablet (10 mg total) by mouth daily. 09/04/22   Carlos Levering, NP  loratadine (CLARITIN) 10 MG tablet Take 10 mg by mouth daily as needed for allergies.    [provider]  metoprolol tartrate (LOPRESSOR) 25 MG tablet Take 0.5 tablets (12.5 mg total) by mouth 2 (two) times daily. 09/04/22   Carlos Levering, NP  Multiple Vitamin (MULTIVITAMIN) tablet Take 1 tablet by mouth daily.    [provider]  Omega-3 1000 MG CAPS Take 1,000 mg by mouth in the morning.    [provider]  ondansetron (ZOFRAN) 4 MG tablet Take 1 tablet (4 mg total) by mouth every 6 (six) hours as needed for nausea. 08/28/22   Dorcas Carrow, MD  OVER THE COUNTER MEDICATION Take 2 capsules by mouth at bedtime. Medication:Relaxium    [provider]  rOPINIRole (REQUIP) 0.5 MG tablet Take 0.5 mg by mouth at bedtime.    [provider]  sildenafil (REVATIO) 20 MG tablet Take 60-80 mg by mouth daily as needed (erectile dysfunction).    [provider]      Allergies    Patient has no known allergies.    Review of Systems   Review of Systems  Constitutional:  Negative for fever.  HENT:  Negative for facial  swelling.   Eyes:  Negative for redness.  Respiratory:  Positive for shortness of breath. Negative for wheezing and stridor.   Cardiovascular:  Positive for chest pain. Negative for palpitations and leg swelling.  Gastrointestinal:  Negative for nausea and vomiting.  All other systems reviewed and are negative.   Physical Exam Updated Vital Signs BP (!) 140/105 (BP Location: Left Arm)   Pulse 100   Temp 97.9 F (36.6 C)   Resp 17   SpO2 96%  Physical Exam Constitutional:      General: He is not in acute distress.    Appearance: Normal appearance. He is well-developed. He is not diaphoretic.  HENT:     Head: Normocephalic and atraumatic.     Nose: Nose normal.  Eyes:      Conjunctiva/sclera: Conjunctivae normal.     Pupils: Pupils are equal, round, and reactive to light.  Cardiovascular:     Rate and Rhythm: Normal rate and regular rhythm.     Pulses: Normal pulses.     Heart sounds: Normal heart sounds.  Pulmonary:     Effort: Pulmonary effort is normal.     Breath sounds: Normal breath sounds. No wheezing or rales.  Abdominal:     General: Bowel sounds are normal.     Palpations: Abdomen is soft.     Tenderness: There is no abdominal tenderness. There is no guarding or rebound.  Musculoskeletal:        General: Normal range of motion.     Cervical back: Normal range of motion and neck supple.  Skin:    General: Skin is warm and dry.     Capillary Refill: Capillary refill takes less than 2 seconds.  Neurological:     General: No focal deficit present.     Mental Status: He is alert and oriented to person, place, and time.     Deep Tendon Reflexes: Reflexes normal.  Psychiatric:        Mood and Affect: Mood normal.        Behavior: Behavior normal.     ED Results / Procedures / Treatments   Labs (all labs ordered are listed, but only abnormal results are displayed) Results for orders placed or performed during the hospital encounter of 10/31/22  Basic metabolic panel  Result Value Ref Range   Sodium 141 135 - 145 mmol/L   Potassium 4.1 3.5 - 5.1 mmol/L   Chloride 110 98 - 111 mmol/L   CO2 22 22 - 32 mmol/L   Glucose, Bld 97 70 - 99 mg/dL   BUN 24 (H) 8 - 23 mg/dL   Creatinine, Ser 1.61 0.61 - 1.24 mg/dL   Calcium 9.8 8.9 - 09.6 mg/dL   GFR, Estimated >04 >54 mL/min   Anion gap 9 5 - 15  CBC  Result Value Ref Range   WBC 6.3 4.0 - 10.5 K/uL   RBC 4.79 4.22 - 5.81 MIL/uL   Hemoglobin 14.2 13.0 - 17.0 g/dL   HCT 09.8 11.9 - 14.7 %   MCV 92.3 80.0 - 100.0 fL   MCH 29.6 26.0 - 34.0 pg   MCHC 32.1 30.0 - 36.0 g/dL   RDW 82.9 56.2 - 13.0 %   Platelets 278 150 - 400 K/uL   nRBC 0.0 0.0 - 0.2 %  Troponin I (High Sensitivity)  Result  Value Ref Range   Troponin I (High Sensitivity) 9 <18 ng/L  Troponin I (High Sensitivity)  Result Value Ref Range   Troponin  I (High Sensitivity) 10 <18 ng/L   CT Angio Chest PE W and/or Wo Contrast  Result Date: 11/01/2022 CLINICAL DATA:  Dyspnea, chronic, chest wall or pleura disease suspected EXAM: CT ANGIOGRAPHY CHEST WITH CONTRAST TECHNIQUE: Multidetector CT imaging of the chest was performed using the standard protocol during bolus administration of intravenous contrast. Multiplanar CT image reconstructions and MIPs were obtained to evaluate the vascular anatomy. RADIATION DOSE REDUCTION: This exam was performed according to the departmental dose-optimization program which includes automated exposure control, adjustment of the mA and/or kV according to patient size and/or use of iterative reconstruction technique. CONTRAST:  75mL OMNIPAQUE IOHEXOL 350 MG/ML SOLN COMPARISON:  08/26/2022 FINDINGS: Cardiovascular: Heart is mildly enlarged. Prior aortic valve replacement. No evidence of aortic aneurysm. Coronary artery calcifications, aortic atherosclerosis. No filling defects in the pulmonary arteries to suggest pulmonary emboli. Mediastinum/Nodes: No mediastinal, hilar, or axillary adenopathy. Trachea and esophagus are unremarkable. Thyroid unremarkable. Lungs/Pleura: Stable chronic elevation of the left hemidiaphragm. Left base atelectasis. Right lung clear. No effusions. Upper Abdomen: No acute findings Musculoskeletal: Chest wall soft tissues are unremarkable. No acute bony abnormality. Review of the MIP images confirms the above findings. IMPRESSION: No evidence of pulmonary embolus. Stable chronic elevation of the left hemidiaphragm with left base atelectasis. Aortic Atherosclerosis (ICD10-I70.0). Electronically Signed   By: Charlett Nose M.D.   On: 11/01/2022 00:56   DG Chest 2 View  Result Date: 10/31/2022 CLINICAL DATA:  Chest pain. Aortic valve replacement earlier this year. EXAM: CHEST - 2  VIEW COMPARISON:  Chest radiographs 09/06/2022 FINDINGS: Status post median sternotomy and CABG. Aortic valve prosthesis is again seen. The cardiac silhouette is again mildly to moderately enlarged. Mediastinal contours are within normal limits. Unchanged elevation of left hemidiaphragm. The lungs appear clear. No pleural effusion or pneumothorax. Old healed fractures of the posterior left fifth through eighth ribs, unchanged. IMPRESSION: 1. No active cardiopulmonary disease. 2. Stable cardiomegaly. 3. Unchanged elevation of the left hemidiaphragm. Electronically Signed   By: Neita Garnet M.D.   On: 10/31/2022 18:10     EKG EKG Interpretation  Date/Time:  Tuesday Camren Henthorn 23 2024 17:35:02 EDT Ventricular Rate:  100 PR Interval:  144 QRS Duration: 82 QT Interval:  344 QTC Calculation: 443 R Axis:   76 Text Interpretation: Normal sinus rhythm  ST T changes unchange Confirmed by Nicanor Alcon, [place] Arabie ([id number]) on 10/31/2022 11:07:48 PM  Radiology CT Angio Chest PE W and/or Wo Contrast  Result Date: 11/01/2022 CLINICAL DATA:  Dyspnea, chronic, chest wall or pleura disease suspected EXAM: CT ANGIOGRAPHY CHEST WITH CONTRAST TECHNIQUE: Multidetector CT imaging of the chest was performed using the standard protocol during bolus administration of intravenous contrast. Multiplanar CT image reconstructions and MIPs were obtained to evaluate the vascular anatomy. RADIATION DOSE REDUCTION: This exam was performed according to the departmental dose-optimization program which includes automated exposure control, adjustment of the mA and/or kV according to patient size and/or use of iterative reconstruction technique. CONTRAST:  75mL OMNIPAQUE IOHEXOL 350 MG/ML SOLN COMPARISON:  08/26/2022 FINDINGS: Cardiovascular: Heart is mildly enlarged. Prior aortic valve replacement. No evidence of aortic aneurysm. Coronary artery calcifications, aortic atherosclerosis. No filling defects in the pulmonary arteries to suggest pulmonary  emboli. Mediastinum/Nodes: No mediastinal, hilar, or axillary adenopathy. Trachea and esophagus are unremarkable. Thyroid unremarkable. Lungs/Pleura: Stable chronic elevation of the left hemidiaphragm. Left base atelectasis. Right lung clear. No effusions. Upper Abdomen: No acute findings Musculoskeletal: Chest wall soft tissues are unremarkable. No acute bony abnormality. Review of the MIP images confirms  the above findings. IMPRESSION: No evidence of pulmonary embolus. Stable chronic elevation of the left hemidiaphragm with left base atelectasis. Aortic Atherosclerosis (ICD10-I70.0). Electronically Signed   By: Charlett Nose M.D.   On: 11/01/2022 00:56   DG Chest 2 View  Result Date: 10/31/2022 CLINICAL DATA:  Chest pain. Aortic valve replacement earlier this year. EXAM: CHEST - 2 VIEW COMPARISON:  Chest radiographs 09/06/2022 FINDINGS: Status post median sternotomy and CABG. Aortic valve prosthesis is again seen. The cardiac silhouette is again mildly to moderately enlarged. Mediastinal contours are within normal limits. Unchanged elevation of left hemidiaphragm. The lungs appear clear. No pleural effusion or pneumothorax. Old healed fractures of the posterior left fifth through eighth ribs, unchanged. IMPRESSION: 1. No active cardiopulmonary disease. 2. Stable cardiomegaly. 3. Unchanged elevation of the left hemidiaphragm. Electronically Signed   By: Neita Garnet M.D.   On: 10/31/2022 18:10    Procedures Procedures    Medications Ordered in ED Medications  iohexol (OMNIPAQUE) 350 MG/ML injection 75 mL (75 mLs Intravenous Contrast Given 11/01/22 0050)    ED Course/ Medical Decision Making/ A&P                             Medical Decision Making Patient 1 week of pain at rest   Amount and/or Complexity of Data Reviewed Independent Historian: spouse    Details: See above  Labs: ordered.    Details: All labs reviewed: normal white 6.3, normal hemoglobin 14.2, normal platelets.  Normal  troponins 10 and then 9 Radiology: ordered and independent interpretation performed.    Details: No PE by me on CTA ECG/medicine tests: ordered and independent interpretation performed. Decision-making details documented in ED Course.  Risk Prescription drug management. Risk Details: Ruled out for MI, Ruled out for PE.  No acute pathology.  Symptoms are not exertional.  Stable for discharge.  Follow up with your cardiologist for ongoing care.      Final Clinical Impression(s) / ED Diagnoses Final diagnoses:  None   Return for intractable cough, coughing up blood, fevers > 100.4 unrelieved by medication, shortness of breath, intractable vomiting, chest pain, shortness of breath, weakness, numbness, changes in speech, facial asymmetry, abdominal pain, passing out, Inability to tolerate liquids or food, cough, altered mental status or any concerns. No signs of systemic illness or infection. The patient is nontoxic-appearing on exam and vital signs are within normal limits.  I have reviewed the triage vital signs and the nursing notes. Pertinent labs & imaging results that were available during my care of the patient were reviewed by me and considered in my medical decision making (see chart for details). After history, exam, and medical workup I feel the patient has been appropriately medically screened and is safe for discharge home. Pertinent diagnoses were discussed with the patient. Patient was given return precautions.  Rx / DC Orders ED Discharge Orders     None         Cahlil Sattar, MD 11/01/22 [phone]

## 2022-11-01 NOTE — Telephone Encounter (Signed)
Medication refill and provided new instructions per the following note on file:                     Eleonore Chiquito RN 09/26/22  8:46 AM Note 137/81 HR 91 yesterday, HR is usually 120.   Spoke with pt who states that his BP and heart rate have been elevated the past few days. Medications were reduced on 09/04/22 due to lower BP. Advised to increase his Metoprolol from 12.5 mg to 25 mg twice daily and keep Lisinopril at 10 mg as prescribed. Pt will keep a log of BP and heart rate 2 hours after his medications and call on Friday with readings. Pt verbalized understanding and had no additional questions.       Patient notified

## 2022-11-06 ENCOUNTER — Telehealth: Payer: Self-pay | Admitting: Cardiology

## 2022-11-06 NOTE — Telephone Encounter (Signed)
Patient dropped off disability forms for car payment- to be completed by Dr. Dulce Sellar. Original forms scanned to chart/  placed in Dr. Hulen Shouts box on 11/06/22

## 2022-11-06 NOTE — Telephone Encounter (Signed)
Patient would like a referral sent to [facility] for cardiac rehab please. CB # [phone]

## 2022-11-07 ENCOUNTER — Telehealth: Payer: Self-pay

## 2022-11-07 NOTE — Telephone Encounter (Signed)
Called patient to informed him that he should not be taking chlorthalidone or amlodipine per Dr. Dulce Sellar. Patient did not answer the phone. Left message for the patient to call back.

## 2022-11-08 ENCOUNTER — Telehealth: Payer: Self-pay

## 2022-11-08 NOTE — Telephone Encounter (Signed)
Called patient and informed him that he should not be taking chlorthalidone or amlodipine per Dr. Dulce Sellar. Patient verbalized understanding and stated that he was not taking these medications. Patient had no further questions at this time.

## 2022-11-08 NOTE — Telephone Encounter (Signed)
Pt returning call

## 2022-11-10 NOTE — Telephone Encounter (Signed)
Patient informed of results.  

## 2022-11-29 NOTE — Progress Notes (Unsigned)
Cardiology Office Note:    Date:  11/30/2022   ID:  Craig Alvarez, DOB [birth year], MRN [id number]  PCP:  Gordan Payment., MD  Cardiologist:  Norman Herrlich, MD    Referring MD: Gordan Payment., MD    ASSESSMENT:    1. S/P AVR   2. Pericardial effusion   3. Hypertensive heart disease with chronic diastolic congestive heart failure (HCC)   4. Mixed hyperlipidemia    PLAN:    In order of problems listed above:  He has had a complex course with postoperative hemopericardium requiring pericardiocentesis and small volume pulmonary embolism he can stop colchicine can stop his anticoagulant and can perform normal activities. Stable ambulatory blood pressure is at target no longer on loop diuretic continue his ACE inhibitor beta-blocker Continue combined statin and Zetia   Next appointment: 6 months   Medication Adjustments/Labs and Tests Ordered: Current medicines are reviewed at length with the patient today.  Concerns regarding medicines are outlined above.  No orders of the defined types were placed in this encounter.  No orders of the defined types were placed in this encounter.      History of Present Illness:    Craig Alvarez is a 62 y.o. male with a hx of aortic valve disease and a bicuspid aortic valve and AVR 08/04/2022 and subsequently cardiocentesis postoperatively 08/18/2022 hypertension and hyperlipidemia last seen 05/26/2022.  Most recent echocardiogram 09/12/2022 shows normal left ventricular size, LVH normal EF 60 to 65% the left atrium is mildly to moderately dilated and the surgical aortic valve function normal.  Compliance with diet, lifestyle and medications: Yes  Overall he is doing very well except time at the beginning of April he did very extensive heavy outdoor gardening work and for several days had off-and-on irregular heartbeat I have asked him to purchase the mobile Kardia device that happens again to capture and send his testing MyChart to be  important to know if he is having atrial fibrillation No edema shortness of breath chest pain or syncope  Had a small volume PE and CT of the chest.  08/26/2022 with anticoagulation  Most recent labs 10/19/2022: Hemoglobin 13.8 platelets 208,000 cholesterol 175 LDL 99 triglycerides 228 HDL 133  I think he is fully recovered he is having no chest pain edema shortness of breath palpitation or syncope He is tolerating combined antiplatelet anticoagulant without bleeding more than 3 months remote from his very minor pulmonary embolism can stop his anticoagulant He is more than 3 months remote from pericarditis he can stop his colchicine He tolerates his statin without muscle pain or weakness Home blood pressure consistently in the range of 130/80 Past Medical History:  Diagnosis Date   Acute respiratory disease due to COVID-19 virus 04/26/2020   Anxiety 01/21/2019   Bicuspid aortic valve    Chronic bilateral low back pain without sciatica 12/12/2017   Depression    Dyslipidemia 12/21/2015   ED (erectile dysfunction) 11/04/2015   Essential hypertension 01/21/2019   Hypercholesteremia    Insomnia 01/21/2019   Mixed hyperlipidemia 01/21/2019   Nonrheumatic aortic valve stenosis 12/21/2015   Prediabetes 11/04/2015   Restless legs syndrome (RLS) 01/21/2019   Tear of meniscus of knee 12/27/2018    Past Surgical History:  Procedure Laterality Date   AORTIC VALVE REPLACEMENT N/A 08/04/2022   Procedure: AORTIC VALVE REPLACEMENT (AVR) USING EDWARDS 27 MM INSPIRIS RESILIA AORTIC VALVE;  Surgeon: Alleen Borne, MD;  Location: [facility] OR;  Service: Open Heart Surgery;  Laterality: N/A;  Median sternotomy   CARDIAC CATHETERIZATION     KNEE ARTHROSCOPY WITH MENISCAL REPAIR Right 2020   PERICARDIOCENTESIS N/A 08/18/2022   Procedure: PERICARDIOCENTESIS;  Surgeon: Tonny Bollman, MD;  Location: [facility] INVASIVE CV LAB;  Service: Cardiovascular;  Laterality: N/A;   RIGHT HEART CATH AND CORONARY ANGIOGRAPHY N/A  07/13/2022   Procedure: RIGHT HEART CATH AND CORONARY ANGIOGRAPHY;  Surgeon: Orbie Pyo, MD;  Location: [facility] INVASIVE CV LAB;  Service: Cardiovascular;  Laterality: N/A;   TEE WITHOUT CARDIOVERSION N/A 08/04/2022   Procedure: TRANSESOPHAGEAL ECHOCARDIOGRAM (TEE);  Surgeon: Alleen Borne, MD;  Location: [facility] OR;  Service: Open Heart Surgery;  Laterality: N/A;    Current Medications: No outpatient medications have been marked as taking for the 11/30/22 encounter (Office Visit) with Baldo Daub, MD.     Allergies:   Patient has no known allergies.   Social History   Socioeconomic History   Marital status: Single    Spouse name: Not on file   Number of children: Not on file   Years of education: Not on file   Highest education level: Not on file  Occupational History   Not on file  Tobacco Use   Smoking status: Former    Types: Cigarettes    Quit date: 1999    Years since quitting: 25.4   Smokeless tobacco: Never  Vaping Use   Vaping Use: Never used  Substance and Sexual Activity   Alcohol use: Yes    Alcohol/week: 33.0 - 35.0 standard drinks of alcohol    Types: 12 - 14 Glasses of wine, 21 Cans of beer per week   Drug use: Yes    Types: Marijuana    Comment: As of 08/02/22 last marijuana use: 08/01/22   Sexual activity: Not on file  Other Topics Concern   Not on file  Social History Narrative   Not on file   Social Determinants of Health   Financial Resource Strain: Not on file  Food Insecurity: No Food Insecurity (08/18/2022)   Hunger Vital Sign    Worried About Running Out of Food in the Last Year: Never true    Ran Out of Food in the Last Year: Never true  Transportation Needs: No Transportation Needs (08/18/2022)   PRAPARE - Administrator, Civil Service (Medical): No    Lack of Transportation (Non-Medical): No  Physical Activity: Not on file  Stress: Not on file  Social Connections: Not on file     Family History: The patient's family history  includes COPD in his mother; Cancer in his maternal grandmother; Diabetes in his brother; Heart attack in his father. ROS:   Please see the history of present illness.    All other systems reviewed and are negative.  EKGs/Labs/Other Studies Reviewed:    The following studies were reviewed today:  Cardiac Studies & Procedures   CARDIAC CATHETERIZATION  CARDIAC CATHETERIZATION 08/18/2022  Narrative Successful pericardiocentesis with removal of 900 cc bloody pericardial fluid  Plan: Transfer 2H, monitor drain output, leave to suction   CARDIAC CATHETERIZATION  CARDIAC CATHETERIZATION 07/13/2022  Narrative   Mid LM lesion is 20% stenosed.  1.  Minimal obstructive coronary artery disease. 2.  Fick cardiac output of 8.5 L/min and Fick cardiac index of 3.7 L/min/m with mean RA pressure of 9 mmHg, mean wedge pressure of 15 mmHg, and mean PA pressure of 23 mmHg.  Recommendation: Cardiothoracic surgical evaluation.  Findings Coronary Findings Diagnostic  Dominance: Right  Left Main Mid LM  lesion is 20% stenosed.  Left Anterior Descending The vessel exhibits minimal luminal irregularities.  Right Coronary Artery The vessel exhibits minimal luminal irregularities.  Intervention  No interventions have been documented.     ECHOCARDIOGRAM  ECHOCARDIOGRAM LIMITED 09/12/2022  Narrative ECHOCARDIOGRAM LIMITED REPORT    Patient Name:   Craig Alvarez Date of Exam: 09/12/2022 Medical Rec #:  [id number]    Height:       72.0 in Accession #:    [id number]   Weight:       217.6 lb Date of Birth:  [birth year]     BSA:          2.208 m Patient Age:    61 years     BP:           90/60 mmHg Patient Gender: M            HR:           97 bpm. Exam Location:  [place]  Procedure: Cardiac Doppler, Color Doppler and Limited Echo  Indications:    Pericardial effusion [I31.39 (ICD-10-CM)]  History:        Patient has prior history of Echocardiogram examinations, most recent 08/28/2022. CAD,  Aortic Valve Disease; Risk Factors:Dyslipidemia and Hypertension. Pericardiocentesis 08/18/2022. Aortic Valve: 27 mm Inspiris Resilia valve is present in the aortic position. Procedure Date: 08/04/2022.  Sonographer:    Margreta Journey RDCS Referring Phys: [id number] Adriahna Shearman J Takeysha Bonk  IMPRESSIONS   1. Left ventricular ejection fraction, by estimation, is 60 to 65%. The left ventricle has normal function. The left ventricle has no regional wall motion abnormalities. There is moderate left ventricular hypertrophy. 2. Right ventricular systolic function is normal. The right ventricular size is normal. 3. Left atrial size was mild to moderately dilated. 4. The mitral valve is normal in structure. No evidence of mitral valve regurgitation. No evidence of mitral stenosis. 5. The aortic valve is normal in structure. Aortic valve regurgitation is not visualized. No aortic stenosis is present. There is a 27 mm Inspiris Resilia valve present in the aortic position. Procedure Date: 08/04/2022. Normal function. 6. The inferior vena cava is normal in size with greater than 50% respiratory variability, suggesting right atrial pressure of 3 mmHg.  FINDINGS Left Ventricle: Left ventricular ejection fraction, by estimation, is 60 to 65%. The left ventricle has normal function. The left ventricle has no regional wall motion abnormalities. The left ventricular internal cavity size was normal in size. There is moderate left ventricular hypertrophy.  Right Ventricle: The right ventricular size is normal. No increase in right ventricular wall thickness. Right ventricular systolic function is normal.  Left Atrium: Left atrial size was mild to moderately dilated.  Right Atrium: Right atrial size was normal in size.  Pericardium: There is no evidence of pericardial effusion.  Mitral Valve: The mitral valve is normal in structure. No evidence of mitral valve stenosis.  Tricuspid Valve: The tricuspid valve is normal in  structure. Tricuspid valve regurgitation is not demonstrated. No evidence of tricuspid stenosis.  Aortic Valve: The aortic valve is normal in structure. Aortic valve regurgitation is not visualized. No aortic stenosis is present. Aortic valve mean gradient measures 8.0 mmHg. Aortic valve peak gradient measures 13.0 mmHg. Aortic valve area, by VTI measures 3.53 cm. There is a 27 mm Inspiris Resilia valve present in the aortic position. Procedure Date: 08/04/2022.  Pulmonic Valve: The pulmonic valve was normal in structure. Pulmonic valve regurgitation is not visualized. No evidence of pulmonic stenosis.  Aorta:  The aortic root is normal in size and structure.  Venous: The inferior vena cava is normal in size with greater than 50% respiratory variability, suggesting right atrial pressure of 3 mmHg.  IAS/Shunts: No atrial level shunt detected by color flow Doppler.  LEFT VENTRICLE PLAX 2D LVIDd:         5.20 cm   Diastology LVIDs:         3.00 cm   LV e' medial:    6.37 cm/s LV PW:         1.50 cm   LV E/e' medial:  10.6 LV IVS:        1.90 cm   LV e' lateral:   9.95 cm/s LVOT diam:     2.70 cm   LV E/e' lateral: 6.8 LV SV:         106 LV SV Index:   48 LVOT Area:     5.73 cm   RIGHT VENTRICLE            IVC RV Basal diam:  3.60 cm    IVC diam: 2.00 cm RV Mid diam:    3.10 cm RV S prime:     7.77 cm/s TAPSE (M-mode): 0.9 cm  LEFT ATRIUM             Index        RIGHT ATRIUM           Index LA diam:        4.50 cm 2.04 cm/m   RA Area:     12.30 cm LA Vol (A2C):   40.9 ml 18.52 ml/m  RA Volume:   26.40 ml  11.95 ml/m LA Vol (A4C):   47.7 ml 21.60 ml/m LA Biplane Vol: 46.2 ml 20.92 ml/m AORTIC VALVE AV Area (Vmax):    3.31 cm AV Area (Vmean):   3.55 cm AV Area (VTI):     3.53 cm AV Vmax:           180.00 cm/s AV Vmean:          134.000 cm/s AV VTI:            0.300 m AV Peak Grad:      13.0 mmHg AV Mean Grad:      8.0 mmHg LVOT Vmax:         104.00 cm/s LVOT Vmean:         83.200 cm/s LVOT VTI:          0.185 m LVOT/AV VTI ratio: 0.62  AORTA Ao Root diam: 3.80 cm Ao Asc diam:  3.40 cm Ao Desc diam: 2.10 cm  MITRAL VALVE MV Area (PHT): 3.46 cm    SHUNTS MV Decel Time: 219 msec    Systemic VTI:  0.18 m MV E velocity: 67.40 cm/s  Systemic Diam: 2.70 cm MV A velocity: 89.40 cm/s MV E/A ratio:  0.75  Belva Crome MD Electronically signed by Belva Crome MD Signature Date/Time: 09/12/2022/12:57:51 PM    Final   TEE  ECHO INTRAOPERATIVE TEE 08/04/2022  Narrative *INTRAOPERATIVE TRANSESOPHAGEAL REPORT *    Patient Name:   Craig Alvarez Date of Exam: 08/04/2022 Medical Rec #:  [id number]    Height:       72.0 in Accession #:    [id number]   Weight:       235.0 lb Date of Birth:  [birth year]     BSA:          2.28 m Patient Age:  61 years     BP:           120/69 mmHg Patient Gender: M            HR:           68 bpm. Exam Location:  Inpatient  Transesophogeal exam was perform intraoperatively during surgical procedure. Patient was closely monitored under general anesthesia during the entirety of examination.  Indications:     aortic valve replacement Performing Phys: [id number] BRYAN K BARTLE Diagnosing Phys: Earl Lites Stoltzfus  Complications: No known complications during this procedure. POST-OP IMPRESSIONS _ Left Ventricle: The left ventricle is unchanged from pre-bypass. _ Right Ventricle: The right ventricle appears unchanged from pre-bypass. _ Aorta: The aorta appears unchanged from pre-bypass. _ Aortic Valve: A bioprosthetic valve was placed, leaflets are freely mobile Size; 27mm. No regurgitation post replacement. The gradient recorded across the prosthetic valve is within the expected range. No perivalvular leak noted.mean PG . _ Mitral Valve: There is mild regurgitation. _ Tricuspid Valve: The tricuspid valve appears unchanged from pre-bypass. _ Pulmonic Valve: The pulmonic valve appears unchanged from pre-bypass. _  Interatrial Septum: The interatrial septum appears unchanged from pre-bypass. _ Pericardium: The pericardium appears unchanged from pre-bypass.  PRE-OP FINDINGS Left Ventricle: The left ventricle has normal systolic function, with an ejection fraction of 55-60%. The cavity size was normal. No evidence of left ventricular regional wall motion abnormalities. There is moderate concentric left ventricular hypertrophy. Left ventricular diastolic parameters were normal.   Right Ventricle: The right ventricle has normal systolic function. The cavity was normal. There is no increase in right ventricular wall thickness. Right ventricular systolic pressure is normal.  Left Atrium: Left atrial size was normal in size. No left atrial/left atrial appendage thrombus was detected. Left atrial appendage velocity is normal at greater than 40 cm/s.  Right Atrium: Right atrial size was normal in size. Prominent Eustachian valve.  Interatrial Septum: No atrial level shunt detected by color flow Doppler. The interatrial septum appears to be lipomatous. There is no evidence of a patent foramen ovale.  Pericardium: There is no evidence of pericardial effusion. There is no pleural effusion.  Mitral Valve: The mitral valve is normal in structure. Mitral valve regurgitation mild to moderate. The MR jet is centrally-directed. There is no evidence of mitral valve vegetation. Pulmonary venous flow is blunted (decreased). There is mild mitral valve regurgitation by PISA measuring 2.26. There is No evidence of mitral stenosis. A2/P2 poor coaptation. PISA radius .6cm, EROA .12cm2, RV 32cc.  Tricuspid Valve: The tricuspid valve was normal in structure. Tricuspid valve regurgitation is mild by color flow Doppler. No evidence of tricuspid stenosis is present. There is no evidence of tricuspid valve vegetation.  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 The jet is  posteriorly-directed. 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 There is no evidence of aortic valve vegetation. There is moderate thickening and severe calcifcation present on the aortic valve left coronary and right coronary cusps with severely decreased mobility.  Pulmonic Valve: The pulmonic valve was normal in structure, with normal. No evidence of pumonic stenosis. Pulmonic valve regurgitation is trivial by color flow Doppler.   Aorta: The aortic root and ascending aorta are normal in size and structure.  Pulmonary Artery: Theone Murdoch catheter present on the right. The pulmonary artery is of normal size.  Shunts: There is a small secundum atrial septal defect with predominantly  left to right shunting across the atrial septum.  +--------------+--------++ LEFT VENTRICLE          +----------------+---------++ +--------------+--------++  Diastology                PLAX 2D                 +----------------+---------++ +--------------+--------++  LV e' lateral:  7.82 cm/s LV PW:        1.40 cm   +----------------+---------++ +--------------+--------++  LV E/e' lateral:10.1      LV IVS:       1.70 cm   +----------------+---------++ +--------------+--------++  LV e' medial:   7.20 cm/s LVOT diam:    2.70 cm   +----------------+---------++ +--------------+--------++  LV E/e' medial: 11.0      LVOT Area:    5.73 cm  +----------------+---------++ +--------------+--------++                         +----------------------+-------++ +--------------+--------++  2D Longitudinal Strain        +----------------------+-------++ 2D Strain GLS (A2C):  -13.9 % +----------------------+-------++ 2D Strain GLS (A3C):  -11.1 % +----------------------+-------++ 2D Strain GLS (A4C):  -9.9 %  +----------------------+-------++ 2D Strain GLS Avg:    -11.6  % +----------------------+-------++  +------------+---------++ 3D Volume EF          +------------+---------++ LV 3D EDV:  126.46 ml +------------+---------++ LV 3D ESV:  54.22 ml  +------------+---------++  +---------------+------+-------+ RIGHT VENTRICLE              +---------------+------+-------+ TAPSE (M-mode):1.6 cm2.37 cm +---------------+------+-------+  +------------------+------------++ AORTIC VALVE                   +------------------+------------++ AV Area (Vmax):   1.28 cm     +------------------+------------++ AV Area (Vmean):  1.29 cm     +------------------+------------++ AV Area (VTI):    1.48 cm     +------------------+------------++ AV Vmax:          390.00 cm/s  +------------------+------------++ AV Vmean:         267.500 cm/s +------------------+------------++ AV VTI:           1.075 m      +------------------+------------++ AV Peak Grad:     60.8 mmHg    +------------------+------------++ AV Mean Grad:     33.5 mmHg    +------------------+------------++ LVOT Vmax:        86.90 cm/s   +------------------+------------++ LVOT Vmean:       60.500 cm/s  +------------------+------------++ LVOT VTI:         0.278 m      +------------------+------------++ LVOT/AV VTI ratio:0.26         +------------------+------------++ AR PHT:           485 msec     +------------------+------------++  +--------------+-------++ AORTA                 +--------------+-------++ Ao Sinus diam:4.10 cm +--------------+-------++ Ao STJ diam:  2.9 cm  +--------------+-------++ Ao Asc diam:  3.70 cm +--------------+-------++  +--------------+----------++ MITRAL VALVE                 +--------------+-------+ +--------------+----------++     SHUNTS                MV Area (PHT):3.33 cm       +--------------+-------+ +--------------+----------++     Systemic  VTI: 0.28 m  MV Peak grad: 3.1 mmHg       +--------------+-------+ +--------------+----------++  Systemic Diam:2.70 cm MV Mean grad: 1.0 mmHg       +--------------+-------+ +--------------+----------++ MV Vmax:      0.87 m/s   +--------------+----------++ MV Vmean:     51.0 cm/s  +--------------+----------++ MV VTI:       0.21 m     +--------------+----------++ MV PHT:       66.12 msec +--------------+----------++ MV Decel Time:228 msec   +--------------+----------++ +----------------+-----------++ MR Peak grad:   206.2 mmHg  +----------------+-----------++ MR Mean grad:   127.0 mmHg  +----------------+-----------++ MR Vmax:        718.00 cm/s +----------------+-----------++ MR Vmean:       523.0 cm/s  +----------------+-----------++ MR PISA:        2.26 cm    +----------------+-----------++ MR PISA Eff ROA:12 mm      +----------------+-----------++ MR PISA Radius: 0.60 cm     +----------------+-----------++ +--------------+----------++ MV E velocity:79.00 cm/s +--------------+----------++ MV A velocity:85.10 cm/s +--------------+----------++ MV E/A ratio: 0.93       +--------------+----------++   Hester Mates Electronically signed by Hester Mates Signature Date/Time: 08/04/2022/8:49:58 PM    Final            EKG:  EKG ordered today and personally reviewed.  The ekg ordered today demonstrates sinus rhythm normal no evidence of 1C antiarrhythmic drug toxicity  Recent Labs: 05/26/2022: NT-Pro BNP 469 08/26/2022: ALT 42; B Natriuretic Peptide 89.4; Magnesium 2.2 10/31/2022: BUN 24; Creatinine, Ser 0.92; Hemoglobin 14.2; Platelets 278; Potassium 4.1; Sodium 141  Recent Lipid Panel    Component Value Date/Time   CHOL 181 05/26/2022 1444   TRIG 252 (H) 05/26/2022 1444   HDL 39 (L) 05/26/2022 1444   CHOLHDL 4.6 05/26/2022 1444   LDLCALC 99 05/26/2022 1444    Physical Exam:     VS:  BP (!) 172/90 (BP Location: Left Arm, Patient Position: Sitting)   Pulse 84   Ht 6' (1.829 m)   Wt 231 lb 12.8 oz (105.1 kg)   SpO2 96%   BMI 31.44 kg/m     Wt Readings from Last 3 Encounters:  11/30/22 231 lb 12.8 oz (105.1 kg)  09/13/22 220 lb (99.8 kg)  09/04/22 217 lb 9.6 oz (98.7 kg)     GEN:  Well nourished, well developed in no acute distress HEENT: Normal NECK: No JVD; No carotid bruits LYMPHATICS: No lymphadenopathy CARDIAC: RRR, no murmurs, rubs, gallops RESPIRATORY:  Clear to auscultation without rales, wheezing or rhonchi  ABDOMEN: Soft, non-tender, non-distended MUSCULOSKELETAL:  No edema; No deformity  SKIN: Warm and dry NEUROLOGIC:  Alert and oriented x 3 PSYCHIATRIC:  Normal affect    Signed, Norman Herrlich, MD  11/30/2022 1:26 PM    [facility]

## 2022-11-30 ENCOUNTER — Ambulatory Visit: Payer: 59 | Attending: Cardiology | Admitting: Cardiology

## 2022-11-30 ENCOUNTER — Encounter: Payer: Self-pay | Admitting: Cardiology

## 2022-11-30 VITALS — BP 172/90 | HR 84 | Ht 72.0 in | Wt 231.8 lb

## 2022-11-30 DIAGNOSIS — I3139 Other pericardial effusion (noninflammatory): Secondary | ICD-10-CM

## 2022-11-30 DIAGNOSIS — I110 Hypertensive heart disease with heart failure: Secondary | ICD-10-CM

## 2022-11-30 DIAGNOSIS — Z952 Presence of prosthetic heart valve: Secondary | ICD-10-CM | POA: Diagnosis not present

## 2022-11-30 DIAGNOSIS — E782 Mixed hyperlipidemia: Secondary | ICD-10-CM

## 2022-11-30 DIAGNOSIS — I5032 Chronic diastolic (congestive) heart failure: Secondary | ICD-10-CM

## 2022-11-30 NOTE — Addendum Note (Signed)
Addended by: Roxanne Mins I on: 11/30/2022 01:55 PM   Modules accepted: Orders

## 2022-11-30 NOTE — Patient Instructions (Addendum)
Medication Instructions:  Your physician has recommended you make the following change in your medication:   STOP: Colchicine at the end of May STOP: Eliquis   *If you need a refill on your cardiac medications before your next appointment, please call your pharmacy*   Lab Work: None If you have labs (blood work) drawn today and your tests are completely normal, you will receive your results only by: MyChart Message (if you have MyChart) OR A paper copy in the mail If you have any lab test that is abnormal or we need to change your treatment, we will call you to review the results.   Testing/Procedures: None   Follow-Up: At [facility], you and your health needs are our priority.  As part of our continuing mission to provide you with exceptional heart care, we have created designated Provider Care Teams.  These Care Teams include your primary Cardiologist (physician) and Advanced Practice Providers (APPs -  Physician Assistants and Nurse Practitioners) who all work together to provide you with the care you need, when you need it.  We recommend signing up for the patient portal called "MyChart".  Sign up information is provided on this After Visit Summary.  MyChart is used to connect with patients for Virtual Visits (Telemedicine).  Patients are able to view lab/test results, encounter notes, upcoming appointments, etc.  Non-urgent messages can be sent to your provider as well.   To learn more about what you can do with MyChart, go to [email or web address].    Your next appointment:   6 month(s)  Provider:   Norman Herrlich, MD    Other Instructions None

## 2022-12-19 NOTE — Telephone Encounter (Signed)
Returned call to patient regarding referral to cardiac rehab at [facility]. Patient states that he does not need referral now that he has lost his insurance at this time.

## 2023-01-02 ENCOUNTER — Telehealth: Payer: Self-pay | Admitting: Cardiology

## 2023-01-02 NOTE — Telephone Encounter (Signed)
Patient came by office asking about a letter to return to work. States it is urgent as he is being terminated from his job. Requesting call back asap.

## 2023-01-03 NOTE — Telephone Encounter (Signed)
Called patient and he reported that he did not need the letter to return to work because he is not working at his former job. He has decided to be medically disabled and is filing paperwork with Medicare. Patient had no further questions at this time.

## 2023-01-03 NOTE — Telephone Encounter (Signed)
Pt is calling to f/u on getting a callback regarding the letter he needs for his job to avoid being terminated and he'd like to discuss visits, calls and notes leading up to surgery. He came by the office yesterday and was told he'd get a call but he hasn't heard anything so now he's concerned. Please advise

## 2023-01-05 NOTE — Telephone Encounter (Signed)
New Message:    Please have Craig Alvarez to call patient. He says he  does need a letter to return to work.

## 2023-01-10 ENCOUNTER — Telehealth: Payer: Self-pay | Admitting: Cardiology

## 2023-01-10 NOTE — Telephone Encounter (Signed)
Called patient and he reported that he did need the return to work letter. A return to work letter was printed and signed by Dr. Munley and left at the front desk for the patient to pick - up. Patient was agreeable with this plan and had no further questions at this time.  

## 2023-01-10 NOTE — Telephone Encounter (Signed)
Called patient and he reported that he did need the return to work letter. A return to work letter was printed and signed by Dr. Dulce Sellar and left at the front desk for the patient to pick - up. Patient was agreeable with this plan and had no further questions at this time.

## 2023-01-10 NOTE — Telephone Encounter (Signed)
Left message for the patient to call back.

## 2023-01-10 NOTE — Telephone Encounter (Signed)
Patient states he was returning call. Please advise ?

## 2023-01-25 DIAGNOSIS — E6609 Other obesity due to excess calories: Secondary | ICD-10-CM | POA: Insufficient documentation

## 2023-01-25 DIAGNOSIS — E66811 Obesity, class 1: Secondary | ICD-10-CM

## 2023-01-25 HISTORY — DX: Obesity, class 1: E66.811

## 2023-01-25 HISTORY — DX: Other obesity due to excess calories: E66.09

## 2023-04-30 ENCOUNTER — Ambulatory Visit: Payer: 59 | Admitting: Cardiology

## 2023-04-30 ENCOUNTER — Telehealth: Payer: Self-pay | Admitting: Cardiology

## 2023-04-30 NOTE — Progress Notes (Signed)
 " Cardiology Office Note:  .   Date:  05/01/2023  ID:  Oneil Alvarez, DOB April 27, 1961, MRN [id number] PCP: Thurmond Cathlyn LABOR., MD  [facility] Providers Cardiologist:  Redell Leiter, MD    History of Present Illness: Craig   Pacey Alvarez is a 62 y.o. male with a past medical history of bicuspid aortic valve s/p AVR, hypertension, nonobstructive CAD, history of pulmonary embolism, carotid artery stenosis, dyslipidemia, RLS.  09/12/2022 Limited echo EF 60 to 65%, moderate LVH, LA mild to moderately dilated, no aortic stenosis, Resilia valve present in the aortic position with normal function 08/04/2022 AVR with Resilia valve 08/02/2022 carotid ultrasound mild bilateral carotid artery stenosis 07/13/2022 right left heart cath minimal nonobstructive coronary artery disease  Most recently evaluated by Dr. Leiter on 11/30/2022, he was stable at this time but had a relatively difficult recovery following his AVR.  He was advised to follow-up in 6 months.  He contacted our triage line on 04/30/2023 with complaints of hypertension.  He presents today for follow-up of his hypertension, states that has been steadily increasing recently.  He typically tracks this at home presents a blood pressure log.  It was elevated this weekend as well, he went to the ED yesterday with concerns of hypertension however they knew he was going to be evaluated by cardiology today and he was subsequently discharged home.  He has had a weight gain of looks like approximately 20 pounds over the last 6 months, this is likely contributing to his elevated blood pressure readings. He denies chest pain, palpitations, dyspnea, pnd, orthopnea, n, v, dizziness, syncope, edema, weight gain, or early satiety.   ROS: Review of Systems  All other systems reviewed and are negative.    Studies Reviewed: .        Cardiac Studies & Procedures   CARDIAC CATHETERIZATION  CARDIAC CATHETERIZATION 08/18/2022  Narrative Successful pericardiocentesis  with removal of 900 cc bloody pericardial fluid  Plan: Transfer 2H, monitor drain output, leave to suction   CARDIAC CATHETERIZATION  CARDIAC CATHETERIZATION 07/13/2022  Narrative   Mid LM lesion is 20% stenosed.  1.  Minimal obstructive coronary artery disease. 2.  Fick cardiac output of 8.5 L/min and Fick cardiac index of 3.7 L/min/m with mean RA pressure of 9 mmHg, mean wedge pressure of 15 mmHg, and mean PA pressure of 23 mmHg.  Recommendation: Cardiothoracic surgical evaluation.  Findings Coronary Findings Diagnostic  Dominance: Right  Left Main Mid LM lesion is 20% stenosed.  Left Anterior Descending The vessel exhibits minimal luminal irregularities.  Right Coronary Artery The vessel exhibits minimal luminal irregularities.  Intervention  No interventions have been documented.     ECHOCARDIOGRAM  ECHOCARDIOGRAM LIMITED 09/12/2022  Narrative ECHOCARDIOGRAM LIMITED REPORT    Patient Name:   Craig Alvarez Date of Exam: 09/12/2022 Medical Rec #:  [id number]    Height:       72.0 in Accession #:    [id number]   Weight:       217.6 lb Date of Birth:  Apr 20, 1961     BSA:          2.208 m Patient Age:    61 years     BP:           90/60 mmHg Patient Gender: M            HR:           97 bpm. Exam Location:  [facility]  Procedure: Cardiac Doppler, Color Doppler and Limited Echo  Indications:    Pericardial effusion [I31.39 (ICD-10-CM)]  History:        Patient has prior history of Echocardiogram examinations, most recent 08/28/2022. CAD, Aortic Valve Disease; Risk Factors:Dyslipidemia and Hypertension. Pericardiocentesis 08/18/2022. Aortic Valve: 27 mm Inspiris Resilia valve is present in the aortic position. Procedure Date: 08/04/2022.  Sonographer:    Charlie Jointer RDCS Referring Phys: [id number] BRIAN J MUNLEY  IMPRESSIONS   1. Left ventricular ejection fraction, by estimation, is 60 to 65%. The left ventricle has normal function. The left ventricle has no  regional wall motion abnormalities. There is moderate left ventricular hypertrophy. 2. Right ventricular systolic function is normal. The right ventricular size is normal. 3. Left atrial size was mild to moderately dilated. 4. The mitral valve is normal in structure. No evidence of mitral valve regurgitation. No evidence of mitral stenosis. 5. The aortic valve is normal in structure. Aortic valve regurgitation is not visualized. No aortic stenosis is present. There is a 27 mm Inspiris Resilia valve present in the aortic position. Procedure Date: 08/04/2022. Normal function. 6. The inferior vena cava is normal in size with greater than 50% respiratory variability, suggesting right atrial pressure of 3 mmHg.  FINDINGS Left Ventricle: Left ventricular ejection fraction, by estimation, is 60 to 65%. The left ventricle has normal function. The left ventricle has no regional wall motion abnormalities. The left ventricular internal cavity size was normal in size. There is moderate left ventricular hypertrophy.  Right Ventricle: The right ventricular size is normal. No increase in right ventricular wall thickness. Right ventricular systolic function is normal.  Left Atrium: Left atrial size was mild to moderately dilated.  Right Atrium: Right atrial size was normal in size.  Pericardium: There is no evidence of pericardial effusion.  Mitral Valve: The mitral valve is normal in structure. No evidence of mitral valve stenosis.  Tricuspid Valve: The tricuspid valve is normal in structure. Tricuspid valve regurgitation is not demonstrated. No evidence of tricuspid stenosis.  Aortic Valve: The aortic valve is normal in structure. Aortic valve regurgitation is not visualized. No aortic stenosis is present. Aortic valve mean gradient measures 8.0 mmHg. Aortic valve peak gradient measures 13.0 mmHg. Aortic valve area, by VTI measures 3.53 cm. There is a 27 mm Inspiris Resilia valve present in the aortic  position. Procedure Date: 08/04/2022.  Pulmonic Valve: The pulmonic valve was normal in structure. Pulmonic valve regurgitation is not visualized. No evidence of pulmonic stenosis.  Aorta: The aortic root is normal in size and structure.  Venous: The inferior vena cava is normal in size with greater than 50% respiratory variability, suggesting right atrial pressure of 3 mmHg.  IAS/Shunts: No atrial level shunt detected by color flow Doppler.  LEFT VENTRICLE PLAX 2D LVIDd:         5.20 cm   Diastology LVIDs:         3.00 cm   LV e' medial:    6.37 cm/s LV PW:         1.50 cm   LV E/e' medial:  10.6 LV IVS:        1.90 cm   LV e' lateral:   9.95 cm/s LVOT diam:     2.70 cm   LV E/e' lateral: 6.8 LV SV:         106 LV SV Index:   48 LVOT Area:     5.73 cm   RIGHT VENTRICLE            IVC RV Basal  diam:  3.60 cm    IVC diam: 2.00 cm RV Mid diam:    3.10 cm RV S prime:     7.77 cm/s TAPSE (M-mode): 0.9 cm  LEFT ATRIUM             Index        RIGHT ATRIUM           Index LA diam:        4.50 cm 2.04 cm/m   RA Area:     12.30 cm LA Vol (A2C):   40.9 ml 18.52 ml/m  RA Volume:   26.40 ml  11.95 ml/m LA Vol (A4C):   47.7 ml 21.60 ml/m LA Biplane Vol: 46.2 ml 20.92 ml/m AORTIC VALVE AV Area (Vmax):    3.31 cm AV Area (Vmean):   3.55 cm AV Area (VTI):     3.53 cm AV Vmax:           180.00 cm/s AV Vmean:          134.000 cm/s AV VTI:            0.300 m AV Peak Grad:      13.0 mmHg AV Mean Grad:      8.0 mmHg LVOT Vmax:         104.00 cm/s LVOT Vmean:        83.200 cm/s LVOT VTI:          0.185 m LVOT/AV VTI ratio: 0.62  AORTA Ao Root diam: 3.80 cm Ao Asc diam:  3.40 cm Ao Desc diam: 2.10 cm  MITRAL VALVE MV Area (PHT): 3.46 cm    SHUNTS MV Decel Time: 219 msec    Systemic VTI:  0.18 m MV E velocity: 67.40 cm/s  Systemic Diam: 2.70 cm MV A velocity: 89.40 cm/s MV E/A ratio:  0.75  Baneen Wieseler Crape MD Electronically signed by Marcio Hoque Crape MD Signature  Date/Time: 09/12/2022/12:57:51 PM    Final   TEE  ECHO INTRAOPERATIVE TEE 08/04/2022  Narrative *INTRAOPERATIVE TRANSESOPHAGEAL REPORT *    Patient Name:   PARKS CZAJKOWSKI Date of Exam: 08/04/2022 Medical Rec #:  [id number]    Height:       72.0 in Accession #:    [id number]   Weight:       235.0 lb Date of Birth:  12/15/1960     BSA:          2.28 m Patient Age:    61 years     BP:           120/69 mmHg Patient Gender: M            HR:           68 bpm. Exam Location:  Inpatient  Transesophogeal exam was perform intraoperatively during surgical procedure. Patient was closely monitored under general anesthesia during the entirety of examination.  Indications:     aortic valve replacement Performing Phys: [id number] BRYAN K BARTLE Diagnosing Phys: Cordella Stoltzfus  Complications: No known complications during this procedure. POST-OP IMPRESSIONS _ Left Ventricle: The left ventricle is unchanged from pre-bypass. _ Right Ventricle: The right ventricle appears unchanged from pre-bypass. _ Aorta: The aorta appears unchanged from pre-bypass. _ Aortic Valve: A bioprosthetic valve was placed, leaflets are freely mobile Size; 27mm. No regurgitation post replacement. The gradient recorded across the prosthetic valve is within the expected range. No perivalvular leak noted.mean PG . _ Mitral Valve: There is mild regurgitation. _ Tricuspid Valve: The tricuspid valve  appears unchanged from pre-bypass. _ Pulmonic Valve: The pulmonic valve appears unchanged from pre-bypass. _ Interatrial Septum: The interatrial septum appears unchanged from pre-bypass. _ Pericardium: The pericardium appears unchanged from pre-bypass.  PRE-OP FINDINGS Left Ventricle: The left ventricle has normal systolic function, with an ejection fraction of 55-60%. The cavity size was normal. No evidence of left ventricular regional wall motion abnormalities. There is moderate concentric left ventricular hypertrophy. Left  ventricular diastolic parameters were normal.   Right Ventricle: The right ventricle has normal systolic function. The cavity was normal. There is no increase in right ventricular wall thickness. Right ventricular systolic pressure is normal.  Left Atrium: Left atrial size was normal in size. No left atrial/left atrial appendage thrombus was detected. Left atrial appendage velocity is normal at greater than 40 cm/s.  Right Atrium: Right atrial size was normal in size. Prominent Eustachian valve.  Interatrial Septum: No atrial level shunt detected by color flow Doppler. The interatrial septum appears to be lipomatous. There is no evidence of a patent foramen ovale.  Pericardium: There is no evidence of pericardial effusion. There is no pleural effusion.  Mitral Valve: The mitral valve is normal in structure. Mitral valve regurgitation mild to moderate. The MR jet is centrally-directed. There is no evidence of mitral valve vegetation. Pulmonary venous flow is blunted (decreased). There is mild mitral valve regurgitation by PISA measuring 2.26. There is No evidence of mitral stenosis. A2/P2 poor coaptation. PISA radius .6cm, EROA .12cm2, RV 32cc.  Tricuspid Valve: The tricuspid valve was normal in structure. Tricuspid valve regurgitation is mild by color flow Doppler. No evidence of tricuspid stenosis is present. There is no evidence of tricuspid valve vegetation.  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 The jet is posteriorly-directed. 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 There is no evidence of aortic valve vegetation. There is moderate thickening and severe calcifcation present on the aortic valve left coronary and right coronary cusps with severely decreased mobility.  Pulmonic Valve: The pulmonic valve was normal in structure, with normal. No evidence of pumonic stenosis. Pulmonic valve  regurgitation is trivial by color flow Doppler.   Aorta: The aortic root and ascending aorta are normal in size and structure.  Pulmonary Artery: Norva Purl catheter present on the right. The pulmonary artery is of normal size.  Shunts: There is a small secundum atrial septal defect with predominantly left to right shunting across the atrial septum.  +--------------+--------++ LEFT VENTRICLE          +----------------+---------++ +--------------+--------++  Diastology                PLAX 2D                 +----------------+---------++ +--------------+--------++  LV e' lateral:  7.82 cm/s LV PW:        1.40 cm   +----------------+---------++ +--------------+--------++  LV E/e' lateral:10.1      LV IVS:       1.70 cm   +----------------+---------++ +--------------+--------++  LV e' medial:   7.20 cm/s LVOT diam:    2.70 cm   +----------------+---------++ +--------------+--------++  LV E/e' medial: 11.0      LVOT Area:    5.73 cm  +----------------+---------++ +--------------+--------++                         +----------------------+-------++ +--------------+--------++  2D Longitudinal Strain        +----------------------+-------++  2D Strain GLS (A2C):  -13.9 % +----------------------+-------++ 2D Strain GLS (A3C):  -11.1 % +----------------------+-------++ 2D Strain GLS (A4C):  -9.9 %  +----------------------+-------++ 2D Strain GLS Avg:    -11.6 % +----------------------+-------++  +------------+---------++ 3D Volume EF          +------------+---------++ LV 3D EDV:  126.46 ml +------------+---------++ LV 3D ESV:  54.22 ml  +------------+---------++  +---------------+------+-------+ RIGHT VENTRICLE              +---------------+------+-------+ TAPSE (M-mode):1.6 cm2.37 cm +---------------+------+-------+  +------------------+------------++ AORTIC VALVE                    +------------------+------------++ AV Area (Vmax):   1.28 cm     +------------------+------------++ AV Area (Vmean):  1.29 cm     +------------------+------------++ AV Area (VTI):    1.48 cm     +------------------+------------++ AV Vmax:          390.00 cm/s  +------------------+------------++ AV Vmean:         267.500 cm/s +------------------+------------++ AV VTI:           1.075 m      +------------------+------------++ AV Peak Grad:     60.8 mmHg    +------------------+------------++ AV Mean Grad:     33.5 mmHg    +------------------+------------++ LVOT Vmax:        86.90 cm/s   +------------------+------------++ LVOT Vmean:       60.500 cm/s  +------------------+------------++ LVOT VTI:         0.278 m      +------------------+------------++ LVOT/AV VTI ratio:0.26         +------------------+------------++ AR PHT:           485 msec     +------------------+------------++  +--------------+-------++ AORTA                 +--------------+-------++ Ao Sinus diam:4.10 cm +--------------+-------++ Ao STJ diam:  2.9 cm  +--------------+-------++ Ao Asc diam:  3.70 cm +--------------+-------++  +--------------+----------++ MITRAL VALVE                 +--------------+-------+ +--------------+----------++     SHUNTS                MV Area (PHT):3.33 cm       +--------------+-------+ +--------------+----------++     Systemic VTI: 0.28 m  MV Peak grad: 3.1 mmHg       +--------------+-------+ +--------------+----------++     Systemic Diam:2.70 cm MV Mean grad: 1.0 mmHg       +--------------+-------+ +--------------+----------++ MV Vmax:      0.87 m/s   +--------------+----------++ MV Vmean:     51.0 cm/s  +--------------+----------++ MV VTI:       0.21 m     +--------------+----------++ MV PHT:       66.12 msec +--------------+----------++ MV Decel  Time:228 msec   +--------------+----------++ +----------------+-----------++ MR Peak grad:   206.2 mmHg  +----------------+-----------++ MR Mean grad:   127.0 mmHg  +----------------+-----------++ MR Vmax:        718.00 cm/s +----------------+-----------++ MR Vmean:       523.0 cm/s  +----------------+-----------++ MR PISA:        2.26 cm    +----------------+-----------++ MR PISA Eff ROA:12 mm      +----------------+-----------++ MR PISA Radius: 0.60 cm     +----------------+-----------++ +--------------+----------++ MV E velocity:79.00 cm/s +--------------+----------++ MV A velocity:85.10 cm/s +--------------+----------++ MV E/A ratio: 0.93       +--------------+----------++   Cordella Fix Electronically signed by Cordella Fix Signature  Date/Time: 08/04/2022/8:49:58 PM    Final            Risk Assessment/Calculations:     HYPERTENSION CONTROL Vitals:   05/01/23 1316 05/01/23 1634  BP: (!) 164/92 (!) 158/98    The patient's blood pressure is elevated above target today.  In order to address the patient's elevated BP: A current anti-hypertensive medication was adjusted today.; Blood pressure will be monitored at home to determine if medication changes need to be made.          Physical Exam:   VS:  BP (!) 158/98   Pulse 73   Ht 6' (1.829 m)   Wt 234 lb (106.1 kg)   SpO2 95%   BMI 31.74 kg/m    Wt Readings from Last 3 Encounters:  05/01/23 234 lb (106.1 kg)  11/30/22 231 lb 12.8 oz (105.1 kg)  09/13/22 220 lb (99.8 kg)    GEN: Well nourished, well developed in no acute distress NECK: No JVD; No carotid bruits CARDIAC: RRR, no murmurs, rubs, gallops RESPIRATORY:  Clear to auscultation without rales, wheezing or rhonchi  ABDOMEN: Soft, non-tender, non-distended EXTREMITIES:  No edema; No deformity   ASSESSMENT AND PLAN: .   Bicuspid aortic valve/s/p AVR-this was completed in January 2024, he had a  somewhat complicated recovery requiring a pericardiocentesis and subsequently developed a small pulmonary embolism.  Most recent echo in March 2024 revealed Resilia valve present in the aortic position with normal function.  He denies chest pain, shortness of breath, dizziness, edema.  We discussed SBE, currently he is without dental insurance but he is aware that should he need any dental work completed to contact our office and we will send in SBE for him. Hypertension-currently on lisinopril  10 mg daily, recent kidney function is normal, we will increase his lisinopril  to 20 mg daily.  Continue metoprolol  25 mg twice daily.  He will keep a blood pressure log for 2 weeks, he is currently checking with a wrist monitor, cannot afford an upper arm one,  he plans to validate his wrist monitor with a nurse that he goes to church with.  We discussed repeat BMET however he advised he will see his primary care doctor in approximately 1 week and they will obtain blood work.  Requested that he asked them to send results to us . CAD-mild nonobstructive per left heart cath in preparation for his AVR.  Continue aspirin  81 mg daily, continue Lipitor 20 mg daily, continue Zetia  10 mg daily, continue metoprolol  25 mg twice daily.       Dispo: Increase lisinopril  to 20 mg daily.  Signed, Delon JAYSON Hoover, NP  "

## 2023-04-30 NOTE — Telephone Encounter (Signed)
  Pt c/o BP issue: STAT if pt c/o blurred vision, one-sided weakness or slurred speech  1. What are your last 5 BP readings?  Yesterday - 185/102 This morning 150/97  2. Are you having any other symptoms (ex. Dizziness, headache, blurred vision, passed out)? A little SOB   3. What is your BP issue? Pt said, his BP has been elevated. Yesterday the nurse from his church check his BP and it was 185/102 the nurse also heard a little wheezing below his right breast and saw some swelling on his ankle. He said, BP this morning 150/97. He also added at night his upper number is still around 130-140s and the lowest 128/77. he said, he can go to urgent care today of he needed to but will wait for the nurse to call him back today before he goes

## 2023-04-30 NOTE — Telephone Encounter (Signed)
Called patient and he stated, his BP has been elevated. Yesterday the nurse from his church check his BP and it was 185/102 the nurse also heard a little wheezing below his right breast and saw some swelling on his ankle. He said, BP this morning 150/97. He also added at night his upper number is still around 130-140s and the lowest 128/77. Patient stated that he was thinking about going to the ER. I advised him that he could go to the ER if he felt that was necessary. He stated that he was waiting for his nurse friend to call him back to help him decide whether or not to go to the ER. I explained that with his history and having the high blood pressure that if he felt the need to go to the ER then he should go. I also explained that I would forward this message to Wallis Bamberg for her to see if she had any other options.

## 2023-05-01 ENCOUNTER — Encounter: Payer: Self-pay | Admitting: Cardiology

## 2023-05-01 ENCOUNTER — Ambulatory Visit: Payer: No Typology Code available for payment source | Attending: Cardiology | Admitting: Cardiology

## 2023-05-01 VITALS — BP 158/98 | HR 73 | Ht 72.0 in | Wt 234.0 lb

## 2023-05-01 DIAGNOSIS — I10 Essential (primary) hypertension: Secondary | ICD-10-CM | POA: Diagnosis not present

## 2023-05-01 DIAGNOSIS — E782 Mixed hyperlipidemia: Secondary | ICD-10-CM

## 2023-05-01 DIAGNOSIS — Z952 Presence of prosthetic heart valve: Secondary | ICD-10-CM | POA: Diagnosis not present

## 2023-05-01 DIAGNOSIS — I2510 Atherosclerotic heart disease of native coronary artery without angina pectoris: Secondary | ICD-10-CM

## 2023-05-01 DIAGNOSIS — I350 Nonrheumatic aortic (valve) stenosis: Secondary | ICD-10-CM

## 2023-05-01 MED ORDER — LISINOPRIL 20 MG PO TABS: 20.0000 mg | ORAL_TABLET | Freq: Every day | ORAL | 3 refills | Status: AC

## 2023-05-01 NOTE — Patient Instructions (Addendum)
Medication Instructions:  Your physician has recommended you make the following change in your medication:  Start Lisinopril 20 mg once daily  *If you need a refill on your cardiac medications before your next appointment, please call your pharmacy*   Lab Work: NONE If you have labs (blood work) drawn today and your tests are completely normal, you will receive your results only by: MyChart Message (if you have MyChart) OR A paper copy in the mail If you have any lab test that is abnormal or we need to change your treatment, we will call you to review the results.   Testing/Procedures: NONE   Follow-Up: At [facility], you and your health needs are our priority.  As part of our continuing mission to provide you with exceptional heart care, we have created designated Provider Care Teams.  These Care Teams include your primary Cardiologist (physician) and Advanced Practice Providers (APPs -  Physician Assistants and Nurse Practitioners) who all work together to provide you with the care you need, when you need it.  We recommend signing up for the patient portal called "MyChart".  Sign up information is provided on this After Visit Summary.  MyChart is used to connect with patients for Virtual Visits (Telemedicine).  Patients are able to view lab/test results, encounter notes, upcoming appointments, etc.  Non-urgent messages can be sent to your provider as well.   To learn more about what you can do with MyChart, go to [email or web address].    Your next appointment: 6 Month    Provider:   Norman Herrlich, MD    Other Instructions Keep and record BP for 2 weeks and bring by office

## 2023-05-01 NOTE — Telephone Encounter (Signed)
Patient has an appointment with Craig Alvarez at 1:30 today and is aware of his appointment.

## 2023-05-29 ENCOUNTER — Telehealth: Payer: Self-pay | Admitting: Cardiology

## 2023-05-30 NOTE — Telephone Encounter (Signed)
Informed pt of Woody's comments about BP log. He stated he had been to his PCP since he saw [place]. He will continue to work on losing weight and eating better. Pt verbalized understanding and had no further questions.

## 2023-11-20 ENCOUNTER — Encounter: Payer: Self-pay | Admitting: Cardiology

## 2023-11-20 NOTE — Progress Notes (Signed)
 " Cardiology Office Note:  .   Date:  11/21/2023  ID:  Craig Alvarez, DOB 07-26-1960, MRN [id number] PCP: Thurmond Cathlyn LABOR., MD   [facility] Providers Cardiologist:  Redell Leiter, MD    History of Present Illness: Craig Alvarez   Craig Alvarez is a 63 y.o. male with a past medical history of bicuspid aortic valve s/p AVR 07/2022, pericardial effusion s/p pericardiocentesis 08/2022, hypertension, nonobstructive CAD, history of pulmonary embolism, carotid artery stenosis, dyslipidemia, RLS.  09/12/2022 Limited echo EF 60 to 65%, moderate LVH, LA mild to moderately dilated, no aortic stenosis, Resilia valve present in the aortic position with normal function 08/18/2022 pericardiocentesis 08/04/2022 AVR with 27 mm Inspiris Resilia valve 08/02/2022 carotid ultrasound mild bilateral carotid artery stenosis 07/13/2022 right left heart cath minimal nonobstructive coronary artery disease 04/28/2022 echo EF 50 to 55%, moderate LVH, grade 1 DD on MR, bicuspid aortic valve moderate to severe aortic stenosis with a mean gradient 47.2 mmHg  He established with a care in 2017 for evaluation of a bicuspid aortic valve. He was subsequently lost to follow up until 2023 when his PCP referred him back to [facility] for evaluation of weight gain and shortness of breath.  Echo at that time revealed bicuspid aortic valve with moderate to severe stenosis and gradient indicative of severe aortic stenosis, however there were some financial concerns and treatment was initially delayed.  He ultimately underwent right and left heart cath on 07/13/2022 revealing mild nonobstructive CAD >> AVR on 08/04/2022 with 27 mm Inspira's Resilia valve.  He presented to the emergency department on 08/18/2022 plaints of shortness of breath, he was found to have a large pericardial effusion and underwent urgent pericardiocentesis and subsequently started on colchicine .  He returns to the emergency department on 08/26/2022 for acute right-sided pulmonary embolism, he was  started on Eliquis .  Most recently was evaluated by myself in October 2024, his blood pressure has been steadily increasing and he gained approximately 20 pounds over the past 6 months, we increased his lisinopril  and advised him to follow-up in 6 months.  He presents today for follow-up of his AVR and his nonobstructive CAD.  He has been doing well since last evaluated in our office, he offers no formal complaints.  Recently had lab work by his PCP, revealed that his cholesterol was poorly controlled, he states he has been making poor dietary choices lately.  He is largely returned to his normal activities but he is trying to get disability for orthopedic issues.  He states physically active but he is not participating in any formal exercise.  He states he feels great from a cardiac perspective. He denies chest pain, palpitations, dyspnea, pnd, orthopnea, n, v, dizziness, syncope, edema, weight gain, or early satiety.       EKG Interpretation Date/Time:  Wednesday Nov 21 2023 13:28:19 EDT Ventricular Rate:  68 PR Interval:  152 QRS Duration:  92 QT Interval:  390 QTC Calculation: 414 R Axis:   75  Text Interpretation: Normal sinus rhythm Normal ECG When compared with ECG of 31-Oct-2022 17:35, T wave inversion no longer evident in Inferior leads T wave inversion no longer evident in Lateral leads Confirmed by Carlin Nest [phone]) on 11/21/2023 3:13:04 PM    ROS: Review of Systems  Musculoskeletal:  Positive for back pain and joint pain.  All other systems reviewed and are negative.    Studies Reviewed: .        Cardiac Studies & Procedures   ______________________________________________________________________________________________ CARDIAC CATHETERIZATION  CARDIAC CATHETERIZATION 08/18/2022  Conclusion Successful pericardiocentesis with removal of 900 cc bloody pericardial fluid  Plan: Transfer 2H, monitor drain output, leave to suction   CARDIAC CATHETERIZATION  CARDIAC  CATHETERIZATION 07/13/2022  Conclusion   Mid LM lesion is 20% stenosed.  1.  Minimal obstructive coronary artery disease. 2.  Fick cardiac output of 8.5 L/min and Fick cardiac index of 3.7 L/min/m with mean RA pressure of 9 mmHg, mean wedge pressure of 15 mmHg, and mean PA pressure of 23 mmHg.  Recommendation: Cardiothoracic surgical evaluation.  Findings Coronary Findings Diagnostic  Dominance: Right  Left Main Mid LM lesion is 20% stenosed.  Left Anterior Descending The vessel exhibits minimal luminal irregularities.  Right Coronary Artery The vessel exhibits minimal luminal irregularities.  Intervention  No interventions have been documented.     ECHOCARDIOGRAM  ECHOCARDIOGRAM LIMITED 09/12/2022  Narrative ECHOCARDIOGRAM LIMITED REPORT    Patient Name:   Craig Alvarez Date of Exam: 09/12/2022 Medical Rec #:  [id number]    Height:       72.0 in Accession #:    [id number]   Weight:       217.6 lb Date of Birth:  04-20-1961     BSA:          2.208 m Patient Age:    61 years     BP:           90/60 mmHg Patient Gender: M            HR:           97 bpm. Exam Location:  [place]  Procedure: Cardiac Doppler, Color Doppler and Limited Echo  Indications:    Pericardial effusion [I31.39 (ICD-10-CM)]  History:        Patient has prior history of Echocardiogram examinations, most recent 08/28/2022. CAD, Aortic Valve Disease; Risk Factors:Dyslipidemia and Hypertension. Pericardiocentesis 08/18/2022. Aortic Valve: 27 mm Inspiris Resilia valve is present in the aortic position. Procedure Date: 08/04/2022.  Sonographer:    Charlie Jointer RDCS Referring Phys: [id number] BRIAN J MUNLEY  IMPRESSIONS   1. Left ventricular ejection fraction, by estimation, is 60 to 65%. The left ventricle has normal function. The left ventricle has no regional wall motion abnormalities. There is moderate left ventricular hypertrophy. 2. Right ventricular systolic function is normal. The right  ventricular size is normal. 3. Left atrial size was mild to moderately dilated. 4. The mitral valve is normal in structure. No evidence of mitral valve regurgitation. No evidence of mitral stenosis. 5. The aortic valve is normal in structure. Aortic valve regurgitation is not visualized. No aortic stenosis is present. There is a 27 mm Inspiris Resilia valve present in the aortic position. Procedure Date: 08/04/2022. Normal function. 6. The inferior vena cava is normal in size with greater than 50% respiratory variability, suggesting right atrial pressure of 3 mmHg.  FINDINGS Left Ventricle: Left ventricular ejection fraction, by estimation, is 60 to 65%. The left ventricle has normal function. The left ventricle has no regional wall motion abnormalities. The left ventricular internal cavity size was normal in size. There is moderate left ventricular hypertrophy.  Right Ventricle: The right ventricular size is normal. No increase in right ventricular wall thickness. Right ventricular systolic function is normal.  Left Atrium: Left atrial size was mild to moderately dilated.  Right Atrium: Right atrial size was normal in size.  Pericardium: There is no evidence of pericardial effusion.  Mitral Valve: The mitral valve is normal in structure. No evidence of mitral valve  stenosis.  Tricuspid Valve: The tricuspid valve is normal in structure. Tricuspid valve regurgitation is not demonstrated. No evidence of tricuspid stenosis.  Aortic Valve: The aortic valve is normal in structure. Aortic valve regurgitation is not visualized. No aortic stenosis is present. Aortic valve mean gradient measures 8.0 mmHg. Aortic valve peak gradient measures 13.0 mmHg. Aortic valve area, by VTI measures 3.53 cm. There is a 27 mm Inspiris Resilia valve present in the aortic position. Procedure Date: 08/04/2022.  Pulmonic Valve: The pulmonic valve was normal in structure. Pulmonic valve regurgitation is not visualized.  No evidence of pulmonic stenosis.  Aorta: The aortic root is normal in size and structure.  Venous: The inferior vena cava is normal in size with greater than 50% respiratory variability, suggesting right atrial pressure of 3 mmHg.  IAS/Shunts: No atrial level shunt detected by color flow Doppler.  LEFT VENTRICLE PLAX 2D LVIDd:         5.20 cm   Diastology LVIDs:         3.00 cm   LV e' medial:    6.37 cm/s LV PW:         1.50 cm   LV E/e' medial:  10.6 LV IVS:        1.90 cm   LV e' lateral:   9.95 cm/s LVOT diam:     2.70 cm   LV E/e' lateral: 6.8 LV SV:         106 LV SV Index:   48 LVOT Area:     5.73 cm   RIGHT VENTRICLE            IVC RV Basal diam:  3.60 cm    IVC diam: 2.00 cm RV Mid diam:    3.10 cm RV S prime:     7.77 cm/s TAPSE (M-mode): 0.9 cm  LEFT ATRIUM             Index        RIGHT ATRIUM           Index LA diam:        4.50 cm 2.04 cm/m   RA Area:     12.30 cm LA Vol (A2C):   40.9 ml 18.52 ml/m  RA Volume:   26.40 ml  11.95 ml/m LA Vol (A4C):   47.7 ml 21.60 ml/m LA Biplane Vol: 46.2 ml 20.92 ml/m AORTIC VALVE AV Area (Vmax):    3.31 cm AV Area (Vmean):   3.55 cm AV Area (VTI):     3.53 cm AV Vmax:           180.00 cm/s AV Vmean:          134.000 cm/s AV VTI:            0.300 m AV Peak Grad:      13.0 mmHg AV Mean Grad:      8.0 mmHg LVOT Vmax:         104.00 cm/s LVOT Vmean:        83.200 cm/s LVOT VTI:          0.185 m LVOT/AV VTI ratio: 0.62  AORTA Ao Root diam: 3.80 cm Ao Asc diam:  3.40 cm Ao Desc diam: 2.10 cm  MITRAL VALVE MV Area (PHT): 3.46 cm    SHUNTS MV Decel Time: 219 msec    Systemic VTI:  0.18 m MV E velocity: 67.40 cm/s  Systemic Diam: 2.70 cm MV A velocity: 89.40 cm/s MV E/A ratio:  0.75  Aijah Lattner Crape MD Electronically signed by Presley Summerlin Crape MD Signature Date/Time: 09/12/2022/12:57:51 PM    Final   TEE  ECHO INTRAOPERATIVE TEE 08/04/2022  Narrative *INTRAOPERATIVE TRANSESOPHAGEAL REPORT  *    Patient Name:   KONSTANTINE GERVASI Date of Exam: 08/04/2022 Medical Rec #:  [id number]    Height:       72.0 in Accession #:    [id number]   Weight:       235.0 lb Date of Birth:  1960/12/19     BSA:          2.28 m Patient Age:    61 years     BP:           120/69 mmHg Patient Gender: M            HR:           68 bpm. Exam Location:  Inpatient  Transesophogeal exam was perform intraoperatively during surgical procedure. Patient was closely monitored under general anesthesia during the entirety of examination.  Indications:     aortic valve replacement Performing Phys: [id number] BRYAN K BARTLE Diagnosing Phys: Cordella Stoltzfus  Complications: No known complications during this procedure. POST-OP IMPRESSIONS _ Left Ventricle: The left ventricle is unchanged from pre-bypass. _ Right Ventricle: The right ventricle appears unchanged from pre-bypass. _ Aorta: The aorta appears unchanged from pre-bypass. _ Aortic Valve: A bioprosthetic valve was placed, leaflets are freely mobile Size; 27mm. No regurgitation post replacement. The gradient recorded across the prosthetic valve is within the expected range. No perivalvular leak noted.mean PG . _ Mitral Valve: There is mild regurgitation. _ Tricuspid Valve: The tricuspid valve appears unchanged from pre-bypass. _ Pulmonic Valve: The pulmonic valve appears unchanged from pre-bypass. _ Interatrial Septum: The interatrial septum appears unchanged from pre-bypass. _ Pericardium: The pericardium appears unchanged from pre-bypass.  PRE-OP FINDINGS Left Ventricle: The left ventricle has normal systolic function, with an ejection fraction of 55-60%. The cavity size was normal. No evidence of left ventricular regional wall motion abnormalities. There is moderate concentric left ventricular hypertrophy. Left ventricular diastolic parameters were normal.   Right Ventricle: The right ventricle has normal systolic function. The cavity was normal.  There is no increase in right ventricular wall thickness. Right ventricular systolic pressure is normal.  Left Atrium: Left atrial size was normal in size. No left atrial/left atrial appendage thrombus was detected. Left atrial appendage velocity is normal at greater than 40 cm/s.  Right Atrium: Right atrial size was normal in size. Prominent Eustachian valve.  Interatrial Septum: No atrial level shunt detected by color flow Doppler. The interatrial septum appears to be lipomatous. There is no evidence of a patent foramen ovale.  Pericardium: There is no evidence of pericardial effusion. There is no pleural effusion.  Mitral Valve: The mitral valve is normal in structure. Mitral valve regurgitation mild to moderate. The MR jet is centrally-directed. There is no evidence of mitral valve vegetation. Pulmonary venous flow is blunted (decreased). There is mild mitral valve regurgitation by PISA measuring 2.26. There is No evidence of mitral stenosis. A2/P2 poor coaptation. PISA radius .6cm, EROA .12cm2, RV 32cc.  Tricuspid Valve: The tricuspid valve was normal in structure. Tricuspid valve regurgitation is mild by color flow Doppler. No evidence of tricuspid stenosis is present. There is no evidence of tricuspid valve vegetation.  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 The jet is posteriorly-directed. There is severe stenosis of the aortic valve, with a  calculated valve area of 1.48 cm. There is no evidence of aortic valve vegetation. There is moderate thickening and severe calcifcation present on the aortic valve left coronary and right coronary cusps with severely decreased mobility.  Pulmonic Valve: The pulmonic valve was normal in structure, with normal. No evidence of pumonic stenosis. Pulmonic valve regurgitation is trivial by color flow Doppler.   Aorta: The aortic root and ascending aorta are normal in size and structure.  Pulmonary  Artery: Norva Purl catheter present on the right. The pulmonary artery is of normal size.  Shunts: There is a small secundum atrial septal defect with predominantly left to right shunting across the atrial septum.  +--------------+--------++ LEFT VENTRICLE          +----------------+---------++ +--------------+--------++  Diastology                PLAX 2D                 +----------------+---------++ +--------------+--------++  LV e' lateral:  7.82 cm/s LV PW:        1.40 cm   +----------------+---------++ +--------------+--------++  LV E/e' lateral:10.1      LV IVS:       1.70 cm   +----------------+---------++ +--------------+--------++  LV e' medial:   7.20 cm/s LVOT diam:    2.70 cm   +----------------+---------++ +--------------+--------++  LV E/e' medial: 11.0      LVOT Area:    5.73 cm  +----------------+---------++ +--------------+--------++                         +----------------------+-------++ +--------------+--------++  2D Longitudinal Strain        +----------------------+-------++ 2D Strain GLS (A2C):  -13.9 % +----------------------+-------++ 2D Strain GLS (A3C):  -11.1 % +----------------------+-------++ 2D Strain GLS (A4C):  -9.9 %  +----------------------+-------++ 2D Strain GLS Avg:    -11.6 % +----------------------+-------++  +------------+---------++ 3D Volume EF          +------------+---------++ LV 3D EDV:  126.46 ml +------------+---------++ LV 3D ESV:  54.22 ml  +------------+---------++  +---------------+------+-------+ RIGHT VENTRICLE              +---------------+------+-------+ TAPSE (M-mode):1.6 cm2.37 cm +---------------+------+-------+  +------------------+------------++ AORTIC VALVE                   +------------------+------------++ AV Area (Vmax):   1.28 cm     +------------------+------------++ AV Area (Vmean):  1.29 cm      +------------------+------------++ AV Area (VTI):    1.48 cm     +------------------+------------++ AV Vmax:          390.00 cm/s  +------------------+------------++ AV Vmean:         267.500 cm/s +------------------+------------++ AV VTI:           1.075 m      +------------------+------------++ AV Peak Grad:     60.8 mmHg    +------------------+------------++ AV Mean Grad:     33.5 mmHg    +------------------+------------++ LVOT Vmax:        86.90 cm/s   +------------------+------------++ LVOT Vmean:       60.500 cm/s  +------------------+------------++ LVOT VTI:         0.278 m      +------------------+------------++ LVOT/AV VTI ratio:0.26         +------------------+------------++ AR PHT:           485 msec     +------------------+------------++  +--------------+-------++ AORTA                 +--------------+-------++  Ao Sinus diam:4.10 cm +--------------+-------++ Ao STJ diam:  2.9 cm  +--------------+-------++ Ao Asc diam:  3.70 cm +--------------+-------++  +--------------+----------++ MITRAL VALVE                 +--------------+-------+ +--------------+----------++     SHUNTS                MV Area (PHT):3.33 cm       +--------------+-------+ +--------------+----------++     Systemic VTI: 0.28 m  MV Peak grad: 3.1 mmHg       +--------------+-------+ +--------------+----------++     Systemic Diam:2.70 cm MV Mean grad: 1.0 mmHg       +--------------+-------+ +--------------+----------++ MV Vmax:      0.87 m/s   +--------------+----------++ MV Vmean:     51.0 cm/s  +--------------+----------++ MV VTI:       0.21 m     +--------------+----------++ MV PHT:       66.12 msec +--------------+----------++ MV Decel Time:228 msec   +--------------+----------++ +----------------+-----------++ MR Peak grad:   206.2 mmHg   +----------------+-----------++ MR Mean grad:   127.0 mmHg  +----------------+-----------++ MR Vmax:        718.00 cm/s +----------------+-----------++ MR Vmean:       523.0 cm/s  +----------------+-----------++ MR PISA:        2.26 cm    +----------------+-----------++ MR PISA Eff ROA:12 mm      +----------------+-----------++ MR PISA Radius: 0.60 cm     +----------------+-----------++ +--------------+----------++ MV E velocity:79.00 cm/s +--------------+----------++ MV A velocity:85.10 cm/s +--------------+----------++ MV E/A ratio: 0.93       +--------------+----------++   Cordella Fix Electronically signed by Cordella Fix Signature Date/Time: 08/04/2022/8:49:58 PM    Final        ______________________________________________________________________________________________      Risk Assessment/Calculations:             Physical Exam:   VS:  BP 120/84   Pulse 68   Ht 6' (1.829 m)   Wt 235 lb 12.8 oz (107 kg)   SpO2 97%   BMI 31.98 kg/m    Wt Readings from Last 3 Encounters:  11/21/23 235 lb 12.8 oz (107 kg)  05/01/23 234 lb (106.1 kg)  11/30/22 231 lb 12.8 oz (105.1 kg)    GEN: Well nourished, well developed in no acute distress NECK: No JVD; No carotid bruits CARDIAC: RRR, no murmurs, rubs, gallops RESPIRATORY:  Clear to auscultation without rales, wheezing or rhonchi  ABDOMEN: Soft, non-tender, non-distended EXTREMITIES:  No edema; No deformity   ASSESSMENT AND PLAN: .   Bicuspid aortic valve/s/p AVR-this was completed in January 2024, he had a somewhat complicated recovery requiring a pericardiocentesis and subsequently developed a small pulmonary embolism.  Most recent echo in March 2024 revealed Resilia valve present in the aortic position with normal function.  We discussed repeating echocardiogram however he feels good, wants to defer till his next appointment with his primary cardiologist.  He  denies chest pain, shortness of breath, dizziness, edema.  SBE is arranged and he is he is aware he needs this prior to any dental procedures/cleanings.  Hypertension-his blood pressure is well-controlled today at 120/84 however reviewing his medication list he is on lisinopril  and quinapril, he is not entirely sure which when he is taking.  At her last office visit we increase his lisinopril  from 10 mg to 20 mg.  He is also on Norvasc  5 mg daily.  He will go home and call us  and give us  an updated medication list.  Likely if he is taking both we will stop his quinapril, and make adjustments to his lisinopril .  Lab work by PCP last week revealed creatinine 0.97, and potassium of 5.  Dyslipidemia-most recent LDL is elevated at 132 and LFTs were normal last week, we discussed about making lifestyle changes, he obviously needs to work on losing weight and be more physically active however this might be challenging for him as he is dealing with ongoing back pain as well as knee pain, working on a disability claim.  I think for now we need to increase his Lipitor to 40 mg, repeat FLP and LFTs in 6 weeks.  CAD-mild nonobstructive per left heart cath in preparation for his AVR.  Continue aspirin  81 mg daily, continue Lipitor but increasing to 40 mg daily, continue Zetia  10 mg daily, continue metoprolol  25 mg twice daily.       Dispo: Increase Lipitor to 40 mg each evening, repeat FLP and LFTs in 6 weeks.  Contact our office in let us  know specifically what blood pressure medications were taking.  Follow-up with general cardiology in 9 months.  Signed, Delon JAYSON Hoover, NP  "

## 2023-11-21 ENCOUNTER — Ambulatory Visit: Admitting: Cardiology

## 2023-11-21 ENCOUNTER — Ambulatory Visit: Attending: Cardiology | Admitting: Cardiology

## 2023-11-21 ENCOUNTER — Encounter: Payer: Self-pay | Admitting: Cardiology

## 2023-11-21 VITALS — BP 120/84 | HR 68 | Ht 72.0 in | Wt 235.8 lb

## 2023-11-21 DIAGNOSIS — E782 Mixed hyperlipidemia: Secondary | ICD-10-CM | POA: Diagnosis present

## 2023-11-21 DIAGNOSIS — Z952 Presence of prosthetic heart valve: Secondary | ICD-10-CM | POA: Diagnosis present

## 2023-11-21 DIAGNOSIS — I10 Essential (primary) hypertension: Secondary | ICD-10-CM

## 2023-11-21 DIAGNOSIS — I350 Nonrheumatic aortic (valve) stenosis: Secondary | ICD-10-CM | POA: Diagnosis present

## 2023-11-21 DIAGNOSIS — I2510 Atherosclerotic heart disease of native coronary artery without angina pectoris: Secondary | ICD-10-CM | POA: Diagnosis present

## 2023-11-21 MED ORDER — ATORVASTATIN CALCIUM 40 MG PO TABS: 40.0000 mg | ORAL_TABLET | Freq: Every day | ORAL | 3 refills | Status: AC

## 2023-11-21 NOTE — Patient Instructions (Addendum)
 Medication Instructions:  Your physician has recommended you make the following change in your medication:   START: Lipitor 40 mg daily  *If you need a refill on your cardiac medications before your next appointment, please call your pharmacy*  Lab Work: Your physician recommends that you return for lab work in:   Labs in 6 weeks: CMP,Lipid  If you have labs (blood work) drawn today and your tests are completely normal, you will receive your results only by: MyChart Message (if you have MyChart) OR A paper copy in the mail If you have any lab test that is abnormal or we need to change your treatment, we will call you to review the results.  Testing/Procedures: None  Follow-Up: At [facility], you and your health needs are our priority.  As part of our continuing mission to provide you with exceptional heart care, our providers are all part of one team.  This team includes your primary Cardiologist (physician) and Advanced Practice Providers or APPs (Physician Assistants and Nurse Practitioners) who all work together to provide you with the care you need, when you need it.  Your next appointment:   9 month(s)  Provider:   Zoe Hinds, MD    We recommend signing up for the patient portal called "MyChart".  Sign up information is provided on this After Visit Summary.  MyChart is used to connect with patients for Virtual Visits (Telemedicine).  Patients are able to view lab/test results, encounter notes, upcoming appointments, etc.  Non-urgent messages can be sent to your provider as well.   To learn more about what you can do with MyChart, go to [email or web address].   Other Instructions Please call us  when you get home to review your medication list.

## 2023-11-22 ENCOUNTER — Telehealth: Payer: Self-pay | Admitting: Cardiology

## 2023-11-22 NOTE — Telephone Encounter (Signed)
Patient is calling to follow up. Please advise.

## 2023-11-22 NOTE — Telephone Encounter (Signed)
 Pt c/o medication issue:  1. Name of Medication:   lisinopril  (ZESTRIL ) 20 MG tablet (Expired)  metoprolol  tartrate (LOPRESSOR ) 25 MG tablet (Expired)   2. How are you currently taking this medication (dosage and times per day)?   As prescribed  3. Are you having a reaction (difficulty breathing--STAT)?   4. What is your medication issue?    Patient called to report as requested on the medications he is still taking.

## 2023-11-22 NOTE — Telephone Encounter (Signed)
 Spoke with pt and advised that [facility] has not reviewed at this time to make recommendations. Pt verbalized understanding and had no additional questions.

## 2023-11-27 NOTE — Telephone Encounter (Signed)
 Patient says he is returning a call regarding this matter. Please advise.

## 2023-11-27 NOTE — Telephone Encounter (Signed)
 Recommendations reviewed with pt as per Pattricia Bores, NP's note.  Pt verbalized understanding and had no additional questions.  Med list reconciled

## 2023-12-15 LAB — COMPREHENSIVE METABOLIC PANEL WITH GFR
ALT: 45 IU/L — ABNORMAL HIGH (ref 0–44)
AST: 30 IU/L (ref 0–40)
Albumin: 4.9 g/dL (ref 3.9–4.9)
Alkaline Phosphatase: 73 IU/L (ref 44–121)
BUN/Creatinine Ratio: 22 (ref 10–24)
BUN: 27 mg/dL (ref 8–27)
Bilirubin Total: 1.1 mg/dL (ref 0.0–1.2)
CO2: 19 mmol/L — ABNORMAL LOW (ref 20–29)
Calcium: 9.9 mg/dL (ref 8.6–10.2)
Chloride: 104 mmol/L (ref 96–106)
Creatinine, Ser: 1.21 mg/dL (ref 0.76–1.27)
Globulin, Total: 1.9 g/dL (ref 1.5–4.5)
Glucose: 75 mg/dL (ref 70–99)
Potassium: 4.4 mmol/L (ref 3.5–5.2)
Sodium: 141 mmol/L (ref 134–144)
Total Protein: 6.8 g/dL (ref 6.0–8.5)
eGFR: 68 mL/min/1.73

## 2023-12-15 LAB — LIPID PANEL
Chol/HDL Ratio: 3.2 ratio (ref 0.0–5.0)
Cholesterol, Total: 131 mg/dL (ref 100–199)
HDL: 41 mg/dL
LDL Chol Calc (NIH): 69 mg/dL (ref 0–99)
Triglycerides: 115 mg/dL (ref 0–149)
VLDL Cholesterol Cal: 21 mg/dL (ref 5–40)

## 2023-12-16 ENCOUNTER — Other Ambulatory Visit: Payer: Self-pay | Admitting: Cardiology

## 2023-12-16 ENCOUNTER — Ambulatory Visit: Payer: Self-pay | Admitting: Cardiology
